# Patient Record
Sex: Female | Born: 1970 | Race: White | Hispanic: No | Smoking: Former smoker
Health system: Southern US, Community
[De-identification: ages and names within clinical notes are randomized; demographics above are authoritative.]

## PROBLEM LIST (undated history)

## (undated) DIAGNOSIS — F419 Anxiety disorder, unspecified: Secondary | ICD-10-CM

## (undated) DIAGNOSIS — Z8619 Personal history of other infectious and parasitic diseases: Secondary | ICD-10-CM

## (undated) DIAGNOSIS — M1990 Unspecified osteoarthritis, unspecified site: Secondary | ICD-10-CM

## (undated) DIAGNOSIS — T4145XA Adverse effect of unspecified anesthetic, initial encounter: Secondary | ICD-10-CM

## (undated) DIAGNOSIS — K219 Gastro-esophageal reflux disease without esophagitis: Secondary | ICD-10-CM

## (undated) DIAGNOSIS — D649 Anemia, unspecified: Secondary | ICD-10-CM

## (undated) DIAGNOSIS — N3090 Cystitis, unspecified without hematuria: Secondary | ICD-10-CM

## (undated) DIAGNOSIS — R87619 Unspecified abnormal cytological findings in specimens from cervix uteri: Secondary | ICD-10-CM

## (undated) DIAGNOSIS — K635 Polyp of colon: Secondary | ICD-10-CM

## (undated) DIAGNOSIS — K449 Diaphragmatic hernia without obstruction or gangrene: Secondary | ICD-10-CM

## (undated) DIAGNOSIS — G519 Disorder of facial nerve, unspecified: Secondary | ICD-10-CM

## (undated) DIAGNOSIS — T8859XA Other complications of anesthesia, initial encounter: Secondary | ICD-10-CM

## (undated) DIAGNOSIS — J189 Pneumonia, unspecified organism: Secondary | ICD-10-CM

## (undated) HISTORY — DX: Polyp of colon: K63.5

## (undated) HISTORY — DX: Cystitis, unspecified without hematuria: N30.90

## (undated) HISTORY — PX: LAPAROSCOPIC ABDOMINAL EXPLORATION: SHX6249

## (undated) HISTORY — DX: Anemia, unspecified: D64.9

## (undated) HISTORY — DX: Personal history of other infectious and parasitic diseases: Z86.19

## (undated) HISTORY — DX: Diaphragmatic hernia without obstruction or gangrene: K44.9

## (undated) HISTORY — PX: GYNECOLOGIC CRYOSURGERY: SHX857

## (undated) HISTORY — DX: Disorder of facial nerve, unspecified: G51.9

## (undated) HISTORY — DX: Unspecified abnormal cytological findings in specimens from cervix uteri: R87.619

---

## 2000-04-15 ENCOUNTER — Emergency Department (HOSPITAL_COMMUNITY): Admission: EM | Admit: 2000-04-15 | Discharge: 2000-04-16 | Payer: Self-pay | Admitting: Emergency Medicine

## 2003-02-20 ENCOUNTER — Emergency Department (HOSPITAL_COMMUNITY): Admission: EM | Admit: 2003-02-20 | Discharge: 2003-02-21 | Payer: Self-pay | Admitting: Emergency Medicine

## 2003-05-13 ENCOUNTER — Emergency Department (HOSPITAL_COMMUNITY): Admission: EM | Admit: 2003-05-13 | Discharge: 2003-05-13 | Payer: Self-pay | Admitting: Emergency Medicine

## 2003-05-13 ENCOUNTER — Encounter: Payer: Self-pay | Admitting: Emergency Medicine

## 2003-11-27 ENCOUNTER — Other Ambulatory Visit: Admission: RE | Admit: 2003-11-27 | Discharge: 2003-11-27 | Payer: Self-pay | Admitting: Obstetrics and Gynecology

## 2006-11-02 ENCOUNTER — Ambulatory Visit (HOSPITAL_COMMUNITY): Admission: RE | Admit: 2006-11-02 | Discharge: 2006-11-02 | Payer: Self-pay | Admitting: Family Medicine

## 2007-07-30 ENCOUNTER — Encounter: Admission: RE | Admit: 2007-07-30 | Discharge: 2007-07-30 | Payer: Self-pay | Admitting: Family Medicine

## 2007-10-29 ENCOUNTER — Encounter: Admission: RE | Admit: 2007-10-29 | Discharge: 2007-10-29 | Payer: Self-pay | Admitting: Otolaryngology

## 2008-10-03 ENCOUNTER — Ambulatory Visit: Payer: Self-pay | Admitting: Diagnostic Radiology

## 2008-10-03 ENCOUNTER — Emergency Department (HOSPITAL_BASED_OUTPATIENT_CLINIC_OR_DEPARTMENT_OTHER): Admission: EM | Admit: 2008-10-03 | Discharge: 2008-10-03 | Payer: Self-pay | Admitting: Emergency Medicine

## 2008-12-31 ENCOUNTER — Emergency Department (HOSPITAL_BASED_OUTPATIENT_CLINIC_OR_DEPARTMENT_OTHER): Admission: EM | Admit: 2008-12-31 | Discharge: 2009-01-01 | Payer: Self-pay | Admitting: Emergency Medicine

## 2010-04-11 ENCOUNTER — Emergency Department (HOSPITAL_BASED_OUTPATIENT_CLINIC_OR_DEPARTMENT_OTHER): Admission: EM | Admit: 2010-04-11 | Discharge: 2010-04-11 | Payer: Self-pay | Admitting: Emergency Medicine

## 2010-08-18 ENCOUNTER — Encounter: Payer: Self-pay | Admitting: Family Medicine

## 2010-10-10 LAB — URINALYSIS, ROUTINE W REFLEX MICROSCOPIC
Bilirubin Urine: NEGATIVE
Nitrite: NEGATIVE
Specific Gravity, Urine: 1.01 (ref 1.005–1.030)
Urobilinogen, UA: 0.2 mg/dL (ref 0.0–1.0)

## 2010-10-10 LAB — CBC
HCT: 39.4 % (ref 36.0–46.0)
MCHC: 35.7 g/dL (ref 30.0–36.0)
MCV: 90.4 fL (ref 78.0–100.0)
RDW: 12.1 % (ref 11.5–15.5)

## 2010-10-10 LAB — BASIC METABOLIC PANEL
BUN: 8 mg/dL (ref 6–23)
GFR calc non Af Amer: >60 mL/min (ref >60)
Glucose, Bld: 91 mg/dL (ref 70–99)
Potassium: 4.1 mEq/L (ref 3.5–5.1)

## 2010-10-10 LAB — DIFFERENTIAL
Basophils Absolute: 0.1 10*3/uL (ref 0.0–0.1)
Basophils Relative: 1 % (ref 0–1)
Eosinophils Absolute: 0.1 10*3/uL (ref 0.0–0.7)
Eosinophils Relative: 1 % (ref 0–5)
Monocytes Absolute: 0.4 10*3/uL (ref 0.1–1.0)

## 2010-10-10 LAB — PREGNANCY, URINE: Preg Test, Ur: NEGATIVE

## 2010-10-10 LAB — URINE MICROSCOPIC-ADD ON

## 2010-11-07 LAB — CBC
HCT: 38.8 % (ref 36.0–46.0)
MCV: 91.2 fL (ref 78.0–100.0)
RBC: 4.25 MIL/uL (ref 3.87–5.11)
WBC: 6.3 10*3/uL (ref 4.0–10.5)

## 2010-11-07 LAB — URINALYSIS, ROUTINE W REFLEX MICROSCOPIC
Bilirubin Urine: NEGATIVE
Protein, ur: NEGATIVE mg/dL
Urobilinogen, UA: 0.2 mg/dL (ref 0.0–1.0)

## 2010-11-07 LAB — WET PREP, GENITAL
Clue Cells Wet Prep HPF POC: NONE SEEN
Yeast Wet Prep HPF POC: NONE SEEN

## 2010-11-07 LAB — RPR: RPR Ser Ql: NONREACTIVE

## 2010-11-07 LAB — GC/CHLAMYDIA PROBE AMP, GENITAL: GC Probe Amp, Genital: NEGATIVE

## 2010-11-18 ENCOUNTER — Other Ambulatory Visit: Payer: Self-pay | Admitting: Family Medicine

## 2010-11-18 DIAGNOSIS — Z1231 Encounter for screening mammogram for malignant neoplasm of breast: Secondary | ICD-10-CM

## 2010-12-10 ENCOUNTER — Ambulatory Visit: Payer: Self-pay

## 2011-01-26 ENCOUNTER — Emergency Department (HOSPITAL_BASED_OUTPATIENT_CLINIC_OR_DEPARTMENT_OTHER)
Admission: EM | Admit: 2011-01-26 | Discharge: 2011-01-26 | Disposition: A | Discharge status: Home / Self Care | Payer: BC Managed Care – PPO | Attending: Emergency Medicine | Admitting: Emergency Medicine

## 2011-01-26 ENCOUNTER — Emergency Department (INDEPENDENT_AMBULATORY_CARE_PROVIDER_SITE_OTHER): Payer: BC Managed Care – PPO

## 2011-01-26 DIAGNOSIS — S93609A Unspecified sprain of unspecified foot, initial encounter: Secondary | ICD-10-CM | POA: Insufficient documentation

## 2011-01-26 DIAGNOSIS — X500XXA Overexertion from strenuous movement or load, initial encounter: Secondary | ICD-10-CM | POA: Insufficient documentation

## 2011-01-26 DIAGNOSIS — Y92009 Unspecified place in unspecified non-institutional (private) residence as the place of occurrence of the external cause: Secondary | ICD-10-CM | POA: Insufficient documentation

## 2011-01-26 DIAGNOSIS — M25579 Pain in unspecified ankle and joints of unspecified foot: Secondary | ICD-10-CM

## 2012-05-19 ENCOUNTER — Other Ambulatory Visit: Payer: Self-pay | Admitting: Family Medicine

## 2012-05-19 DIAGNOSIS — Z1231 Encounter for screening mammogram for malignant neoplasm of breast: Secondary | ICD-10-CM

## 2012-05-24 ENCOUNTER — Ambulatory Visit
Admission: RE | Admit: 2012-05-24 | Discharge: 2012-05-24 | Disposition: A | Discharge status: Home / Self Care | Payer: BC Managed Care – PPO | Source: Ambulatory Visit | Attending: Family Medicine | Admitting: Family Medicine

## 2012-05-24 ENCOUNTER — Other Ambulatory Visit: Payer: Self-pay | Admitting: Physician Assistant

## 2012-05-24 DIAGNOSIS — R9230 Dense breasts, unspecified: Secondary | ICD-10-CM

## 2012-05-24 DIAGNOSIS — Z1231 Encounter for screening mammogram for malignant neoplasm of breast: Secondary | ICD-10-CM

## 2012-05-24 DIAGNOSIS — IMO0002 Reserved for concepts with insufficient information to code with codable children: Secondary | ICD-10-CM

## 2012-05-24 DIAGNOSIS — R922 Inconclusive mammogram: Secondary | ICD-10-CM

## 2012-06-01 ENCOUNTER — Other Ambulatory Visit: Payer: Self-pay | Admitting: Physician Assistant

## 2012-06-01 ENCOUNTER — Ambulatory Visit
Admission: RE | Admit: 2012-06-01 | Discharge: 2012-06-01 | Disposition: A | Discharge status: Home / Self Care | Payer: BC Managed Care – PPO | Source: Ambulatory Visit | Attending: Physician Assistant | Admitting: Physician Assistant

## 2012-06-01 DIAGNOSIS — IMO0002 Reserved for concepts with insufficient information to code with codable children: Secondary | ICD-10-CM

## 2012-07-28 HISTORY — PX: BREAST BIOPSY: SHX20

## 2012-07-28 HISTORY — PX: CERVICAL DISCECTOMY: SHX98

## 2012-10-21 ENCOUNTER — Other Ambulatory Visit: Payer: Self-pay | Admitting: Family Medicine

## 2012-10-21 DIAGNOSIS — N6314 Unspecified lump in the right breast, lower inner quadrant: Secondary | ICD-10-CM

## 2012-11-03 ENCOUNTER — Ambulatory Visit
Admission: RE | Admit: 2012-11-03 | Discharge: 2012-11-03 | Disposition: A | Discharge status: Home / Self Care | Payer: BC Managed Care – PPO | Source: Ambulatory Visit | Attending: Family Medicine | Admitting: Family Medicine

## 2012-11-03 DIAGNOSIS — N6314 Unspecified lump in the right breast, lower inner quadrant: Secondary | ICD-10-CM

## 2012-11-08 ENCOUNTER — Other Ambulatory Visit: Payer: Self-pay | Admitting: Physical Medicine and Rehabilitation

## 2012-11-08 DIAGNOSIS — M542 Cervicalgia: Secondary | ICD-10-CM

## 2012-11-09 ENCOUNTER — Ambulatory Visit
Admission: RE | Admit: 2012-11-09 | Discharge: 2012-11-09 | Disposition: A | Discharge status: Home / Self Care | Payer: BC Managed Care – PPO | Source: Ambulatory Visit | Attending: Physical Medicine and Rehabilitation | Admitting: Physical Medicine and Rehabilitation

## 2012-11-09 DIAGNOSIS — M542 Cervicalgia: Secondary | ICD-10-CM

## 2013-05-05 ENCOUNTER — Other Ambulatory Visit: Payer: Self-pay | Admitting: Family Medicine

## 2013-05-05 DIAGNOSIS — Z1231 Encounter for screening mammogram for malignant neoplasm of breast: Secondary | ICD-10-CM

## 2013-05-23 ENCOUNTER — Ambulatory Visit: Payer: BC Managed Care – PPO

## 2013-06-05 ENCOUNTER — Emergency Department (HOSPITAL_BASED_OUTPATIENT_CLINIC_OR_DEPARTMENT_OTHER)
Admission: EM | Admit: 2013-06-05 | Discharge: 2013-06-05 | Disposition: A | Discharge status: Home / Self Care | Payer: BC Managed Care – PPO | Attending: Emergency Medicine | Admitting: Emergency Medicine

## 2013-06-05 ENCOUNTER — Encounter (HOSPITAL_BASED_OUTPATIENT_CLINIC_OR_DEPARTMENT_OTHER): Payer: Self-pay | Admitting: Emergency Medicine

## 2013-06-05 DIAGNOSIS — J3489 Other specified disorders of nose and nasal sinuses: Secondary | ICD-10-CM | POA: Insufficient documentation

## 2013-06-05 DIAGNOSIS — R059 Cough, unspecified: Secondary | ICD-10-CM | POA: Insufficient documentation

## 2013-06-05 DIAGNOSIS — H579 Unspecified disorder of eye and adnexa: Secondary | ICD-10-CM | POA: Insufficient documentation

## 2013-06-05 DIAGNOSIS — L309 Dermatitis, unspecified: Secondary | ICD-10-CM

## 2013-06-05 DIAGNOSIS — R509 Fever, unspecified: Secondary | ICD-10-CM | POA: Insufficient documentation

## 2013-06-05 DIAGNOSIS — F411 Generalized anxiety disorder: Secondary | ICD-10-CM | POA: Insufficient documentation

## 2013-06-05 DIAGNOSIS — M542 Cervicalgia: Secondary | ICD-10-CM | POA: Insufficient documentation

## 2013-06-05 DIAGNOSIS — L259 Unspecified contact dermatitis, unspecified cause: Secondary | ICD-10-CM | POA: Insufficient documentation

## 2013-06-05 DIAGNOSIS — F17200 Nicotine dependence, unspecified, uncomplicated: Secondary | ICD-10-CM | POA: Insufficient documentation

## 2013-06-05 DIAGNOSIS — R110 Nausea: Secondary | ICD-10-CM | POA: Insufficient documentation

## 2013-06-05 DIAGNOSIS — R05 Cough: Secondary | ICD-10-CM | POA: Insufficient documentation

## 2013-06-05 DIAGNOSIS — K219 Gastro-esophageal reflux disease without esophagitis: Secondary | ICD-10-CM | POA: Insufficient documentation

## 2013-06-05 DIAGNOSIS — R51 Headache: Secondary | ICD-10-CM | POA: Insufficient documentation

## 2013-06-05 HISTORY — DX: Anxiety disorder, unspecified: F41.9

## 2013-06-05 MED ORDER — PREDNISONE 20 MG PO TABS
40.0000 mg | ORAL_TABLET | Freq: Once | ORAL | Status: AC
  Administered 2013-06-05: 40 mg via ORAL
  Filled 2013-06-05: qty 2

## 2013-06-05 MED ORDER — PREDNISONE 10 MG PO TABS: 40.0000 mg | ORAL_TABLET | Freq: Every day | ORAL | Status: DC

## 2013-06-05 NOTE — ED Notes (Signed)
Onset Friday of rash to her face and chest.  Cortisone cream, Benadryl is not helping with the itch.  Denies environmental or dietary changes.  No respiratory distress noted.

## 2013-06-05 NOTE — ED Notes (Signed)
MD at bedside. 

## 2013-06-05 NOTE — ED Provider Notes (Signed)
CSN: [id number]     Arrival date & time 06/05/13  1416 History  This chart was scribed for Shelda Jakes, MD by Dorothey Baseman, ED Scribe. This patient was seen in room MH07/MH07 and the patient's care was started at 3:42 PM.    Chief Complaint  Patient presents with  . Rash   Patient is a 42 y.o. female presenting with rash. The history is provided by the patient. No language interpreter was used.  Rash Location:  Face, torso and head/neck Head/neck rash location:  L neck and R neck Facial rash location:  Face Torso rash location:  L chest and R chest Quality: itchiness   Quality: not blistering   Severity:  Moderate Onset quality:  Sudden Timing:  Constant Progression:  Unchanged Chronicity:  New Context: not eggs, not food, not new detergent/soap and not nuts   Relieved by:  Nothing Ineffective treatments:  Antihistamines and anti-itch cream (Benadryl, cortisone cream) Associated symptoms: fever ( subjective), headaches and nausea   Associated symptoms: no abdominal pain, no diarrhea, no shortness of breath, no sore throat and not vomiting   Fever:    Timing:  Sporadic   Progression:  Partially resolved  HPI Comments: Brenda Ray is a 42 y.o. female who presents to the Emergency Department complaining of an itching rash to the face and chest onset 2 days ago. Patient reports taking Benadryl and applying cortisone cream to the affected areas without relief. Patient reports associated chills, subjective fever, eye dryness/itchiness, mild intermittent nausea, and headache. Patient reports some recent seasonal allergies including cough and rhinorrhea. She denies any recent changes in at-home products or diet. Patient reports some neck pain secondary to a recent cervical discectomy. She denies confusion, sore throat, chest pain, shortness of breath, abdominal pain, emesis, or diarrhea, dysuria, hematuria, and back pain. Patient reports an allergy to doxycyline and adverse reactions to  oral prednisone. She denies history of hematologic problems. She denies any other pertinent medical history.  PCP- Dr. Dewain Penning   Past Medical History  Diagnosis Date  . Reflux   . Anxiety    Past Surgical History  Procedure Laterality Date  . Cervical discectomy     No family history on file. History  Substance Use Topics  . Smoking status: Current Every Day Smoker  . Smokeless tobacco: Not on file  . Alcohol Use: Yes     Comment: rarely   OB History   Grav Para Term Preterm Abortions TAB SAB Ect Mult Living                 Review of Systems  Constitutional: Positive for fever ( subjective) and chills.  HENT: Positive for rhinorrhea. Negative for sore throat.   Eyes: Positive for itching.  Respiratory: Positive for cough. Negative for shortness of breath.   Cardiovascular: Negative for chest pain.  Gastrointestinal: Positive for nausea. Negative for vomiting, abdominal pain and diarrhea.  Genitourinary: Negative for dysuria and hematuria.  Musculoskeletal: Positive for neck pain. Negative for back pain.  Skin: Positive for rash.  Allergic/Immunologic: Positive for environmental allergies.  Neurological: Positive for headaches.  Psychiatric/Behavioral: Negative for confusion.    Allergies  Doxycycline  Home Medications   Current Outpatient Rx  Name  Route  Sig  Dispense  Refill  . ALPRAZolam (XANAX) 0.25 MG tablet   Oral   Take 0.25 mg by mouth at bedtime as needed for anxiety.         . pantoprazole (PROTONIX) 40 MG  tablet   Oral   Take 40 mg by mouth daily.         . predniSONE (DELTASONE) 10 MG tablet   Oral   Take 4 tablets (40 mg total) by mouth daily.   20 tablet   0    Triage Vitals: BP 117/65  Temp(Src) 98.6 F (37 C) (Oral)  Resp 16  Ht 5\' 6"  (1.676 m)  Wt 160 lb (72.576 kg)  BMI 25.84 kg/m2  SpO2 99%  LMP 05/22/2013  Physical Exam  Nursing note and vitals reviewed. Constitutional: She is oriented to person, place, and time.  She appears well-developed and well-nourished. No distress.  HENT:  Head: Normocephalic and atraumatic.  Mouth/Throat: Oropharynx is clear and moist.  Eyes: Conjunctivae and EOM are normal. No scleral icterus.  Neck: Normal range of motion. Neck supple.  Cardiovascular: Normal rate, regular rhythm and normal heart sounds.   Pulmonary/Chest: Effort normal and breath sounds normal. No respiratory distress. She has no wheezes. She has no rales.  Abdominal: Soft. Bowel sounds are normal. She exhibits no distension. There is no tenderness.  Musculoskeletal: Normal range of motion.  Neurological: She is alert and oriented to person, place, and time. No cranial nerve deficit. She exhibits normal muscle tone. Coordination normal.  Skin: Skin is warm and dry. Rash noted.  Maculopapular, erythematous rash predominately on the left cheek and chin and is less pronounced on the right side of the face. Similar rash on the upper back.   Psychiatric: She has a normal mood and affect. Her behavior is normal.    ED Course  Procedures (including critical care time)  DIAGNOSTIC STUDIES: Oxygen Saturation is 99% on room air, normal by my interpretation.    COORDINATION OF CARE: 3:44 PM- Advised patient to discontinue using the cortisone cream on the face, but that it is fine to use on the chest and neck. Advised patient to continue using Benadryl at home. Discussed that steroids will be the most effective treatment for her current symptoms and will discharge patient with prednisone to manage symptoms. Discussed treatment plan with patient at bedside and patient verbalized agreement.     Labs Review Labs Reviewed - No data to display Imaging Review No results found.  EKG Interpretation   None       MDM   1. Dermatitis    Patient nontoxic no acute distress. Patient does have some subtle and mild systemic symptoms mentioned feeling of fever chills little bit of congestion some nausea. Patient's  rash seems to be a macular papular rash no vesicles it is bilateral so not consistent with zoster not consistent with a classic contact dermatitis. Patient has had side effects with prednisone in the past makes her jittery she doesn't like the feel however she has agreed to try a lower dose. Patient has primary care Dr. to followup with if not improved in a few days or return here for any new or worse symptoms.  I personally performed the services described in this documentation, which was scribed in my presence. The recorded information has been reviewed and is accurate.      Shelda Jakes, MD 06/05/13 [phone]

## 2013-06-22 ENCOUNTER — Ambulatory Visit: Payer: BC Managed Care – PPO

## 2013-07-20 ENCOUNTER — Ambulatory Visit
Admission: RE | Admit: 2013-07-20 | Discharge: 2013-07-20 | Disposition: A | Discharge status: Home / Self Care | Payer: BC Managed Care – PPO | Source: Ambulatory Visit | Attending: Family Medicine | Admitting: Family Medicine

## 2013-07-20 DIAGNOSIS — Z1231 Encounter for screening mammogram for malignant neoplasm of breast: Secondary | ICD-10-CM

## 2013-07-28 HISTORY — PX: BREAST BIOPSY: SHX20

## 2013-08-01 ENCOUNTER — Other Ambulatory Visit: Payer: Self-pay | Admitting: Family Medicine

## 2013-08-01 DIAGNOSIS — R928 Other abnormal and inconclusive findings on diagnostic imaging of breast: Secondary | ICD-10-CM

## 2013-08-09 ENCOUNTER — Other Ambulatory Visit: Payer: Self-pay | Admitting: Family Medicine

## 2013-08-09 ENCOUNTER — Ambulatory Visit
Admission: RE | Admit: 2013-08-09 | Discharge: 2013-08-09 | Disposition: A | Discharge status: Home / Self Care | Payer: BC Managed Care – PPO | Source: Ambulatory Visit | Attending: Family Medicine | Admitting: Family Medicine

## 2013-08-09 DIAGNOSIS — R921 Mammographic calcification found on diagnostic imaging of breast: Secondary | ICD-10-CM

## 2013-08-09 DIAGNOSIS — R928 Other abnormal and inconclusive findings on diagnostic imaging of breast: Secondary | ICD-10-CM

## 2013-08-12 ENCOUNTER — Ambulatory Visit
Admission: RE | Admit: 2013-08-12 | Discharge: 2013-08-12 | Disposition: A | Discharge status: Home / Self Care | Payer: BC Managed Care – PPO | Source: Ambulatory Visit | Attending: Family Medicine | Admitting: Family Medicine

## 2013-08-12 DIAGNOSIS — R921 Mammographic calcification found on diagnostic imaging of breast: Secondary | ICD-10-CM

## 2014-03-01 ENCOUNTER — Encounter (HOSPITAL_BASED_OUTPATIENT_CLINIC_OR_DEPARTMENT_OTHER): Payer: Self-pay | Admitting: Emergency Medicine

## 2014-03-01 ENCOUNTER — Emergency Department (HOSPITAL_BASED_OUTPATIENT_CLINIC_OR_DEPARTMENT_OTHER)
Admission: EM | Admit: 2014-03-01 | Discharge: 2014-03-01 | Disposition: A | Discharge status: Home / Self Care | Payer: BC Managed Care – PPO | Attending: Emergency Medicine | Admitting: Emergency Medicine

## 2014-03-01 DIAGNOSIS — Z79899 Other long term (current) drug therapy: Secondary | ICD-10-CM | POA: Insufficient documentation

## 2014-03-01 DIAGNOSIS — K219 Gastro-esophageal reflux disease without esophagitis: Secondary | ICD-10-CM | POA: Insufficient documentation

## 2014-03-01 DIAGNOSIS — F411 Generalized anxiety disorder: Secondary | ICD-10-CM | POA: Insufficient documentation

## 2014-03-01 DIAGNOSIS — R51 Headache: Secondary | ICD-10-CM | POA: Insufficient documentation

## 2014-03-01 DIAGNOSIS — F17200 Nicotine dependence, unspecified, uncomplicated: Secondary | ICD-10-CM | POA: Insufficient documentation

## 2014-03-01 DIAGNOSIS — J3489 Other specified disorders of nose and nasal sinuses: Secondary | ICD-10-CM | POA: Insufficient documentation

## 2014-03-01 DIAGNOSIS — J069 Acute upper respiratory infection, unspecified: Secondary | ICD-10-CM | POA: Insufficient documentation

## 2014-03-01 DIAGNOSIS — J0100 Acute maxillary sinusitis, unspecified: Secondary | ICD-10-CM | POA: Insufficient documentation

## 2014-03-01 DIAGNOSIS — IMO0002 Reserved for concepts with insufficient information to code with codable children: Secondary | ICD-10-CM | POA: Insufficient documentation

## 2014-03-01 MED ORDER — PSEUDOEPHEDRINE HCL 60 MG PO TABS: 60.0000 mg | ORAL_TABLET | Freq: Three times a day (TID) | ORAL | Status: DC | PRN

## 2014-03-01 MED ORDER — OXYMETAZOLINE HCL 0.05 % NA SOLN: 1.0000 | Freq: Two times a day (BID) | NASAL | Status: DC

## 2014-03-01 MED ORDER — AZITHROMYCIN 250 MG PO TABS: ORAL_TABLET | ORAL | Status: DC

## 2014-03-01 NOTE — ED Provider Notes (Signed)
Medical screening examination/treatment/procedure(s) were performed by non-physician practitioner and as supervising physician I was immediately available for consultation/collaboration.   EKG Interpretation None        Virgel Haro, MD 03/01/14 2357 

## 2014-03-01 NOTE — ED Provider Notes (Signed)
CSN: [id number]     Arrival date & time 03/01/14  1802 History   First MD Initiated Contact with Patient 03/01/14 1820     Chief Complaint  Patient presents with  . URI     (Consider location/radiation/quality/duration/timing/severity/associated sxs/prior Treatment) HPI Comments: Patient presents with a five-day history of sinus pain and pressure, congestion and runny nose radiating to her right ear. She also has had sore throat and occasional cough. No fevers, nausea, or vomiting. Patient has a history of sinus infections but not for over a year. She has been doing nasal saltwater sprays and Flonase and oral allergy medication without much relief. Onset of symptoms gradual. Course is constant. Nothing makes symptoms worse.  Patient is a 43 y.o. female presenting with URI. The history is provided by the patient.  URI Presenting symptoms: congestion, ear pain, rhinorrhea and sore throat   Presenting symptoms: no cough, no fatigue and no fever   Associated symptoms: headaches   Associated symptoms: no myalgias and no wheezing     Past Medical History  Diagnosis Date  . Reflux   . Anxiety    Past Surgical History  Procedure Laterality Date  . Cervical discectomy     No family history on file. History  Substance Use Topics  . Smoking status: Current Every Day Smoker    Types: Cigarettes  . Smokeless tobacco: Never Used  . Alcohol Use: Yes     Comment: rarely   OB History   Grav Para Term Preterm Abortions TAB SAB Ect Mult Living                 Review of Systems  Constitutional: Negative for fever, chills and fatigue.  HENT: Positive for congestion, ear pain, rhinorrhea, sinus pressure and sore throat. Negative for ear discharge.   Eyes: Negative for redness.  Respiratory: Negative for cough and wheezing.   Gastrointestinal: Negative for nausea, vomiting, abdominal pain and diarrhea.  Genitourinary: Negative for dysuria.  Musculoskeletal: Negative for myalgias and neck  stiffness.  Skin: Negative for rash.  Neurological: Positive for headaches.  Hematological: Negative for adenopathy.      Allergies  Doxycycline  Home Medications   Prior to Admission medications   Medication Sig Start Date End Date Taking? Authorizing Provider  ALPRAZolam Prudy Feeler(XANAX) 0.25 MG tablet Take 0.25 mg by mouth at bedtime as needed for anxiety.   Yes Historical Provider, MD  pantoprazole (PROTONIX) 40 MG tablet Take 40 mg by mouth daily.   Yes Historical Provider, MD  azithromycin (ZITHROMAX Z-PAK) 250 MG tablet Take two tablets PO on day 1 and one tablet PO days 2-5 03/01/14   Renne CriglerJoshua Kariah Loredo, PA-C  oxymetazoline (AFRIN NASAL SPRAY) 0.05 % nasal spray Place 1 spray into both nostrils 2 (two) times daily. 03/01/14   Renne CriglerJoshua Sidni Fusco, PA-C  predniSONE (DELTASONE) 10 MG tablet Take 4 tablets (40 mg total) by mouth daily. 06/05/13   Vanetta MuldersScott Zackowski, MD  pseudoephedrine (SUDAFED) 60 MG tablet Take 1 tablet (60 mg total) by mouth every 8 (eight) hours as needed for congestion. 03/01/14   Renne CriglerJoshua Montavis Schubring, PA-C   BP 105/73  Pulse 90  Temp(Src) 98.4 F (36.9 C) (Oral)  Resp 18  Ht 5\' 6"  (1.676 m)  Wt 150 lb (68.04 kg)  BMI 24.22 kg/m2  SpO2 100% Physical Exam  Nursing note and vitals reviewed. Constitutional: She appears well-developed and well-nourished.  HENT:  Head: Normocephalic and atraumatic.  Right Ear: Tympanic membrane, external ear and ear canal normal.  Left Ear: Tympanic membrane, external ear and ear canal normal.  Nose: Mucosal edema and rhinorrhea present. Right sinus exhibits maxillary sinus tenderness. Right sinus exhibits no frontal sinus tenderness. Left sinus exhibits maxillary sinus tenderness. Left sinus exhibits no frontal sinus tenderness.  Mouth/Throat: Uvula is midline, oropharynx is clear and moist and mucous membranes are normal. Mucous membranes are not dry. No oral lesions. No trismus in the jaw. No uvula swelling. No oropharyngeal exudate, posterior oropharyngeal  edema, posterior oropharyngeal erythema or tonsillar abscesses.  Eyes: Conjunctivae are normal. Right eye exhibits no discharge. Left eye exhibits no discharge.  Neck: Normal range of motion. Neck supple.  Cardiovascular: Normal rate, regular rhythm and normal heart sounds.   Pulmonary/Chest: Effort normal and breath sounds normal. No respiratory distress. She has no wheezes. She has no rales.  Abdominal: Soft. There is no tenderness.  Lymphadenopathy:    She has no cervical adenopathy.  Neurological: She is alert.  Skin: Skin is warm and dry.  Psychiatric: She has a normal mood and affect.    ED Course  Procedures (including critical care time) Labs Review Labs Reviewed - No data to display  Imaging Review No results found.   EKG Interpretation None      6:46 PM Patient seen and examined.   Vital signs reviewed and are as follows: Filed Vitals:   03/01/14 1809  BP: 105/73  Pulse: 90  Temp: 98.4 F (36.9 C)  Resp: 18   6:46 PM Patient counseled on supportive care for viral sinusitis and s/s to return including worsening symptoms, persistent fever, persistent vomiting, or if they have any other concerns. Urged to see PCP if symptoms persist for more than 3 days. I have given rx for azithromycin which patient was instructed to fill and take if symptoms persist for more than 72 hours with conservative management. Patient verbalizes understanding and agrees with plan.     MDM   Final diagnoses:  Acute maxillary sinusitis, recurrence not specified   Patient with acute sinusitis, uncomplicated, x 5 days. No fever. No oral symptoms. Conservative management with abx if symptoms persist more than 72 additional hours. Patient counseled that hopefully symptoms will resolve in the next day or 2.    Renne Crigler, PA-C 03/01/14 [phone]

## 2014-03-01 NOTE — Discharge Instructions (Signed)
Please read and follow all provided instructions.  Your diagnoses today include:  1. Acute maxillary sinusitis, recurrence not specified    You appear to have an upper respiratory infection (URI). An upper respiratory tract infection, or cold, is a viral infection of the air passages leading to the lungs. It should improve gradually after 5-7 days.  Tests performed today include:  Vital signs. See below for your results today.   Medications prescribed:   Oxymetazoline - nasal spray for congestion. Do not use for more than 3 days because this medicine can cause rebound congestion.    Sudafed - medication for sinus congestion   Azithromycin - antibiotic for respiratory infection  You have been prescribed an antibiotic medicine: take the entire course of medicine even if you are feeling better. Stopping early can cause the antibiotic not to work.  Please fill and begin taking if you continue to have symptoms in 72 hours.   Take any prescribed medications only as directed. Treatment for your infection is aimed at treating the symptoms. There are no medications, such as antibiotics, that will cure your infection.   Home care instructions:  Follow any educational materials contained in this packet.   Your illness is contagious and can be spread to others, especially during the first 3 or 4 days. It cannot be cured by antibiotics or other medicines. Take basic precautions such as washing your hands often, covering your mouth when you cough or sneeze, and avoiding public places where you could spread your illness to others.   Please continue drinking plenty of fluids.  Use over-the-counter medicines as needed as directed on packaging for symptom relief.  You may also use ibuprofen or tylenol as directed on packaging for pain or fever.  Do not take multiple medicines containing Tylenol or acetaminophen to avoid taking too much of this medication.  Follow-up instructions: Please follow-up  with your primary care provider in the next 3 days for further evaluation of your symptoms if you are not feeling better.   Return instructions:   Please return to the Emergency Department if you experience worsening symptoms.   RETURN IMMEDIATELY IF you develop shortness of breath, confusion or altered mental status, a new rash, become dizzy, faint, or poorly responsive, or are unable to be cared for at home.  Please return if you have persistent vomiting and cannot keep down fluids or develop a fever that is not controlled by tylenol or motrin.    Please return if you have any other emergent concerns.  Additional Information:  Your vital signs today were: BP 105/73   Pulse 90   Temp(Src) 98.4 F (36.9 C) (Oral)   Resp 18   Ht 5\' 6"  (1.676 m)   Wt 150 lb (68.04 kg)   BMI 24.22 kg/m2   SpO2 100% If your blood pressure (BP) was elevated above 135/85 this visit, please have this repeated by your doctor within one month. --------------

## 2014-03-01 NOTE — ED Notes (Signed)
Patient states she has a five day history of sinus drainage and congestion.  Now has right ear pain, sore throat, sinus pressure and facial pain.

## 2014-04-22 ENCOUNTER — Emergency Department (HOSPITAL_BASED_OUTPATIENT_CLINIC_OR_DEPARTMENT_OTHER)
Admission: EM | Admit: 2014-04-22 | Discharge: 2014-04-22 | Disposition: A | Discharge status: Home / Self Care | Payer: BC Managed Care – PPO | Attending: Emergency Medicine | Admitting: Emergency Medicine

## 2014-04-22 ENCOUNTER — Encounter (HOSPITAL_BASED_OUTPATIENT_CLINIC_OR_DEPARTMENT_OTHER): Payer: Self-pay | Admitting: Emergency Medicine

## 2014-04-22 DIAGNOSIS — K219 Gastro-esophageal reflux disease without esophagitis: Secondary | ICD-10-CM | POA: Diagnosis not present

## 2014-04-22 DIAGNOSIS — Z79899 Other long term (current) drug therapy: Secondary | ICD-10-CM | POA: Insufficient documentation

## 2014-04-22 DIAGNOSIS — F411 Generalized anxiety disorder: Secondary | ICD-10-CM | POA: Diagnosis not present

## 2014-04-22 DIAGNOSIS — F17200 Nicotine dependence, unspecified, uncomplicated: Secondary | ICD-10-CM | POA: Diagnosis not present

## 2014-04-22 DIAGNOSIS — IMO0002 Reserved for concepts with insufficient information to code with codable children: Secondary | ICD-10-CM | POA: Diagnosis not present

## 2014-04-22 MED ORDER — SUCRALFATE 1 GM/10ML PO SUSP: 1.0000 g | Freq: Three times a day (TID) | ORAL | Status: DC

## 2014-04-22 MED ORDER — GI COCKTAIL ~~LOC~~
ORAL | Status: AC
  Administered 2014-04-22: 30 mL
  Filled 2014-04-22: qty 30

## 2014-04-22 NOTE — ED Notes (Signed)
Pt c/o acid reflux x2hrs, states out of her GI cocktail, states took mylanta and protonix  with no relief

## 2014-04-22 NOTE — ED Provider Notes (Signed)
CSN: [id number]     Arrival date & time 04/22/14  0225 History   First MD Initiated Contact with Patient 04/22/14 0310     Chief Complaint  Patient presents with  . Gastrophageal Reflux     (Consider location/radiation/quality/duration/timing/severity/associated sxs/prior Treatment) Patient is a 43 y.o. female presenting with GERD.  Gastrophageal Reflux This is a recurrent problem. The current episode started 3 to 5 hours ago. The problem occurs constantly. The problem has not changed since onset.Associated symptoms include abdominal pain. Pertinent negatives include no headaches and no shortness of breath. The symptoms are aggravated by eating. Nothing relieves the symptoms. Treatments tried: protonix. The treatment provided no relief.  Ate chicken and biscuits and burning and pressure in upper abdomen epigastrum started no DOE not CP no SOB  Past Medical History  Diagnosis Date  . Reflux   . Anxiety    Past Surgical History  Procedure Laterality Date  . Cervical discectomy     No family history on file. History  Substance Use Topics  . Smoking status: Current Every Day Smoker    Types: Cigarettes  . Smokeless tobacco: Never Used  . Alcohol Use: Yes     Comment: rarely   OB History   Grav Para Term Preterm Abortions TAB SAB Ect Mult Living                 Review of Systems  Respiratory: Negative for shortness of breath.   Gastrointestinal: Positive for abdominal pain.  Neurological: Negative for headaches.  All other systems reviewed and are negative.     Allergies  Doxycycline  Home Medications   Prior to Admission medications   Medication Sig Start Date End Date Taking? Authorizing Provider  ALPRAZolam Prudy Feeler) 0.25 MG tablet Take 0.25 mg by mouth at bedtime as needed for anxiety.    Historical Provider, MD  azithromycin (ZITHROMAX Z-PAK) 250 MG tablet Take two tablets PO on day 1 and one tablet PO days 2-5 03/01/14   Renne Crigler, PA-C  oxymetazoline (AFRIN  NASAL SPRAY) 0.05 % nasal spray Place 1 spray into both nostrils 2 (two) times daily. 03/01/14   Renne Crigler, PA-C  pantoprazole (PROTONIX) 40 MG tablet Take 40 mg by mouth daily.    Historical Provider, MD  predniSONE (DELTASONE) 10 MG tablet Take 4 tablets (40 mg total) by mouth daily. 06/05/13   Vanetta Mulders, MD  pseudoephedrine (SUDAFED) 60 MG tablet Take 1 tablet (60 mg total) by mouth every 8 (eight) hours as needed for congestion. 03/01/14   Renne Crigler, PA-C  sucralfate (CARAFATE) 1 GM/10ML suspension Take 10 mLs (1 g total) by mouth 4 (four) times daily -  with meals and at bedtime. 04/22/14   Tanaisha Pittman K Keydi Giel-Rasch, MD   BP 129/70  Pulse 78  Temp(Src) 98.3 F (36.8 C) (Oral)  Resp 20  Ht  (1.676 m)  Wt 149 lb (67.586 kg)  BMI 24.06 kg/m2  SpO2 100%  LMP 04/15/2014 Physical Exam  Constitutional: She is oriented to person, place, and time. She appears well-developed and well-nourished. No distress.  HENT:  Head: Normocephalic and atraumatic.  Mouth/Throat: Oropharynx is clear and moist.  Eyes: Conjunctivae are normal. Pupils are equal, round, and reactive to light.  Neck: Normal range of motion. Neck supple.  Cardiovascular: Normal rate, regular rhythm and intact distal pulses.   Pulmonary/Chest: Effort normal and breath sounds normal. She has no wheezes. She has no rales.  Abdominal: Soft. Bowel sounds are increased. There is no  tenderness. There is no rebound and no guarding.    Musculoskeletal: Normal range of motion.  Neurological: She is alert and oriented to person, place, and time.  Skin: Skin is warm and dry.  Psychiatric: She has a normal mood and affect.    ED Course  Procedures (including critical care time) Labs Review Labs Reviewed - No data to display  Imaging Review No results found.   EKG Interpretation   Date/Time:  Saturday April 22 2014 03:04:52 EDT Ventricular Rate:  65 PR Interval:  160 QRS Duration: 92 QT Interval:  416 QTC  Calculation: 432 R Axis:   69 Text Interpretation:  Normal sinus rhythm Confirmed by [facility]  MD,  Radley Teston ([id number]) on 04/22/2014 3:11:28 AM      MDM   Final diagnoses:  GERD without esophagitis    Clearly gerd and symptoms resolved post GI cocktail.  Will prescribe carafate.  Follow up with your GI specialist    Kimm Sider K Ahliyah Nienow-Rasch, MD 04/22/14 [phone]

## 2014-05-12 ENCOUNTER — Encounter (HOSPITAL_BASED_OUTPATIENT_CLINIC_OR_DEPARTMENT_OTHER): Payer: Self-pay | Admitting: Emergency Medicine

## 2014-05-12 ENCOUNTER — Emergency Department (HOSPITAL_BASED_OUTPATIENT_CLINIC_OR_DEPARTMENT_OTHER)
Admission: EM | Admit: 2014-05-12 | Discharge: 2014-05-13 | Disposition: A | Discharge status: Home / Self Care | Payer: BC Managed Care – PPO | Attending: Emergency Medicine | Admitting: Emergency Medicine

## 2014-05-12 DIAGNOSIS — F419 Anxiety disorder, unspecified: Secondary | ICD-10-CM | POA: Diagnosis not present

## 2014-05-12 DIAGNOSIS — K219 Gastro-esophageal reflux disease without esophagitis: Secondary | ICD-10-CM | POA: Insufficient documentation

## 2014-05-12 DIAGNOSIS — R1011 Right upper quadrant pain: Secondary | ICD-10-CM | POA: Diagnosis present

## 2014-05-12 DIAGNOSIS — Z720 Tobacco use: Secondary | ICD-10-CM | POA: Insufficient documentation

## 2014-05-12 DIAGNOSIS — Z3202 Encounter for pregnancy test, result negative: Secondary | ICD-10-CM | POA: Insufficient documentation

## 2014-05-12 DIAGNOSIS — K8050 Calculus of bile duct without cholangitis or cholecystitis without obstruction: Secondary | ICD-10-CM | POA: Diagnosis not present

## 2014-05-12 DIAGNOSIS — Z79899 Other long term (current) drug therapy: Secondary | ICD-10-CM | POA: Insufficient documentation

## 2014-05-12 LAB — URINE MICROSCOPIC-ADD ON

## 2014-05-12 LAB — URINALYSIS, ROUTINE W REFLEX MICROSCOPIC
Bilirubin Urine: NEGATIVE
Glucose, UA: NEGATIVE mg/dL
Ketones, ur: NEGATIVE mg/dL
LEUKOCYTES UA: NEGATIVE
NITRITE: NEGATIVE
Protein, ur: NEGATIVE mg/dL
SPECIFIC GRAVITY, URINE: 1.01 (ref 1.005–1.030)
UROBILINOGEN UA: 0.2 mg/dL (ref 0.0–1.0)
pH: 8 (ref 5.0–8.0)

## 2014-05-12 LAB — PREGNANCY, URINE: Preg Test, Ur: NEGATIVE

## 2014-05-12 NOTE — ED Provider Notes (Signed)
CSN: [id number]     Arrival date & time 05/12/14  2304 History  This chart was scribed for Brenda Seamen, MD by Elon Spanner, ED Scribe. This patient was seen in room MH05/MH05 and the patient's care was started at 12:00 AM.   Chief Complaint  Patient presents with  . Abdominal Pain   The history is provided by the patient. No language interpreter was used.   HPI Comments: Brenda Ray is a 43 y.o. female who presents to the Emergency Department complaining of improving RUQ abdominal pain described as cramping with radiation to around to the back onset 1.5 hours ago. It was severe at its worst as soon after onset. Symptoms are mild now. Pain is worse with movement or palpation. She had some mild diarrhea earlier today. Patient denies nausea or vomiting. Patient reports she was seen approximately 3 weeks ago for a similar complaint and diagnosed with peptic ulcer disease. She reports being given a GI cocktail which effectively reduced her pain and she was placed on Protonix.   Past Medical History  Diagnosis Date  . Reflux   . Anxiety    Past Surgical History  Procedure Laterality Date  . Cervical discectomy     History reviewed. No pertinent family history. History  Substance Use Topics  . Smoking status: Current Every Day Smoker    Types: Cigarettes  . Smokeless tobacco: Never Used  . Alcohol Use: Yes     Comment: rarely   OB History   Grav Para Term Preterm Abortions TAB SAB Ect Mult Living                 Review of Systems A complete 10 system review of systems was obtained and all systems are negative except as noted in the HPI and PMH.   Allergies  Doxycycline  Home Medications   Prior to Admission medications   Medication Sig Start Date End Date Taking? Authorizing Provider  ALPRAZolam Prudy Feeler) 0.25 MG tablet Take 0.25 mg by mouth at bedtime as needed for anxiety.    Historical Provider, MD  pantoprazole (PROTONIX) 40 MG tablet Take 40 mg by mouth daily.     Historical Provider, MD   BP 103/51  Pulse 80  Temp(Src) 98.1 F (36.7 C) (Oral)  Resp 16  Ht 5\' 6"  (1.676 m)  Wt 150 lb (68.04 kg)  BMI 24.22 kg/m2  SpO2 97%  LMP 04/15/2014 Physical Exam  Nursing note and vitals reviewed.  General: Well-developed, well-nourished female in no acute distress; appearance consistent with age of record HENT: normocephalic; atraumatic Eyes: pupils equal, round and reactive to light; extraocular muscles intact Neck: supple Heart: regular rate and rhythm; no murmurs, rubs or gallops Lungs: clear to auscultation bilaterally Abdomen: soft; nondistended; epigastric and right upper quadrant tenderness; no masses or hepatosplenomegaly; gallstones seen on bedside US:   Extremities: No deformity; full range of motion; pulses normal Neurologic: Awake, alert and oriented; motor function intact in all extremities and symmetric; no facial droop Skin: Warm and dry Psychiatric: Normal mood and affect  ED Course  Procedures (including critical care time)  DIAGNOSTIC STUDIES: Oxygen Saturation is 100% on RA, normal by my interpretation.    COORDINATION OF CARE:  1:20 AM Discussed treatment plan with patient at bedside which includes abdominal US as an outpatient.  Patient acknowledges and agrees with plan.     MDM   Nursing notes and vitals signs, including pulse oximetry, reviewed.  Summary of this visit's results, reviewed by  myself:  Labs:  Results for orders placed during the hospital encounter of 05/12/14 (from the past 24 hour(s))  URINALYSIS, ROUTINE W REFLEX MICROSCOPIC     Status: Abnormal   Collection Time    05/12/14 11:25 PM      Result Value Ref Range   Color, Urine YELLOW  YELLOW   APPearance CLOUDY (*) CLEAR   Specific Gravity, Urine 1.010  1.005 - 1.030   pH 8.0  5.0 - 8.0   Glucose, UA NEGATIVE  NEGATIVE mg/dL   Hgb urine dipstick SMALL (*) NEGATIVE   Bilirubin Urine NEGATIVE  NEGATIVE   Ketones, ur NEGATIVE  NEGATIVE mg/dL    Protein, ur NEGATIVE  NEGATIVE mg/dL   Urobilinogen, UA 0.2  0.0 - 1.0 mg/dL   Nitrite NEGATIVE  NEGATIVE   Leukocytes, UA NEGATIVE  NEGATIVE  PREGNANCY, URINE     Status: None   Collection Time    05/12/14 11:25 PM      Result Value Ref Range   Preg Test, Ur NEGATIVE  NEGATIVE  URINE MICROSCOPIC-ADD ON     Status: Abnormal   Collection Time    05/12/14 11:25 PM      Result Value Ref Range   Squamous Epithelial / LPF FEW (*) RARE   WBC, UA 0-2  <3 WBC/hpf   RBC / HPF 3-6  <3 RBC/hpf   Bacteria, UA RARE  RARE   Urine-Other AMORPHOUS URATES/PHOSPHATES    LIPASE, BLOOD     Status: None   Collection Time    05/13/14 12:30 AM      Result Value Ref Range   Lipase 36  11 - 59 U/L  COMPREHENSIVE METABOLIC PANEL     Status: Abnormal   Collection Time    05/13/14 12:30 AM      Result Value Ref Range   Sodium 140  137 - 147 mEq/L   Potassium 4.0  3.7 - 5.3 mEq/L   Chloride 101  96 - 112 mEq/L   CO2 28  19 - 32 mEq/L   Glucose, Bld 107 (*) 70 - 99 mg/dL   BUN 9  6 - 23 mg/dL   Creatinine, Ser 1.610.80  0.50 - 1.10 mg/dL   Calcium 9.6  8.4 - 09.610.5 mg/dL   Total Protein 6.8  6.0 - 8.3 g/dL   Albumin 3.9  3.5 - 5.2 g/dL   AST 91 (*) 0 - 37 U/L   ALT 39 (*) 0 - 35 U/L   Alkaline Phosphatase 63  39 - 117 U/L   Total Bilirubin 0.7  0.3 - 1.2 mg/dL   GFR calc non Af Amer 88 (*) >90 mL/min   GFR calc Af Amer >90  >90 mL/min   Anion gap 11  5 - 15  CBC WITH DIFFERENTIAL     Status: Abnormal   Collection Time    05/13/14 12:30 AM      Result Value Ref Range   WBC 11.4 (*) 4.0 - 10.5 K/uL   RBC 4.24  3.87 - 5.11 MIL/uL   Hemoglobin 13.3  12.0 - 15.0 g/dL   HCT 04.537.6  40.936.0 - 81.146.0 %   MCV 88.7  78.0 - 100.0 fL   MCH 31.4  26.0 - 34.0 pg   MCHC 35.4  30.0 - 36.0 g/dL   RDW 91.412.1  78.211.5 - 95.615.5 %   Platelets 179  150 - 400 K/uL   Neutrophils Relative % 80 (*) 43 - 77 %   Neutro  Abs 9.1 (*) 1.7 - 7.7 K/uL   Lymphocytes Relative 14  12 - 46 %   Lymphs Abs 1.6  0.7 - 4.0 K/uL   Monocytes  Relative 5  3 - 12 %   Monocytes Absolute 0.6  0.1 - 1.0 K/uL   Eosinophils Relative 1  0 - 5 %   Eosinophils Absolute 0.1  0.0 - 0.7 K/uL   Basophils Relative 0  0 - 1 %   Basophils Absolute 0.0  0.0 - 0.1 K/uL   1:21 AM Patient complains of nausea but pain is still mild. We will have her return for ultrasound later today.  I personally performed the services described in this documentation, which was scribed in my presence. The recorded information has been reviewed and is accurate.   Brenda SeamenJohn L Maribeth Jiles, MD 05/13/14 [phone]

## 2014-05-12 NOTE — ED Notes (Signed)
Pt reports abdominal cramping since 3pm today.  Denies N/V.

## 2014-05-13 ENCOUNTER — Other Ambulatory Visit (HOSPITAL_BASED_OUTPATIENT_CLINIC_OR_DEPARTMENT_OTHER): Payer: Self-pay | Admitting: Emergency Medicine

## 2014-05-13 ENCOUNTER — Ambulatory Visit (HOSPITAL_BASED_OUTPATIENT_CLINIC_OR_DEPARTMENT_OTHER)
Admit: 2014-05-13 | Discharge: 2014-05-13 | Disposition: A | Discharge status: Home / Self Care | Payer: BC Managed Care – PPO | Source: Ambulatory Visit | Attending: Emergency Medicine | Admitting: Emergency Medicine

## 2014-05-13 DIAGNOSIS — K8020 Calculus of gallbladder without cholecystitis without obstruction: Secondary | ICD-10-CM | POA: Insufficient documentation

## 2014-05-13 DIAGNOSIS — R7989 Other specified abnormal findings of blood chemistry: Secondary | ICD-10-CM | POA: Diagnosis not present

## 2014-05-13 DIAGNOSIS — R1011 Right upper quadrant pain: Secondary | ICD-10-CM | POA: Diagnosis present

## 2014-05-13 DIAGNOSIS — R52 Pain, unspecified: Secondary | ICD-10-CM

## 2014-05-13 LAB — COMPREHENSIVE METABOLIC PANEL
ALBUMIN: 3.9 g/dL (ref 3.5–5.2)
ALK PHOS: 63 U/L (ref 39–117)
ALT: 39 U/L — ABNORMAL HIGH (ref 0–35)
AST: 91 U/L — ABNORMAL HIGH (ref 0–37)
Anion gap: 11 (ref 5–15)
BUN: 9 mg/dL (ref 6–23)
CHLORIDE: 101 meq/L (ref 96–112)
CO2: 28 mEq/L (ref 19–32)
Calcium: 9.6 mg/dL (ref 8.4–10.5)
Creatinine, Ser: 0.8 mg/dL (ref 0.50–1.10)
GFR calc Af Amer: >90 mL/min (ref >90)
GFR calc non Af Amer: 88 mL/min — ABNORMAL LOW (ref >90)
Glucose, Bld: 107 mg/dL — ABNORMAL HIGH (ref 70–99)
POTASSIUM: 4 meq/L (ref 3.7–5.3)
Sodium: 140 mEq/L (ref 137–147)
Total Bilirubin: 0.7 mg/dL (ref 0.3–1.2)
Total Protein: 6.8 g/dL (ref 6.0–8.3)

## 2014-05-13 LAB — CBC WITH DIFFERENTIAL/PLATELET
BASOS PCT: 0 % (ref 0–1)
Basophils Absolute: 0 10*3/uL (ref 0.0–0.1)
Eosinophils Absolute: 0.1 10*3/uL (ref 0.0–0.7)
Eosinophils Relative: 1 % (ref 0–5)
HEMATOCRIT: 37.6 % (ref 36.0–46.0)
Hemoglobin: 13.3 g/dL (ref 12.0–15.0)
Lymphocytes Relative: 14 % (ref 12–46)
Lymphs Abs: 1.6 10*3/uL (ref 0.7–4.0)
MCH: 31.4 pg (ref 26.0–34.0)
MCHC: 35.4 g/dL (ref 30.0–36.0)
MCV: 88.7 fL (ref 78.0–100.0)
MONO ABS: 0.6 10*3/uL (ref 0.1–1.0)
Monocytes Relative: 5 % (ref 3–12)
Neutro Abs: 9.1 10*3/uL — ABNORMAL HIGH (ref 1.7–7.7)
Neutrophils Relative %: 80 % — ABNORMAL HIGH (ref 43–77)
Platelets: 179 10*3/uL (ref 150–400)
RBC: 4.24 MIL/uL (ref 3.87–5.11)
RDW: 12.1 % (ref 11.5–15.5)
WBC: 11.4 10*3/uL — ABNORMAL HIGH (ref 4.0–10.5)

## 2014-05-13 LAB — LIPASE, BLOOD: Lipase: 36 U/L (ref 11–59)

## 2014-05-13 MED ORDER — ONDANSETRON 8 MG PO TBDP: 8.0000 mg | ORAL_TABLET | Freq: Three times a day (TID) | ORAL | Status: DC | PRN

## 2014-05-13 MED ORDER — HYDROCODONE-ACETAMINOPHEN 5-325 MG PO TABS: 1.0000 | ORAL_TABLET | Freq: Four times a day (QID) | ORAL | Status: DC | PRN

## 2014-05-13 MED ORDER — ONDANSETRON HCL 4 MG/2ML IJ SOLN
4.0000 mg | Freq: Once | INTRAMUSCULAR | Status: AC
  Administered 2014-05-13: 4 mg via INTRAVENOUS
  Filled 2014-05-13: qty 2

## 2014-05-13 NOTE — ED Notes (Signed)
Radiology staff scheduled pt for an US today at 2pm.  Pt agreeable with this plan.

## 2014-05-13 NOTE — Discharge Instructions (Signed)
Biliary Colic  °Biliary colic is a steady or irregular pain in the upper abdomen. It is usually under the right side of the rib cage. It happens when gallstones interfere with the normal flow of bile from the gallbladder. Bile is a liquid that helps to digest fats. Bile is made in the liver and stored in the gallbladder. When you eat a meal, bile passes from the gallbladder through the cystic duct and the common bile duct into the small intestine. There, it mixes with partially digested food. If a gallstone blocks either of these ducts, the normal flow of bile is blocked. The muscle cells in the bile duct contract forcefully to try to move the stone. This causes the pain of biliary colic.  °SYMPTOMS  °· A person with biliary colic usually complains of pain in the upper abdomen. This pain can be: °¨ In the center of the upper abdomen just below the breastbone. °¨ In the upper-right part of the abdomen, near the gallbladder and liver. °¨ Spread back toward the right shoulder blade. °· Nausea and vomiting. °· The pain usually occurs after eating. °· Biliary colic is usually triggered by the digestive system's demand for bile. The demand for bile is high after fatty meals. Symptoms can also occur when a person who has been fasting suddenly eats a very large meal. Most episodes of biliary colic pass after 1 to 5 hours. After the most intense pain passes, your abdomen may continue to ache mildly for about 24 hours. °DIAGNOSIS  °After you describe your symptoms, your caregiver will perform a physical exam. He or she will pay attention to the upper right portion of your belly (abdomen). This is the area of your liver and gallbladder. An ultrasound will help your caregiver look for gallstones. Specialized scans of the gallbladder may also be done. Blood tests may be done, especially if you have fever or if your pain persists. °PREVENTION  °Biliary colic can be prevented by controlling the risk factors for gallstones. Some of  these risk factors, such as heredity, increasing age, and pregnancy are a normal part of life. Obesity and a high-fat diet are risk factors you can change through a healthy lifestyle. Women going through menopause who take hormone replacement therapy (estrogen) are also more likely to develop biliary colic. °TREATMENT  °· Pain medication may be prescribed. °· You may be encouraged to eat a fat-free diet. °· If the first episode of biliary colic is severe, or episodes of colic keep retuning, surgery to remove the gallbladder (cholecystectomy) is usually recommended. This procedure can be done through small incisions using an instrument called a laparoscope. The procedure often requires a brief stay in the hospital. Some people can leave the hospital the same day. It is the most widely used treatment in people troubled by painful gallstones. It is effective and safe, with no complications in more than 90% of cases. °· If surgery cannot be done, medication that dissolves gallstones may be used. This medication is expensive and can take months or years to work. Only small stones will dissolve. °· Rarely, medication to dissolve gallstones is combined with a procedure called shock-wave lithotripsy. This procedure uses carefully aimed shock waves to break up gallstones. In many people treated with this procedure, gallstones form again within a few years. °PROGNOSIS  °If gallstones block your cystic duct or common bile duct, you are at risk for repeated episodes of biliary colic. There is also a 25% chance that you will develop   a gallbladder infection(acute cholecystitis), or some other complication of gallstones within 10 to 20 years. If you have surgery, schedule it at a time that is convenient for you and at a time when you are not sick. °HOME CARE INSTRUCTIONS  °· Drink plenty of clear fluids. °· Avoid fatty, greasy or fried foods, or any foods that make your pain worse. °· Take medications as directed. °SEEK MEDICAL  CARE IF:  °· You develop a fever over 100.5° F (38.1° C). °· Your pain gets worse over time. °· You develop nausea that prevents you from eating and drinking. °· You develop vomiting. °SEEK IMMEDIATE MEDICAL CARE IF:  °· You have continuous or severe belly (abdominal) pain which is not relieved with medications. °· You develop nausea and vomiting which is not relieved with medications. °· You have symptoms of biliary colic and you suddenly develop a fever and shaking chills. This may signal cholecystitis. Call your caregiver immediately. °· You develop a yellow color to your skin or the white part of your eyes (jaundice). °Document Released: 12/15/2005 Document Revised: 10/06/2011 Document Reviewed: 02/24/2008 °ExitCare® Patient Information ©2015 [employer]. This information is not intended to replace advice given to you by your health care provider. Make sure you discuss any questions you have with your health care provider. ° °

## 2014-05-13 NOTE — ED Provider Notes (Signed)
Patient return for outpatient ultrasound. I was approached by the ultrasound tech with the results. It shows cholelithiasis. However, no signs of acute cholecystitis. Her pain is well controlled today. I  reiterated her followup plan which is to [facility] for [facility], bland low-fat diet, when necessary medications, ER with acute changes, fever, vomiting, jaundice.  Rolland PorterMark Cedricka Sackrider, MD 05/13/14 [phone]

## 2014-05-29 ENCOUNTER — Other Ambulatory Visit (INDEPENDENT_AMBULATORY_CARE_PROVIDER_SITE_OTHER): Payer: Self-pay | Admitting: Surgery

## 2014-06-05 ENCOUNTER — Telehealth (INDEPENDENT_AMBULATORY_CARE_PROVIDER_SITE_OTHER): Payer: Self-pay | Admitting: Surgery

## 2014-06-05 NOTE — Telephone Encounter (Signed)
I spoke to her boyfriend on the phone.  He did not give me his name.  Ms. Brenda Ray is having another gall bladder attack.  It had only been going on for 15 minutes.  She is going to take the pain meds and nausea meds that she has.  If she is no better, she will go to the ER.  Otherwise, she will check with the office in the AM about moving her gall bladder surgery up.  Brenda Kinavid Jesseka Drinkard, MD, [facility] Pager: [phone] Office phone:  [phone]

## 2014-06-06 NOTE — Pre-Procedure Instructions (Signed)
Brenda Ray M Owens  06/06/2014   Your procedure is scheduled on:  Friday, Nov. 20th   [facility].  Call this number if you have problems the morning of surgery: [phone]   Remember:   Do not eat food or drink liquids after midnight Thursday.    Take these medicines the morning of surgery with A SIP OF WATER: Hydrocodone, Zofran (if needed)   Do not wear jewelry, make-up or nail polish.  Do not wear lotions, powders, or perfumes. You may NOT wear deodorant the morning of surgery.  Do not shave underarms & legs 48 hours prior to surgery.    Do not bring valuables to the hospital.  [facility] for any belongings or valuables.               Contacts, dentures or bridgework may not be worn into surgery.  Leave suitcase in the car. After surgery it may be brought to your room.  For patients admitted to the hospital, discharge time is determined by your treatment team.               Patients discharged the day of surgery will not be allowed to drive home.   Name and phone number of your driver:    Special Instructions: "Preparing for Surgery" instruction sheet.   Please read over the following fact sheets that you were given: Pain Booklet, Coughing and Deep Breathing and Surgical Site Infection Prevention

## 2014-06-07 ENCOUNTER — Encounter (HOSPITAL_COMMUNITY): Payer: Self-pay

## 2014-06-07 ENCOUNTER — Encounter (HOSPITAL_COMMUNITY)
Admission: RE | Admit: 2014-06-07 | Discharge: 2014-06-07 | Disposition: A | Discharge status: Home / Self Care | Payer: BC Managed Care – PPO | Source: Ambulatory Visit | Attending: Surgery | Admitting: Surgery

## 2014-06-07 DIAGNOSIS — Z01812 Encounter for preprocedural laboratory examination: Secondary | ICD-10-CM | POA: Diagnosis present

## 2014-06-07 DIAGNOSIS — K8020 Calculus of gallbladder without cholecystitis without obstruction: Secondary | ICD-10-CM | POA: Diagnosis not present

## 2014-06-07 HISTORY — DX: Unspecified osteoarthritis, unspecified site: M19.90

## 2014-06-07 HISTORY — DX: Gastro-esophageal reflux disease without esophagitis: K21.9

## 2014-06-07 HISTORY — DX: Adverse effect of unspecified anesthetic, initial encounter: T41.45XA

## 2014-06-07 HISTORY — DX: Other complications of anesthesia, initial encounter: T88.59XA

## 2014-06-07 HISTORY — DX: Pneumonia, unspecified organism: J18.9

## 2014-06-07 LAB — BASIC METABOLIC PANEL
ANION GAP: 11 (ref 5–15)
BUN: 5 mg/dL — ABNORMAL LOW (ref 6–23)
CHLORIDE: 101 meq/L (ref 96–112)
CO2: 26 meq/L (ref 19–32)
CREATININE: 0.84 mg/dL (ref 0.50–1.10)
Calcium: 9.3 mg/dL (ref 8.4–10.5)
GFR calc Af Amer: >90 mL/min (ref >90)
GFR calc non Af Amer: 83 mL/min — ABNORMAL LOW (ref >90)
Glucose, Bld: 97 mg/dL (ref 70–99)
POTASSIUM: 4.3 meq/L (ref 3.7–5.3)
Sodium: 138 mEq/L (ref 137–147)

## 2014-06-07 LAB — CBC
HCT: 42.5 % (ref 36.0–46.0)
HEMOGLOBIN: 15 g/dL (ref 12.0–15.0)
MCH: 31.7 pg (ref 26.0–34.0)
MCHC: 35.3 g/dL (ref 30.0–36.0)
MCV: 89.9 fL (ref 78.0–100.0)
Platelets: 225 10*3/uL (ref 150–400)
RBC: 4.73 MIL/uL (ref 3.87–5.11)
RDW: 12.5 % (ref 11.5–15.5)
WBC: 6 10*3/uL (ref 4.0–10.5)

## 2014-06-07 LAB — HCG, SERUM, QUALITATIVE: Preg, Serum: NEGATIVE

## 2014-06-15 MED ORDER — CEFAZOLIN SODIUM-DEXTROSE 2-3 GM-% IV SOLR
2.0000 g | INTRAVENOUS | Status: AC
  Administered 2014-06-16: 2 g via INTRAVENOUS
  Filled 2014-06-15: qty 50

## 2014-06-15 NOTE — H&P (Signed)
Brenda Ray 05/29/2014 11:10 AM Location: [facility] Patient #: [id number] DOB: Jan 27, 1971 Single / Language: [place] / Race: White Female  History of Present Illness (Rogen Porte A. Magnus IvanBlackman MD; 05/29/2014 11:26 AM) Patient words: eval gallbladder.  The patient is a 43 year old female who presents for evaluation of gall stones. She is referred by the emergency department for symptomatic cholelithiasis. For the last 2 months, she is having attacks of right upper quadrant abdominal pain hurting through to the back with nausea but no vomiting. The pain is moderate to severe. She denies jaundice. She has had some diarrhea. The pain is described as sharp. She is otherwise without complaints.   Other Problems Lamar Laundry(Sonya Bynum, CMA; 05/29/2014 11:11 AM) Cholelithiasis Gastroesophageal Reflux Disease  Diagnostic Studies History Gilmer Mor(Sonya Bynum, CMA; 05/29/2014 11:11 AM) Colonoscopy >10 years ago Mammogram within last year  Allergies Gilmer Mor(Sonya Bynum, CMA; 05/29/2014 11:11 AM) Doxycycline *DERMATOLOGICALS*  Medication History (Sonya Bynum, CMA; 05/29/2014 11:15 AM) No Current Medications  Social History Gilmer Mor(Sonya Bynum, CMA; 05/29/2014 11:11 AM) No drug use  Family History Gilmer Mor(Sonya Bynum, CMA; 05/29/2014 11:11 AM) Alcohol Abuse Father. Anesthetic complications Mother. Arthritis Father, Mother. Colon Polyps Mother. Depression Brother. Hypertension Brother. Respiratory Condition Father. Thyroid problems Brother.  Pregnancy / Birth History Gilmer Mor(Sonya Bynum, CMA; 05/29/2014 11:11 AM) Age at menarche 9 years. Gravida 4 Irregular periods Maternal age 321-25 Para 2  Review of Systems Lamar Laundry(Sonya Bynum CMA; 05/29/2014 11:11 AM) General Present- Appetite Loss and Weight Loss. Not Present- Chills, Fatigue, Fever, Night Sweats and Weight Gain. Skin Not Present- Change in Wart/Mole, Dryness, Hives, Jaundice, New Lesions, Non-Healing Wounds, Rash and Ulcer. HEENT Present- Seasonal Allergies.  Not Present- Earache, Hearing Loss, Hoarseness, Nose Bleed, Oral Ulcers, Ringing in the Ears, Sinus Pain, Sore Throat, Visual Disturbances, Wears glasses/contact lenses and Yellow Eyes. Cardiovascular Not Present- Chest Pain, Difficulty Breathing Lying Down, Leg Cramps, Palpitations, Rapid Heart Rate, Shortness of Breath and Swelling of Extremities. Gastrointestinal Present- Abdominal Pain. Not Present- Bloating, Bloody Stool, Change in Bowel Habits, Chronic diarrhea, Constipation, Difficulty Swallowing, Excessive gas, Gets full quickly at meals, Hemorrhoids, Indigestion, Nausea, Rectal Pain and Vomiting. Neurological Not Present- Decreased Memory, Fainting, Headaches, Numbness, Seizures, Tingling, Tremor, Trouble walking and Weakness. Psychiatric Present- Anxiety. Not Present- Bipolar, Change in Sleep Pattern, Depression, Fearful and Frequent crying.   Vitals (Sonya Bynum CMA; 05/29/2014 11:14 AM) 05/29/2014 11:12 AM Weight: 153 lb Temp.: 97.74F(Temporal)  Pulse: 78 (Regular)  BP: 136/82 (Sitting, Left Arm, Standard)    Physical Exam (Jsoeph Podesta A. Magnus IvanBlackman MD; 05/29/2014 11:26 AM) General Mental Status-Alert. General Appearance-Consistent with stated age. Hydration-Well hydrated. Voice-Normal.  Head and Neck Head-normocephalic, atraumatic with no lesions or palpable masses.  Eye Eyeball - Bilateral-Extraocular movements intact. Sclera/Conjunctiva - Bilateral-No scleral icterus.  Chest and Lung Exam Chest and lung exam reveals -quiet, even and easy respiratory effort with no use of accessory muscles and on auscultation, normal breath sounds, no adventitious sounds and normal vocal resonance. Inspection Chest Wall - Normal. Back - normal.  Cardiovascular Cardiovascular examination reveals -on palpation PMI is normal in location and amplitude, no palpable S3 or S4. Normal cardiac borders., normal heart sounds, regular rate and rhythm with no murmurs, carotid  auscultation reveals no bruits and normal pedal pulses bilaterally.  Abdomen Inspection Inspection of the abdomen reveals - No Hernias. Skin - Scar - no surgical scars. Palpation/Percussion Palpation and Percussion of the abdomen reveal - Soft, Non Tender, No Rebound tenderness, No Rigidity (guarding) and No hepatosplenomegaly. Auscultation Auscultation of the abdomen  reveals - Bowel sounds normal.  Neurologic Neurologic evaluation reveals -alert and oriented x 3 with no impairment of recent or remote memory. Mental Status-Normal.  Musculoskeletal Normal Exam - Left-Upper Extremity Strength Normal and Lower Extremity Strength Normal. Normal Exam - Right-Upper Extremity Strength Normal, Lower Extremity Weakness.    Assessment & Plan (Charrise Lardner A. Magnus IvanBlackman MD; 05/29/2014 11:27 AM) SYMPTOMATIC CHOLELITHIASIS (574.20  K80.20) Impression: I discussed the diagnosis with the patient in detail. Laparoscopic cholecystectomy with possible cholangiogram is recommended. I gave her literature regarding the surgery. I discussed the risks which includes but is not limited to bleeding, infection, bile duct injury, bile leak, injury to other structures, need to convert to an open procedure, DVT, cardiopulmonary issues, et Karie Sodacetera. I also discussed postoperative recovery. She understands and wishes to proceed

## 2014-06-16 ENCOUNTER — Encounter (HOSPITAL_COMMUNITY)
Admission: RE | Disposition: A | Discharge status: Home / Self Care | Payer: Self-pay | Source: Ambulatory Visit | Attending: Surgery

## 2014-06-16 ENCOUNTER — Ambulatory Visit (HOSPITAL_COMMUNITY)
Admission: RE | Admit: 2014-06-16 | Discharge: 2014-06-16 | Disposition: A | Discharge status: Home / Self Care | Payer: BC Managed Care – PPO | Source: Ambulatory Visit | Attending: Surgery | Admitting: Surgery

## 2014-06-16 ENCOUNTER — Ambulatory Visit (HOSPITAL_COMMUNITY): Payer: BC Managed Care – PPO | Admitting: Certified Registered"

## 2014-06-16 ENCOUNTER — Encounter (HOSPITAL_COMMUNITY): Payer: Self-pay | Admitting: *Deleted

## 2014-06-16 DIAGNOSIS — K8010 Calculus of gallbladder with chronic cholecystitis without obstruction: Secondary | ICD-10-CM | POA: Insufficient documentation

## 2014-06-16 DIAGNOSIS — K219 Gastro-esophageal reflux disease without esophagitis: Secondary | ICD-10-CM | POA: Insufficient documentation

## 2014-06-16 HISTORY — PX: CHOLECYSTECTOMY: SHX55

## 2014-06-16 SURGERY — LAPAROSCOPIC CHOLECYSTECTOMY WITH INTRAOPERATIVE CHOLANGIOGRAM
Anesthesia: General | Site: Abdomen

## 2014-06-16 MED ORDER — 0.9 % SODIUM CHLORIDE (POUR BTL) OPTIME
TOPICAL | Status: DC | PRN
  Administered 2014-06-16: 1000 mL

## 2014-06-16 MED ORDER — OXYCODONE-ACETAMINOPHEN 5-325 MG PO TABS: 1.0000 | ORAL_TABLET | ORAL | Status: DC | PRN

## 2014-06-16 MED ORDER — BUPIVACAINE-EPINEPHRINE (PF) 0.25% -1:200000 IJ SOLN
INTRAMUSCULAR | Status: AC
  Filled 2014-06-16: qty 30

## 2014-06-16 MED ORDER — MEPERIDINE HCL 25 MG/ML IJ SOLN: 6.2500 mg | INTRAMUSCULAR | Status: DC | PRN

## 2014-06-16 MED ORDER — HYDROMORPHONE HCL 1 MG/ML IJ SOLN
INTRAMUSCULAR | Status: AC
  Filled 2014-06-16: qty 1

## 2014-06-16 MED ORDER — PROPOFOL 10 MG/ML IV BOLUS
INTRAVENOUS | Status: DC | PRN
  Administered 2014-06-16: 150 mg via INTRAVENOUS

## 2014-06-16 MED ORDER — FENTANYL CITRATE 0.05 MG/ML IJ SOLN
INTRAMUSCULAR | Status: DC | PRN
  Administered 2014-06-16: 50 ug via INTRAVENOUS
  Administered 2014-06-16 (×2): 100 ug via INTRAVENOUS

## 2014-06-16 MED ORDER — DEXAMETHASONE SODIUM PHOSPHATE 10 MG/ML IJ SOLN
INTRAMUSCULAR | Status: AC
  Filled 2014-06-16: qty 1

## 2014-06-16 MED ORDER — HYDROMORPHONE HCL 1 MG/ML IJ SOLN: 0.2500 mg | INTRAMUSCULAR | Status: DC | PRN

## 2014-06-16 MED ORDER — MIDAZOLAM HCL 5 MG/5ML IJ SOLN
INTRAMUSCULAR | Status: DC | PRN
  Administered 2014-06-16: 2 mg via INTRAVENOUS

## 2014-06-16 MED ORDER — OXYCODONE HCL 5 MG/5ML PO SOLN: 5.0000 mg | Freq: Once | ORAL | Status: DC | PRN

## 2014-06-16 MED ORDER — MIDAZOLAM HCL 2 MG/2ML IJ SOLN
INTRAMUSCULAR | Status: AC
  Filled 2014-06-16: qty 2

## 2014-06-16 MED ORDER — NEOSTIGMINE METHYLSULFATE 10 MG/10ML IV SOLN
INTRAVENOUS | Status: DC | PRN
  Administered 2014-06-16: 3 mg via INTRAVENOUS

## 2014-06-16 MED ORDER — LIDOCAINE HCL (CARDIAC) 20 MG/ML IV SOLN
INTRAVENOUS | Status: DC | PRN
  Administered 2014-06-16: 20 mg via INTRAVENOUS

## 2014-06-16 MED ORDER — KETOROLAC TROMETHAMINE 30 MG/ML IJ SOLN
INTRAMUSCULAR | Status: AC
  Filled 2014-06-16: qty 1

## 2014-06-16 MED ORDER — PROMETHAZINE HCL 25 MG/ML IJ SOLN: 6.2500 mg | INTRAMUSCULAR | Status: DC | PRN

## 2014-06-16 MED ORDER — OXYCODONE HCL 5 MG PO TABS: 5.0000 mg | ORAL_TABLET | ORAL | Status: DC | PRN

## 2014-06-16 MED ORDER — FENTANYL CITRATE 0.05 MG/ML IJ SOLN
INTRAMUSCULAR | Status: AC
  Filled 2014-06-16: qty 5

## 2014-06-16 MED ORDER — ONDANSETRON HCL 4 MG/2ML IJ SOLN
INTRAMUSCULAR | Status: DC | PRN
  Administered 2014-06-16: 4 mg via INTRAVENOUS

## 2014-06-16 MED ORDER — SCOPOLAMINE 1 MG/3DAYS TD PT72
1.0000 | MEDICATED_PATCH | Freq: Once | TRANSDERMAL | Status: AC
  Administered 2014-06-16: 1 via TRANSDERMAL

## 2014-06-16 MED ORDER — SODIUM CHLORIDE 0.9 % IR SOLN
Status: DC | PRN
  Administered 2014-06-16: 1000 mL

## 2014-06-16 MED ORDER — DIPHENHYDRAMINE HCL 50 MG/ML IJ SOLN
10.0000 mg | Freq: Once | INTRAMUSCULAR | Status: AC
  Administered 2014-06-16: 10 mg via INTRAVENOUS

## 2014-06-16 MED ORDER — LACTATED RINGERS IV SOLN
INTRAVENOUS | Status: DC | PRN
  Administered 2014-06-16: 07:00:00 via INTRAVENOUS

## 2014-06-16 MED ORDER — PROPOFOL 10 MG/ML IV BOLUS
INTRAVENOUS | Status: AC
  Filled 2014-06-16: qty 20

## 2014-06-16 MED ORDER — HYDROMORPHONE HCL 1 MG/ML IJ SOLN
INTRAMUSCULAR | Status: DC | PRN
  Administered 2014-06-16 (×2): 1 mg via INTRAVENOUS

## 2014-06-16 MED ORDER — KETOROLAC TROMETHAMINE 30 MG/ML IJ SOLN
INTRAMUSCULAR | Status: DC | PRN
  Administered 2014-06-16: 30 mg via INTRAVENOUS

## 2014-06-16 MED ORDER — DIPHENHYDRAMINE HCL 50 MG/ML IJ SOLN
INTRAMUSCULAR | Status: AC
  Filled 2014-06-16: qty 1

## 2014-06-16 MED ORDER — OXYCODONE HCL 5 MG PO TABS: 5.0000 mg | ORAL_TABLET | Freq: Once | ORAL | Status: DC | PRN

## 2014-06-16 MED ORDER — GLYCOPYRROLATE 0.2 MG/ML IJ SOLN
INTRAMUSCULAR | Status: DC | PRN
  Administered 2014-06-16: 0.4 mg via INTRAVENOUS

## 2014-06-16 MED ORDER — VECURONIUM BROMIDE 10 MG IV SOLR
INTRAVENOUS | Status: DC | PRN
  Administered 2014-06-16: 2 mg via INTRAVENOUS

## 2014-06-16 MED ORDER — DEXAMETHASONE SODIUM PHOSPHATE 10 MG/ML IJ SOLN
INTRAMUSCULAR | Status: DC | PRN
  Administered 2014-06-16: 10 mg via INTRAVENOUS

## 2014-06-16 MED ORDER — MIDAZOLAM HCL 2 MG/2ML IJ SOLN: 0.5000 mg | Freq: Once | INTRAMUSCULAR | Status: DC | PRN

## 2014-06-16 MED ORDER — SUCCINYLCHOLINE CHLORIDE 20 MG/ML IJ SOLN
INTRAMUSCULAR | Status: DC | PRN
  Administered 2014-06-16: 120 mg via INTRAVENOUS

## 2014-06-16 MED ORDER — BUPIVACAINE-EPINEPHRINE 0.25% -1:200000 IJ SOLN
INTRAMUSCULAR | Status: DC | PRN
  Administered 2014-06-16: 30 mL

## 2014-06-16 MED ORDER — SCOPOLAMINE 1 MG/3DAYS TD PT72
MEDICATED_PATCH | TRANSDERMAL | Status: AC
  Filled 2014-06-16: qty 1

## 2014-06-16 SURGICAL SUPPLY — 38 items
APPLIER CLIP 5 13 M/L LIGAMAX5 (MISCELLANEOUS) ×2
BANDAGE ADH SHEER 1  50/CT (GAUZE/BANDAGES/DRESSINGS) ×8 IMPLANT
BENZOIN TINCTURE PRP APPL 2/3 (GAUZE/BANDAGES/DRESSINGS) IMPLANT
CANISTER SUCTION 2500CC (MISCELLANEOUS) ×2 IMPLANT
CHLORAPREP W/TINT 26ML (MISCELLANEOUS) ×2 IMPLANT
CLIP APPLIE 5 13 M/L LIGAMAX5 (MISCELLANEOUS) ×1 IMPLANT
COVER MAYO STAND STRL (DRAPES) IMPLANT
COVER SURGICAL LIGHT HANDLE (MISCELLANEOUS) ×2 IMPLANT
DRAPE C-ARM 42X72 X-RAY (DRAPES) IMPLANT
DRAPE LAPAROSCOPIC ABDOMINAL (DRAPES) ×2 IMPLANT
ELECT REM PT RETURN 9FT ADLT (ELECTROSURGICAL) ×2
ELECTRODE REM PT RTRN 9FT ADLT (ELECTROSURGICAL) ×1 IMPLANT
GLOVE BIOGEL PI IND STRL 7.0 (GLOVE) ×1 IMPLANT
GLOVE BIOGEL PI INDICATOR 7.0 (GLOVE) ×1
GLOVE SURG SIGNA 7.5 PF LTX (GLOVE) ×2 IMPLANT
GLOVE SURG SS PI 7.0 STRL IVOR (GLOVE) ×2 IMPLANT
GOWN STRL REUS W/ TWL LRG LVL3 (GOWN DISPOSABLE) ×2 IMPLANT
GOWN STRL REUS W/ TWL XL LVL3 (GOWN DISPOSABLE) ×1 IMPLANT
GOWN STRL REUS W/TWL LRG LVL3 (GOWN DISPOSABLE) ×2
GOWN STRL REUS W/TWL XL LVL3 (GOWN DISPOSABLE) ×1
KIT BASIN OR (CUSTOM PROCEDURE TRAY) ×2 IMPLANT
KIT ROOM TURNOVER OR (KITS) ×2 IMPLANT
NS IRRIG 1000ML POUR BTL (IV SOLUTION) ×2 IMPLANT
PAD ARMBOARD 7.5X6 YLW CONV (MISCELLANEOUS) ×2 IMPLANT
POUCH SPECIMEN RETRIEVAL 10MM (ENDOMECHANICALS) ×2 IMPLANT
SCISSORS LAP 5X35 DISP (ENDOMECHANICALS) ×2 IMPLANT
SET CHOLANGIOGRAPH 5 50 .035 (SET/KITS/TRAYS/PACK) IMPLANT
SET IRRIG TUBING LAPAROSCOPIC (IRRIGATION / IRRIGATOR) ×2 IMPLANT
SLEEVE ENDOPATH XCEL 5M (ENDOMECHANICALS) ×4 IMPLANT
SPECIMEN JAR SMALL (MISCELLANEOUS) ×2 IMPLANT
STRIP CLOSURE SKIN 1/2X4 (GAUZE/BANDAGES/DRESSINGS) ×2 IMPLANT
SUT MON AB 4-0 PC3 18 (SUTURE) ×2 IMPLANT
TOWEL OR 17X24 6PK STRL BLUE (TOWEL DISPOSABLE) IMPLANT
TOWEL OR 17X26 10 PK STRL BLUE (TOWEL DISPOSABLE) ×2 IMPLANT
TRAY LAPAROSCOPIC (CUSTOM PROCEDURE TRAY) ×2 IMPLANT
TROCAR XCEL BLUNT TIP 100MML (ENDOMECHANICALS) ×2 IMPLANT
TROCAR XCEL NON-BLD 5MMX100MML (ENDOMECHANICALS) ×2 IMPLANT
TUBING INSUFFLATION (TUBING) ×2 IMPLANT

## 2014-06-16 NOTE — Transfer of Care (Signed)
Immediate Anesthesia Transfer of Care Note  Patient: Brenda Ray  Procedure(s) Performed: Procedure(s) with comments: LAPAROSCOPIC CHOLECYSTECTOMY  (N/A) - laparoscopic cholecystectomy  Patient Location: PACU  Anesthesia Type:General  Level of Consciousness: awake, alert  and oriented  Airway & Oxygen Therapy: Patient Spontanous Breathing  Post-op Assessment: Report given to PACU RN and Post -op Vital signs reviewed and stable  Post vital signs: Reviewed and stable  Complications: No apparent anesthesia complications

## 2014-06-16 NOTE — Interval H&P Note (Signed)
History and Physical Interval Note:no change in H and P  06/16/2014 7:06 AM  Brenda Ray  has presented today for surgery, with the diagnosis of Cholelithiasis  The various methods of treatment have been discussed with the patient and family. After consideration of risks, benefits and other options for treatment, the patient has consented to  Procedure(s): LAPAROSCOPIC CHOLECYSTECTOMY WITH INTRAOPERATIVE CHOLANGIOGRAM (N/A) as a surgical intervention .  The patient's history has been reviewed, patient examined, no change in status, stable for surgery.  I have reviewed the patient's chart and labs.  Questions were answered to the patient's satisfaction.     Garnet Chatmon A

## 2014-06-16 NOTE — Progress Notes (Signed)
Waiting on Short stay to call back for report and give room assignment

## 2014-06-16 NOTE — Discharge Instructions (Signed)
[facility] ______[facility] LAPAROSCOPIC SURGERY: POST OP INSTRUCTIONS Always review your discharge instruction sheet given to you by the facility where your surgery was performed. IF YOU HAVE DISABILITY OR FAMILY LEAVE FORMS, YOU MUST BRING THEM TO THE OFFICE FOR PROCESSING.   DO NOT GIVE THEM TO YOUR DOCTOR.  1. A prescription for pain medication may be given to you upon discharge.  Take your pain medication as prescribed, if needed.  If narcotic pain medicine is not needed, then you may take acetaminophen (Tylenol) or ibuprofen (Advil) as needed. 2. Take your usually prescribed medications unless otherwise directed. 3. If you need a refill on your pain medication, please contact your pharmacy.  They will contact our office to request authorization. Prescriptions will not be filled after 5pm or on week-ends. 4. You should follow a light diet the first few days after arrival home, such as soup and crackers, etc.  Be sure to include lots of fluids daily. 5. Most patients will experience some swelling and bruising in the area of the incisions.  Ice packs will help.  Swelling and bruising can take several days to resolve.  6. It is common to experience some constipation if taking pain medication after surgery.  Increasing fluid intake and taking a stool softener (such as Colace) will usually help or prevent this problem from occurring.  A mild laxative (Milk of Magnesia or Miralax) should be taken according to package instructions if there are no bowel movements after 48 hours. 7. Unless discharge instructions indicate otherwise, you may remove your bandages 24-48 hours after surgery, and you may shower at that time.  You may have steri-strips (small skin tapes) in place directly over the incision.  These strips should be left on the skin for 7-10 days.  If your surgeon used skin glue on the incision, you may shower in 24 hours.  The glue will flake off over the next 2-3 weeks.  Any sutures or  staples will be removed at the office during your follow-up visit. 8. ACTIVITIES:  You may resume regular (light) daily activities beginning the next day--such as daily self-care, walking, climbing stairs--gradually increasing activities as tolerated.  You may have sexual intercourse when it is comfortable.  Refrain from any heavy lifting or straining until approved by your doctor. a. You may drive when you are no longer taking prescription pain medication, you can comfortably wear a seatbelt, and you can safely maneuver your car and apply brakes. b. RETURN TO WORK:  __MS. OWENS MAY RETURN TO WORK TO FULL ACTIVITY ON 11/23/2015________________________________________________________ 9. You should see your doctor in the office for a follow-up appointment approximately 2-3 weeks after your surgery.  Make sure that you call for this appointment within a day or two after you arrive home to insure a convenient appointment time. 10. OTHER INSTRUCTIONS: ____ice pack and ibuprofen also for  Pain. 11. ______________________________________________________________________________________________________________________ __________________________________________________________________________________________________________________________ WHEN TO CALL YOUR DOCTOR: 1. Fever over 101.0 2. Inability to urinate 3. Continued bleeding from incision. 4. Increased pain, redness, or drainage from the incision. 5. Increasing abdominal pain  The clinic staff is available to answer your questions during regular business hours.  Please dont hesitate to call and ask to speak to one of the nurses for clinical concerns.  If you have a medical emergency, go to the nearest emergency room or call 911.  [facility] is always on call at the hospital. [address], [address], [place], [place]  [address] ?  [id number], [place], [place]   [id number] [phone] ? [phone] ? FAX [phone]  [phone] Web site: [email or web address]         TO WHOM IT MAY CONCERN:  Ms. Kristopher Oppenheimonya Owens underwent surgery on 06/16/2014.  She is doing well and may return to work on 06/19/2014 without limits.  If you have any questions, please feel free to call.  Thank you,  Riley Lamouglas A. Magnus IvanBlackman, MD  What to eat:  For your first meals, you should eat lightly; only small meals initially.  If you do not have nausea, you may eat larger meals.  Avoid spicy, greasy and heavy food.    General Anesthesia, Adult, Care After  Refer to this sheet in the next few weeks. These instructions provide you with information on caring for yourself after your procedure. Your health care provider may also give you more specific instructions. Your treatment has been planned according to current medical practices, but problems sometimes occur. Call your health care provider if you have any problems or questions after your procedure.  WHAT TO EXPECT AFTER THE PROCEDURE  After the procedure, it is typical to experience:  Sleepiness.  Nausea and vomiting. HOME CARE INSTRUCTIONS  For the first 24 hours after general anesthesia:  Have a responsible person with you.  Do not drive a car. If you are alone, do not take public transportation.  Do not drink alcohol.  Do not take medicine that has not been prescribed by your health care provider.  Do not sign important papers or make important decisions.  You may resume a normal diet and activities as directed by your health care provider.  Change bandages (dressings) as directed.  If you have questions or problems that seem related to general anesthesia, call the hospital and ask for the anesthetist or anesthesiologist on call. SEEK MEDICAL CARE IF:  You have nausea and vomiting that continue the day after anesthesia.  You develop a rash. SEEK IMMEDIATE MEDICAL CARE IF:  You have difficulty breathing.  You have chest pain.  You have any allergic  problems. Document Released: 10/20/2000 Document Revised: 03/16/2013 Document Reviewed: 01/27/2013  [facility] Patient Information 2014 [place] [place].

## 2014-06-16 NOTE — Anesthesia Preprocedure Evaluation (Addendum)
Anesthesia Evaluation  Patient identified by MRN, date of birth, ID band  Reviewed: Allergy & Precautions, H&P , NPO status , Patient's Chart, lab work & pertinent test results  History of Anesthesia Complications Negative for: history of anesthetic complications  Airway Mallampati: II  TM Distance: >3 FB Neck ROM: Full    Dental  (+) Poor Dentition, Chipped, Dental Advisory Given   Pulmonary COPDCurrent Smoker,  breath sounds clear to auscultation        Cardiovascular negative cardio ROS  Rhythm:Regular Rate:Normal     Neuro/Psych negative neurological ROS     GI/Hepatic Neg liver ROS, GERD-  Controlled,  Endo/Other  negative endocrine ROS  Renal/GU negative Renal ROS     Musculoskeletal   Abdominal   Peds  Hematology negative hematology ROS (+)   Anesthesia Other Findings   Reproductive/Obstetrics 06/07/14 preg test NEG                            Anesthesia Physical Anesthesia Plan  ASA: II  Anesthesia Plan: General   Post-op Pain Management:    Induction: Intravenous  Airway Management Planned: Oral ETT  Additional Equipment:   Intra-op Plan:   Post-operative Plan: Extubation in OR  Informed Consent: I have reviewed the patients History and Physical, chart, labs and discussed the procedure including the risks, benefits and alternatives for the proposed anesthesia with the patient or authorized representative who has indicated his/her understanding and acceptance.   Dental advisory given  Plan Discussed with: Anesthesiologist, Surgeon and CRNA  Anesthesia Plan Comments: (Plan routine monitors, GETA)       Anesthesia Quick Evaluation

## 2014-06-16 NOTE — Anesthesia Postprocedure Evaluation (Signed)
  Anesthesia Post-op Note  Patient: Brenda Ray  Procedure(s) Performed: Procedure(s) with comments: LAPAROSCOPIC CHOLECYSTECTOMY  (N/A) - laparoscopic cholecystectomy  Patient Location: PACU  Anesthesia Type:General  Level of Consciousness: awake, alert , oriented and patient cooperative  Airway and Oxygen Therapy: Patient Spontanous Breathing  Post-op Pain: 2 /10, mild  Post-op Assessment: Post-op Vital signs reviewed, Patient's Cardiovascular Status Stable, Respiratory Function Stable, Patent Airway, No signs of Nausea or vomiting and Pain level controlled  Post-op Vital Signs: Reviewed and stable  Last Vitals:  Filed Vitals:   06/16/14 0923  BP:   Pulse:   Temp: 36.3 C  Resp:     Complications: No apparent anesthesia complications

## 2014-06-16 NOTE — Anesthesia Procedure Notes (Signed)
Procedure Name: Intubation Date/Time: 06/16/2014 7:23 AM Performed by: Arlice ColtMANESS, Vanita Cannell B Pre-anesthesia Checklist: Patient identified, Emergency Drugs available, Suction available, Patient being monitored and Timeout performed Patient Re-evaluated:Patient Re-evaluated prior to inductionOxygen Delivery Method: Circle system utilized Preoxygenation: Pre-oxygenation with 100% oxygen Intubation Type: IV induction and Rapid sequence Laryngoscope Size: Mac and 3 (DL times one by Fredricka Bonineonnor EMT student who was unable to view cords, DL times one by myself grade 2 view) Grade View: Grade II Tube type: Oral Tube size: 7.0 mm Number of attempts: 2 Airway Equipment and Method: Stylet Secured at: 21 cm Tube secured with: Tape Dental Injury: Injury to lip

## 2014-06-16 NOTE — Op Note (Signed)
Laparoscopic Cholecystectomy Procedure Note  Indications: This patient presents with symptomatic gallbladder disease and will undergo laparoscopic cholecystectomy.  Pre-operative Diagnosis: Calculus of gallbladder without mention of cholecystitis or obstruction  Post-operative Diagnosis: Same  Surgeon: Abigail MiyamotoBLACKMAN,Briyah Wheelwright A   Assistants: 0  Anesthesia: General endotracheal anesthesia  ASA Class: 2  Procedure Details  The patient was seen again in the Holding Room. The risks, benefits, complications, treatment options, and expected outcomes were discussed with the patient. The possibilities of reaction to medication, pulmonary aspiration, perforation of viscus, bleeding, recurrent infection, finding a normal gallbladder, the need for additional procedures, failure to diagnose a condition, the possible need to convert to an open procedure, and creating a complication requiring transfusion or operation were discussed with the patient. The likelihood of improving the patient's symptoms with return to their baseline status is good.  The patient and/or family concurred with the proposed plan, giving informed consent. The site of surgery properly noted. The patient was taken to Operating Room, identified as Brenda Ray and the procedure verified as Laparoscopic Cholecystectomy with Intraoperative Cholangiogram. A Time Out was held and the above information confirmed.  Prior to the induction of general anesthesia, antibiotic prophylaxis was administered. General endotracheal anesthesia was then administered and tolerated well. After the induction, the abdomen was prepped with Chloraprep and draped in sterile fashion. The patient was positioned in the supine position.  Local anesthetic agent was injected into the skin near the umbilicus and an incision made. We dissected down to the abdominal fascia with blunt dissection.  The fascia was incised vertically and we entered the peritoneal cavity bluntly.  A  pursestring suture of 0-Vicryl was placed around the fascial opening.  The Hasson cannula was inserted and secured with the stay suture.  Pneumoperitoneum was then created with CO2 and tolerated well without any adverse changes in the patient's vital signs. An 11-mm port was placed in the subxiphoid position.  Two 5-mm ports were placed in the right upper quadrant. All skin incisions were infiltrated with a local anesthetic agent before making the incision and placing the trocars.   We positioned the patient in reverse Trendelenburg, tilted slightly to the patient's left.  The gallbladder was identified, the fundus grasped and retracted cephalad. Adhesions were lysed bluntly and with the electrocautery where indicated, taking care not to injure any adjacent organs or viscus. The infundibulum was grasped and retracted laterally, exposing the peritoneum overlying the triangle of Calot. This was then divided and exposed in a blunt fashion. The cystic duct was clearly identified and bluntly dissected circumferentially. A critical view of the cystic duct and cystic artery was obtained.  The cystic duct was then ligated with clips and divided. The cystic artery was, dissected free, ligated with clips and divided as well.   The gallbladder was dissected from the liver bed in retrograde fashion with the electrocautery. The gallbladder was removed and placed in an Endocatch sac. The liver bed was irrigated and inspected. Hemostasis was achieved with the electrocautery. Copious irrigation was utilized and was repeatedly aspirated until clear.  The gallbladder and Endocatch sac were then removed through the umbilical port site.  The pursestring suture was used to close the umbilical fascia.    We again inspected the right upper quadrant for hemostasis.  Pneumoperitoneum was released as we removed the trocars.  4-0 Monocryl was used to close the skin.   Benzoin, steri-strips, and clean dressings were applied. The patient  was then extubated and brought to the recovery room  in stable condition. Instrument, sponge, and needle counts were correct at closure and at the conclusion of the case.   Findings: Cholecystitis with Cholelithiasis  Estimated Blood Loss: Minimal         Drains: 0         Specimens: Gallbladder           Complications: None; patient tolerated the procedure well.         Disposition: PACU - hemodynamically stable.         Condition: stable

## 2014-06-20 ENCOUNTER — Encounter (HOSPITAL_COMMUNITY): Payer: Self-pay | Admitting: Surgery

## 2014-07-05 ENCOUNTER — Other Ambulatory Visit: Payer: Self-pay | Admitting: Family Medicine

## 2014-07-05 ENCOUNTER — Other Ambulatory Visit: Payer: Self-pay

## 2014-07-05 DIAGNOSIS — N63 Unspecified lump in unspecified breast: Secondary | ICD-10-CM

## 2014-08-17 ENCOUNTER — Other Ambulatory Visit: Payer: BC Managed Care – PPO

## 2014-08-24 ENCOUNTER — Ambulatory Visit
Admission: RE | Admit: 2014-08-24 | Discharge: 2014-08-24 | Disposition: A | Discharge status: Home / Self Care | Payer: BC Managed Care – PPO | Source: Ambulatory Visit | Attending: Family Medicine | Admitting: Family Medicine

## 2014-08-24 DIAGNOSIS — N63 Unspecified lump in unspecified breast: Secondary | ICD-10-CM

## 2014-10-01 ENCOUNTER — Emergency Department (HOSPITAL_BASED_OUTPATIENT_CLINIC_OR_DEPARTMENT_OTHER)
Admission: EM | Admit: 2014-10-01 | Discharge: 2014-10-01 | Disposition: A | Discharge status: Home / Self Care | Payer: BC Managed Care – PPO | Attending: Emergency Medicine | Admitting: Emergency Medicine

## 2014-10-01 ENCOUNTER — Emergency Department (HOSPITAL_BASED_OUTPATIENT_CLINIC_OR_DEPARTMENT_OTHER): Payer: BC Managed Care – PPO

## 2014-10-01 ENCOUNTER — Encounter (HOSPITAL_BASED_OUTPATIENT_CLINIC_OR_DEPARTMENT_OTHER): Payer: Self-pay

## 2014-10-01 DIAGNOSIS — M1990 Unspecified osteoarthritis, unspecified site: Secondary | ICD-10-CM | POA: Insufficient documentation

## 2014-10-01 DIAGNOSIS — M791 Myalgia: Secondary | ICD-10-CM | POA: Diagnosis not present

## 2014-10-01 DIAGNOSIS — R51 Headache: Secondary | ICD-10-CM | POA: Diagnosis not present

## 2014-10-01 DIAGNOSIS — Z8659 Personal history of other mental and behavioral disorders: Secondary | ICD-10-CM | POA: Insufficient documentation

## 2014-10-01 DIAGNOSIS — Z720 Tobacco use: Secondary | ICD-10-CM | POA: Insufficient documentation

## 2014-10-01 DIAGNOSIS — Z8701 Personal history of pneumonia (recurrent): Secondary | ICD-10-CM | POA: Diagnosis not present

## 2014-10-01 DIAGNOSIS — J069 Acute upper respiratory infection, unspecified: Secondary | ICD-10-CM

## 2014-10-01 DIAGNOSIS — Z20828 Contact with and (suspected) exposure to other viral communicable diseases: Secondary | ICD-10-CM | POA: Insufficient documentation

## 2014-10-01 DIAGNOSIS — Z8719 Personal history of other diseases of the digestive system: Secondary | ICD-10-CM | POA: Insufficient documentation

## 2014-10-01 DIAGNOSIS — R05 Cough: Secondary | ICD-10-CM | POA: Diagnosis present

## 2014-10-01 MED ORDER — RIMANTADINE HCL 100 MG PO TABS
100.0000 mg | ORAL_TABLET | Freq: Two times a day (BID) | ORAL | Status: DC
Start: 2014-10-01 — End: 2015-02-19

## 2014-10-01 MED ORDER — HYDROCOD POLST-CHLORPHEN POLST 10-8 MG/5ML PO LQCR: 5.0000 mL | Freq: Two times a day (BID) | ORAL | Status: DC | PRN

## 2014-10-01 NOTE — ED Notes (Addendum)
Patient here with cough, body aches and fever x 1 day. Reports that her spouse is being treated for flu and pneumonia. + smoker, noted congestion. Reports fever >101 last night

## 2014-10-01 NOTE — Discharge Instructions (Signed)
Flumadine as prescribed.  Tussionex as prescribed as needed for cough.  Return to the ER if your symptoms significantly worsen or change.   Viral Infections A viral infection can be caused by different types of viruses.Most viral infections are not serious and resolve on their own. However, some infections may cause severe symptoms and may lead to further complications. SYMPTOMS Viruses can frequently cause:  Minor sore throat.  Aches and pains.  Headaches.  Runny nose.  Different types of rashes.  Watery eyes.  Tiredness.  Cough.  Loss of appetite.  Gastrointestinal infections, resulting in nausea, vomiting, and diarrhea. These symptoms do not respond to antibiotics because the infection is not caused by bacteria. However, you might catch a bacterial infection following the viral infection. This is sometimes called a "superinfection." Symptoms of such a bacterial infection may include:  Worsening sore throat with pus and difficulty swallowing.  Swollen neck glands.  Chills and a high or persistent fever.  Severe headache.  Tenderness over the sinuses.  Persistent overall ill feeling (malaise), muscle aches, and tiredness (fatigue).  Persistent cough.  Yellow, green, or brown mucus production with coughing. HOME CARE INSTRUCTIONS   Only take over-the-counter or prescription medicines for pain, discomfort, diarrhea, or fever as directed by your caregiver.  Drink enough water and fluids to keep your urine clear or pale yellow. Sports drinks can provide valuable electrolytes, sugars, and hydration.  Get plenty of rest and maintain proper nutrition. Soups and broths with crackers or rice are fine. SEEK IMMEDIATE MEDICAL CARE IF:   You have severe headaches, shortness of breath, chest pain, neck pain, or an unusual rash.  You have uncontrolled vomiting, diarrhea, or you are unable to keep down fluids.  You or your child has an oral temperature above 102 F  (38.9 C), not controlled by medicine.  Your baby is older than 3 months with a rectal temperature of 102 F (38.9 C) or higher.  Your baby is 33 months old or younger with a rectal temperature of 100.4 F (38 C) or higher. MAKE SURE YOU:   Understand these instructions.  Will watch your condition.  Will get help right away if you are not doing well or get worse. Document Released: 04/23/2005 Document Revised: 10/06/2011 Document Reviewed: 11/18/2010 [facility] Patient Information 2015 [place], [place]. This information is not intended to replace advice given to you by your health care provider. Make sure you discuss any questions you have with your health care provider.

## 2014-10-02 NOTE — ED Provider Notes (Signed)
CSN: [id number]     Arrival date & time 10/01/14  1013 History   First MD Initiated Contact with Patient 10/01/14 1046     Chief Complaint  Patient presents with  . Generalized Body Aches     (Consider location/radiation/quality/duration/timing/severity/associated sxs/prior Treatment) HPI Comments: Patient is a 44 year old female who presents with body aches, fever, dry cough, headache that started 2 days ago. She tells me that her husband had a positive flu test and for similar symptoms. She denies any chest pain or difficulty breathing.  Patient is a 44 y.o. female presenting with URI. The history is provided by the patient.  URI Presenting symptoms: congestion, cough and fever   Severity:  Moderate Onset quality:  Sudden Duration:  1 day Timing:  Constant Progression:  Worsening Chronicity:  New Relieved by:  Nothing Worsened by:  Nothing tried Associated symptoms: arthralgias, headaches and myalgias     Past Medical History  Diagnosis Date  . Reflux   . Complication of anesthesia     woke up before the tube was removed   . Vaginal delivery     x2  . Pneumonia     as a child- hosp.   Marland Kitchen Anxiety     use to use / take Lorazepam ., last episode of panic  2 yrs. ago  . GERD (gastroesophageal reflux disease)   . Arthritis     hands, knees, back - Osteo   Past Surgical History  Procedure Laterality Date  . Cervical discectomy    . Laparoscopic abdominal exploration      for endometrimosis   . Cholecystectomy N/A 06/16/2014    Procedure: LAPAROSCOPIC CHOLECYSTECTOMY ;  Surgeon: Abigail Miyamoto, MD;  [facility];  Service: General;  Laterality: N/A;  laparoscopic cholecystectomy   No family history on file. History  Substance Use Topics  . Smoking status: Current Every Day Smoker -- 0.50 packs/day    Types: Cigarettes  . Smokeless tobacco: Never Used  . Alcohol Use: Yes     Comment: rarely   OB History    No data available     Review of Systems   Constitutional: Positive for fever.  HENT: Positive for congestion.   Respiratory: Positive for cough.   Musculoskeletal: Positive for myalgias and arthralgias.  Neurological: Positive for headaches.  All other systems reviewed and are negative.     Allergies  Doxycycline  Home Medications   Prior to Admission medications   Medication Sig Start Date End Date Taking? Authorizing Provider  acetaminophen (TYLENOL) 500 MG tablet Take 500 mg by mouth every 6 (six) hours as needed.    Historical Provider, MD  chlorpheniramine-HYDROcodone (TUSSIONEX PENNKINETIC ER) 10-8 MG/5ML LQCR Take 5 mLs by mouth every 12 (twelve) hours as needed for cough. 10/01/14   Geoffery Lyons, MD  rimantadine (FLUMADINE) 100 MG tablet Take 1 tablet (100 mg total) by mouth 2 (two) times daily. 10/01/14   Geoffery Lyons, MD   BP 105/57 mmHg  Pulse 97  Temp(Src) 98.5 F (36.9 C) (Oral)  Resp 20  Ht  (1.676 m)  Wt 150 lb (68.04 kg)  BMI 24.22 kg/m2  SpO2 99%  LMP 09/17/2014 Physical Exam  Constitutional: She is oriented to person, place, and time. She appears well-developed and well-nourished. No distress.  HENT:  Head: Normocephalic and atraumatic.  Neck: Normal range of motion. Neck supple.  Cardiovascular: Normal rate and regular rhythm.  Exam reveals no gallop and no friction rub.   No murmur heard.  Pulmonary/Chest: Effort normal and breath sounds normal. No respiratory distress. She has no wheezes. She has no rales.  Abdominal: Soft. Bowel sounds are normal. She exhibits no distension. There is no tenderness.  Musculoskeletal: Normal range of motion.  Neurological: She is alert and oriented to person, place, and time.  Skin: Skin is warm and dry. She is not diaphoretic.  Nursing note and vitals reviewed.   ED Course  Procedures (including critical care time) Labs Review Labs Reviewed - No data to display  Imaging Review Dg Chest 2 View  10/01/2014   CLINICAL DATA:  44 year old female with a  history of cough, body aches, fever. Current smoker.  EXAM: CHEST - 2 VIEW  COMPARISON:  None.  FINDINGS: Cardiomediastinal silhouette projects within normal limits in size and contour.  Ill-defined airspace opacity at the left base.  No pneumothorax.  No pleural effusion.  No displaced fracture.  Unremarkable appearance of the upper abdomen.  Surgical changes of the cervical region of prior anterior cervical discectomy and fusion with anterior plate and screw fixation.  IMPRESSION: Airspace disease at the left base, concerning for pneumonia given the history.  Signed,  Yvone NeuJaime S. Loreta AveWagner, DO  [facility]  [facility]   Electronically Signed   By: Gilmer MorJaime  Wagner D.O.   On: 10/01/2014 11:15     EKG Interpretation None      MDM   Final diagnoses:  URI (upper respiratory infection)  Exposure to influenza    Patient requesting treatment for flu. Will prescribe flumadine and when necessary return.    Geoffery Lyonsouglas Ignatius Kloos, MD 10/02/14 [phone]

## 2015-01-15 ENCOUNTER — Other Ambulatory Visit: Payer: Self-pay | Admitting: Family Medicine

## 2015-01-15 DIAGNOSIS — N6320 Unspecified lump in the left breast, unspecified quadrant: Secondary | ICD-10-CM

## 2015-01-22 ENCOUNTER — Ambulatory Visit
Admission: RE | Admit: 2015-01-22 | Discharge: 2015-01-22 | Disposition: A | Discharge status: Home / Self Care | Payer: BC Managed Care – PPO | Source: Ambulatory Visit | Attending: Family Medicine | Admitting: Family Medicine

## 2015-01-22 DIAGNOSIS — N6320 Unspecified lump in the left breast, unspecified quadrant: Secondary | ICD-10-CM

## 2015-02-19 ENCOUNTER — Encounter (HOSPITAL_BASED_OUTPATIENT_CLINIC_OR_DEPARTMENT_OTHER): Payer: Self-pay | Admitting: *Deleted

## 2015-02-19 ENCOUNTER — Emergency Department (HOSPITAL_BASED_OUTPATIENT_CLINIC_OR_DEPARTMENT_OTHER)
Admission: EM | Admit: 2015-02-19 | Discharge: 2015-02-19 | Disposition: A | Discharge status: Home / Self Care | Payer: BC Managed Care – PPO | Attending: Emergency Medicine | Admitting: Emergency Medicine

## 2015-02-19 DIAGNOSIS — Z8739 Personal history of other diseases of the musculoskeletal system and connective tissue: Secondary | ICD-10-CM | POA: Diagnosis not present

## 2015-02-19 DIAGNOSIS — Z8659 Personal history of other mental and behavioral disorders: Secondary | ICD-10-CM | POA: Diagnosis not present

## 2015-02-19 DIAGNOSIS — R21 Rash and other nonspecific skin eruption: Secondary | ICD-10-CM | POA: Diagnosis present

## 2015-02-19 DIAGNOSIS — L259 Unspecified contact dermatitis, unspecified cause: Secondary | ICD-10-CM | POA: Insufficient documentation

## 2015-02-19 DIAGNOSIS — Z8719 Personal history of other diseases of the digestive system: Secondary | ICD-10-CM | POA: Insufficient documentation

## 2015-02-19 DIAGNOSIS — Z79899 Other long term (current) drug therapy: Secondary | ICD-10-CM | POA: Diagnosis not present

## 2015-02-19 DIAGNOSIS — Z8701 Personal history of pneumonia (recurrent): Secondary | ICD-10-CM | POA: Diagnosis not present

## 2015-02-19 DIAGNOSIS — Z720 Tobacco use: Secondary | ICD-10-CM | POA: Diagnosis not present

## 2015-02-19 MED ORDER — DEXAMETHASONE SODIUM PHOSPHATE 10 MG/ML IJ SOLN
10.0000 mg | Freq: Once | INTRAMUSCULAR | Status: AC
  Administered 2015-02-19: 10 mg via INTRAMUSCULAR
  Filled 2015-02-19: qty 1

## 2015-02-19 NOTE — Discharge Instructions (Signed)
Please read and follow all provided instructions.  Your diagnoses today include:  1. Contact dermatitis    Tests performed today include:  Vital signs. See below for your results today.   Medications prescribed:   None  Take any prescribed medications only as directed.  Home care instructions:   Follow any educational materials contained in this packet  Follow-up instructions: Please follow-up with your primary care provider as needed for further evaluation of your symptoms.   Return instructions:   Please return to the Emergency Department if you experience worsening symptoms.   Call 9-1-1 immediately if you have an allergic reaction that involves your lips, mouth, throat or if you have any difficulty breathing. This is a life-threatening emergency.   Return if your rash suddenly becomes worse again in the next 4-5 days.   Please return if you have any other emergent concerns.  Additional Information:  Your vital signs today were: BP 120/69 mmHg   Pulse 92   Temp(Src) 98.1 F (36.7 C) (Oral)   Resp 16   Ht  (1.676 m)   Wt 150 lb (68.04 kg)   BMI 24.22 kg/m2   SpO2 99%   LMP 02/12/2015 If your blood pressure (BP) was elevated above 135/85 this visit, please have this repeated by your doctor within one month. --------------

## 2015-02-19 NOTE — ED Notes (Signed)
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

## 2015-02-19 NOTE — ED Provider Notes (Signed)
CSN: [id number]     Arrival date & time 02/19/15  1013 History   First MD Initiated Contact with Patient 02/19/15 1025     Chief Complaint  Patient presents with  . Rash     (Consider location/radiation/quality/duration/timing/severity/associated sxs/prior Treatment) HPI Comments: Patient presents with complaint of poison oak rash which started 3 days ago. Patient works as a Administrator during the summer. She states that she was pulling a lot of poison oak, which in the past has not caused her a significant reaction. However patient developed very itchy small papules to her legs, arms, and left face after working outside. Patient has some small bumps above her left eye on her eyelid which she frequently rubs causing her eye to burn and water. She has had some blurry vision but only when her eye is tearing. No other vision change. No fevers. No new foods or medications. No recent tick bites. No fever, nausea or vomiting. Patient has used calamine lotion and bleach on the areas without relief.  Patient is a 44 y.o. female presenting with rash. The history is provided by the patient.  Rash Associated symptoms: no fever, no myalgias, no nausea, no shortness of breath, not vomiting and not wheezing     Past Medical History  Diagnosis Date  . Reflux   . Complication of anesthesia     woke up before the tube was removed   . Vaginal delivery     x2  . Pneumonia     as a child- hosp.   Marland Kitchen Anxiety     use to use / take Lorazepam ., last episode of panic  2 yrs. ago  . GERD (gastroesophageal reflux disease)   . Arthritis     hands, knees, back - Osteo   Past Surgical History  Procedure Laterality Date  . Cervical discectomy    . Laparoscopic abdominal exploration      for endometrimosis   . Cholecystectomy N/A 06/16/2014    Procedure: LAPAROSCOPIC CHOLECYSTECTOMY ;  Surgeon: Abigail Miyamoto, MD;  Location: [facility] OR;  Service: General;  Laterality: N/A;  laparoscopic cholecystectomy   No  family history on file. History  Substance Use Topics  . Smoking status: Current Every Day Smoker -- 0.50 packs/day    Types: Cigarettes  . Smokeless tobacco: Never Used  . Alcohol Use: Yes     Comment: rarely   OB History    No data available     Review of Systems  Constitutional: Negative for fever.  HENT: Negative for facial swelling and trouble swallowing.   Eyes: Negative for redness.  Respiratory: Negative for shortness of breath, wheezing and stridor.   Cardiovascular: Negative for chest pain.  Gastrointestinal: Negative for nausea and vomiting.  Musculoskeletal: Negative for myalgias.  Skin: Positive for rash.  Neurological: Negative for light-headedness.  Psychiatric/Behavioral: Negative for confusion.      Allergies  Doxycycline  Home Medications   Prior to Admission medications   Medication Sig Start Date End Date Taking? Authorizing Provider  acetaminophen (TYLENOL) 500 MG tablet Take 500 mg by mouth every 6 (six) hours as needed.    Historical Provider, MD  chlorpheniramine-HYDROcodone (TUSSIONEX PENNKINETIC ER) 10-8 MG/5ML LQCR Take 5 mLs by mouth every 12 (twelve) hours as needed for cough. 10/01/14   Geoffery Lyons, MD  rimantadine (FLUMADINE) 100 MG tablet Take 1 tablet (100 mg total) by mouth 2 (two) times daily. 10/01/14   Geoffery Lyons, MD   BP 120/69 mmHg  Pulse 92  Temp(Src) 98.1 F (36.7 C) (Oral)  Resp 16  Ht 5\' 6"  (1.676 m)  Wt 150 lb (68.04 kg)  BMI 24.22 kg/m2  SpO2 99%  LMP 02/12/2015   Physical Exam  Constitutional: She appears well-developed and well-nourished.  HENT:  Head: Normocephalic and atraumatic.  Mouth/Throat: Oropharynx is clear and moist.  No mucosal involvement. No signs of angioedema.  Eyes: Conjunctivae, EOM and lids are normal. Pupils are equal, round, and reactive to light. Right eye exhibits no chemosis, no discharge, no exudate and no hordeolum. No foreign body present in the right eye. Left eye exhibits no chemosis, no  discharge, no exudate and no hordeolum. No foreign body present in the left eye. Right conjunctiva is not injected. Left conjunctiva is not injected.  Patient with a small papule noted on the L upper eyelid, it is very mild.   Neck: Normal range of motion. Neck supple.  Pulmonary/Chest: No respiratory distress.  Neurological: She is alert.  Skin: Skin is warm and dry. Rash noted.  Scattered areas of small papules of bilateral legs and arms consistent with contact dermatitis.   Psychiatric: She has a normal mood and affect.  Nursing note and vitals reviewed.   ED Course  Procedures (including critical care time) Labs Review Labs Reviewed - No data to display  Imaging Review No results found.   EKG Interpretation None       10:42 AM Patient seen and examined. Will give IM decadron. Discussed utility in tapered prolonged course of prednisone due to the possibility of rebound reaction. Patient states that she does not tolerate prednisone well and would not take any prescribed. She is encouraged to monitor for rebound reaction and to return in emergency department for additional treatment if this occurs. Patient may also use low potency topical sterile cream if desired on each areas, encouraged to avoid eyes.  Vital signs reviewed and are as follows: BP 120/69 mmHg  Pulse 92  Temp(Src) 98.1 F (36.7 C) (Oral)  Resp 16  Ht 5\' 6"  (1.676 m)  Wt 150 lb (68.04 kg)  BMI 24.22 kg/m2  SpO2 99%  LMP 02/12/2015    MDM   Final diagnoses:  Contact dermatitis   Patient with exposure to poison oak and subsequent skin reaction. No anaphylaxis. No systemic symptoms of illness. No reported tick bites. Presentation is consistent with contact dermatitis. She has a small area on her left upper eyelid, globe appears normal.    Renne Crigler, PA-C 02/19/15 1450  Doug Sou, MD 02/19/15 1722

## 2015-02-19 NOTE — ED Notes (Signed)
D/c home with instructions for f/u discussed- no new rx given

## 2015-03-14 ENCOUNTER — Encounter (HOSPITAL_BASED_OUTPATIENT_CLINIC_OR_DEPARTMENT_OTHER): Payer: Self-pay | Admitting: Emergency Medicine

## 2015-03-14 ENCOUNTER — Emergency Department (HOSPITAL_BASED_OUTPATIENT_CLINIC_OR_DEPARTMENT_OTHER)
Admission: EM | Admit: 2015-03-14 | Discharge: 2015-03-14 | Disposition: A | Discharge status: Home / Self Care | Payer: BC Managed Care – PPO | Attending: Emergency Medicine | Admitting: Emergency Medicine

## 2015-03-14 ENCOUNTER — Emergency Department (HOSPITAL_BASED_OUTPATIENT_CLINIC_OR_DEPARTMENT_OTHER): Payer: BC Managed Care – PPO

## 2015-03-14 DIAGNOSIS — Y998 Other external cause status: Secondary | ICD-10-CM | POA: Insufficient documentation

## 2015-03-14 DIAGNOSIS — Z8719 Personal history of other diseases of the digestive system: Secondary | ICD-10-CM | POA: Insufficient documentation

## 2015-03-14 DIAGNOSIS — Z8701 Personal history of pneumonia (recurrent): Secondary | ICD-10-CM | POA: Insufficient documentation

## 2015-03-14 DIAGNOSIS — Y9389 Activity, other specified: Secondary | ICD-10-CM | POA: Insufficient documentation

## 2015-03-14 DIAGNOSIS — M1990 Unspecified osteoarthritis, unspecified site: Secondary | ICD-10-CM | POA: Diagnosis not present

## 2015-03-14 DIAGNOSIS — F419 Anxiety disorder, unspecified: Secondary | ICD-10-CM | POA: Diagnosis not present

## 2015-03-14 DIAGNOSIS — S199XXA Unspecified injury of neck, initial encounter: Secondary | ICD-10-CM | POA: Insufficient documentation

## 2015-03-14 DIAGNOSIS — M542 Cervicalgia: Secondary | ICD-10-CM

## 2015-03-14 DIAGNOSIS — Y92410 Unspecified street and highway as the place of occurrence of the external cause: Secondary | ICD-10-CM | POA: Insufficient documentation

## 2015-03-14 DIAGNOSIS — Z720 Tobacco use: Secondary | ICD-10-CM | POA: Diagnosis not present

## 2015-03-14 MED ORDER — CYCLOBENZAPRINE HCL 10 MG PO TABS
5.0000 mg | ORAL_TABLET | Freq: Once | ORAL | Status: AC
  Administered 2015-03-14: 5 mg via ORAL
  Filled 2015-03-14: qty 1

## 2015-03-14 MED ORDER — METHOCARBAMOL 500 MG PO TABS: 500.0000 mg | ORAL_TABLET | Freq: Two times a day (BID) | ORAL | Status: DC

## 2015-03-14 MED ORDER — NAPROXEN 500 MG PO TABS: 500.0000 mg | ORAL_TABLET | Freq: Two times a day (BID) | ORAL | Status: DC
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
## 2015-03-14 NOTE — ED Notes (Signed)
PA at bedside.

## 2015-03-14 NOTE — Discharge Instructions (Signed)
Cervical Strain and Sprain (Whiplash) with Rehab Cervical strain and sprain are injuries that commonly occur with "whiplash" injuries. Whiplash occurs when the neck is forcefully whipped backward or forward, such as during a motor vehicle accident or during contact sports. The muscles, ligaments, tendons, discs, and nerves of the neck are susceptible to injury when this occurs. RISK FACTORS Risk of having a whiplash injury increases if:  Osteoarthritis of the spine.  Situations that make head or neck accidents or trauma more likely.  High-risk sports (football, rugby, wrestling, hockey, auto racing, gymnastics, diving, contact karate, or boxing).  Poor strength and flexibility of the neck.  Previous neck injury.  Poor tackling technique.  Improperly fitted or padded equipment. SYMPTOMS   Pain or stiffness in the front or back of neck or both.  Symptoms may present immediately or up to 24 hours after injury.  Dizziness, headache, nausea, and vomiting.  Muscle spasm with soreness and stiffness in the neck.  Tenderness and swelling at the injury site. PREVENTION  Learn and use proper technique (avoid tackling with the head, spearing, and head-butting; use proper falling techniques to avoid landing on the head).  Warm up and stretch properly before activity.  Maintain physical fitness:  Strength, flexibility, and endurance.  Cardiovascular fitness.  Wear properly fitted and padded protective equipment, such as padded soft collars, for participation in contact sports. PROGNOSIS  Recovery from cervical strain and sprain injuries is dependent on the extent of the injury. These injuries are usually curable in 1 week to 3 months with appropriate treatment.  RELATED COMPLICATIONS   Temporary numbness and weakness may occur if the nerve roots are damaged, and this may persist until the nerve has completely healed.  Chronic pain due to frequent recurrence of  symptoms.  Prolonged healing, especially if activity is resumed too soon (before complete recovery). TREATMENT  Treatment initially involves the use of ice and medication to help reduce pain and inflammation. It is also important to perform strengthening and stretching exercises and modify activities that worsen symptoms so the injury does not get worse. These exercises may be performed at home or with a therapist. For patients who experience severe symptoms, a soft, padded collar may be recommended to be worn around the neck.  Improving your posture may help reduce symptoms. Posture improvement includes pulling your chin and abdomen in while sitting or standing. If you are sitting, sit in a firm chair with your buttocks against the back of the chair. While sleeping, try replacing your pillow with a small towel rolled to 2 inches in diameter, or use a cervical pillow or soft cervical collar. Poor sleeping positions delay healing.  For patients with nerve root damage, which causes numbness or weakness, the use of a cervical traction apparatus may be recommended. Surgery is rarely necessary for these injuries. However, cervical strain and sprains that are present at birth (congenital) may require surgery. MEDICATION   If pain medication is necessary, nonsteroidal anti-inflammatory medications, such as aspirin and ibuprofen, or other minor pain relievers, such as acetaminophen, are often recommended.  Do not take pain medication for 7 days before surgery.  Prescription pain relievers may be given if deemed necessary by your caregiver. Use only as directed and only as much as you need. HEAT AND COLD:   Cold treatment (icing) relieves pain and reduces inflammation. Cold treatment should be applied for 10 to 15 minutes every 2 to 3 hours for inflammation and pain and immediately after any activity that aggravates   your symptoms. Use ice packs or an ice massage.  Heat treatment may be used prior to  performing the stretching and strengthening activities prescribed by your caregiver, physical therapist, or athletic trainer. Use a heat pack or a warm soak. SEEK MEDICAL CARE IF:   Symptoms get worse or do not improve in 2 weeks despite treatment.  New, unexplained symptoms develop (drugs used in treatment may produce side effects). EXERCISES RANGE OF MOTION (ROM) AND STRETCHING EXERCISES - Cervical Strain and Sprain These exercises may help you when beginning to rehabilitate your injury. In order to successfully resolve your symptoms, you must improve your posture. These exercises are designed to help reduce the forward-head and rounded-shoulder posture which contributes to this condition. Your symptoms may resolve with or without further involvement from your physician, physical therapist or athletic trainer. While completing these exercises, remember:   Restoring tissue flexibility helps normal motion to return to the joints. This allows healthier, less painful movement and activity.  An effective stretch should be held for at least 20 seconds, although you may need to begin with shorter hold times for comfort.  A stretch should never be painful. You should only feel a gentle lengthening or release in the stretched tissue. STRETCH- Axial Extensors  Lie on your back on the floor. You may bend your knees for comfort. Place a rolled-up hand towel or dish towel, about 2 inches in diameter, under the part of your head that makes contact with the floor.  Gently tuck your chin, as if trying to make a "double chin," until you feel a gentle stretch at the base of your head.  Hold __________ seconds. Repeat __________ times. Complete this exercise __________ times per day.  STRETCH - Axial Extension   Stand or sit on a firm surface. Assume a good posture: chest up, shoulders drawn back, abdominal muscles slightly tense, knees unlocked (if standing) and feet hip width apart.  Slowly retract your  chin so your head slides back and your chin slightly lowers. Continue to look straight ahead.  You should feel a gentle stretch in the back of your head. Be certain not to feel an aggressive stretch since this can cause headaches later.  Hold for __________ seconds. Repeat __________ times. Complete this exercise __________ times per day. STRETCH - Cervical Side Bend   Stand or sit on a firm surface. Assume a good posture: chest up, shoulders drawn back, abdominal muscles slightly tense, knees unlocked (if standing) and feet hip width apart.  Without letting your nose or shoulders move, slowly tip your right / left ear to your shoulder until your feel a gentle stretch in the muscles on the opposite side of your neck.  Hold __________ seconds. Repeat __________ times. Complete this exercise __________ times per day. STRETCH - Cervical Rotators   Stand or sit on a firm surface. Assume a good posture: chest up, shoulders drawn back, abdominal muscles slightly tense, knees unlocked (if standing) and feet hip width apart.  Keeping your eyes level with the ground, slowly turn your head until you feel a gentle stretch along the back and opposite side of your neck.  Hold __________ seconds. Repeat __________ times. Complete this exercise __________ times per day. RANGE OF MOTION - Neck Circles   Stand or sit on a firm surface. Assume a good posture: chest up, shoulders drawn back, abdominal muscles slightly tense, knees unlocked (if standing) and feet hip width apart.  Gently roll your head down and around from the   back of one shoulder to the back of the other. The motion should never be forced or painful.  Repeat the motion 10-20 times, or until you feel the neck muscles relax and loosen. Repeat __________ times. Complete the exercise __________ times per day. STRENGTHENING EXERCISES - Cervical Strain and Sprain These exercises may help you when beginning to rehabilitate your injury. They may  resolve your symptoms with or without further involvement from your physician, physical therapist, or athletic trainer. While completing these exercises, remember:   Muscles can gain both the endurance and the strength needed for everyday activities through controlled exercises.  Complete these exercises as instructed by your physician, physical therapist, or athletic trainer. Progress the resistance and repetitions only as guided.  You may experience muscle soreness or fatigue, but the pain or discomfort you are trying to eliminate should never worsen during these exercises. If this pain does worsen, stop and make certain you are following the directions exactly. If the pain is still present after adjustments, discontinue the exercise until you can discuss the trouble with your clinician. STRENGTH - Cervical Flexors, Isometric  Face a wall, standing about 6 inches away. Place a small pillow, a ball about 6-8 inches in diameter, or a folded towel between your forehead and the wall.  Slightly tuck your chin and gently push your forehead into the soft object. Push only with mild to moderate intensity, building up tension gradually. Keep your jaw and forehead relaxed.  Hold 10 to 20 seconds. Keep your breathing relaxed.  Release the tension slowly. Relax your neck muscles completely before you start the next repetition. Repeat __________ times. Complete this exercise __________ times per day. STRENGTH- Cervical Lateral Flexors, Isometric   Stand about 6 inches away from a wall. Place a small pillow, a ball about 6-8 inches in diameter, or a folded towel between the side of your head and the wall.  Slightly tuck your chin and gently tilt your head into the soft object. Push only with mild to moderate intensity, building up tension gradually. Keep your jaw and forehead relaxed.  Hold 10 to 20 seconds. Keep your breathing relaxed.  Release the tension slowly. Relax your neck muscles completely  before you start the next repetition. Repeat __________ times. Complete this exercise __________ times per day. STRENGTH - Cervical Extensors, Isometric   Stand about 6 inches away from a wall. Place a small pillow, a ball about 6-8 inches in diameter, or a folded towel between the back of your head and the wall.  Slightly tuck your chin and gently tilt your head back into the soft object. Push only with mild to moderate intensity, building up tension gradually. Keep your jaw and forehead relaxed.  Hold 10 to 20 seconds. Keep your breathing relaxed.  Release the tension slowly. Relax your neck muscles completely before you start the next repetition. Repeat __________ times. Complete this exercise __________ times per day. POSTURE AND BODY MECHANICS CONSIDERATIONS - Cervical Strain and Sprain Keeping correct posture when sitting, standing or completing your activities will reduce the stress put on different body tissues, allowing injured tissues a chance to heal and limiting painful experiences. The following are general guidelines for improved posture. Your physician or physical therapist will provide you with any instructions specific to your needs. While reading these guidelines, remember:  The exercises prescribed by your provider will help you have the flexibility and strength to maintain correct postures.  The correct posture provides the optimal environment for your joints to   work. All of your joints have less wear and tear when properly supported by a spine with good posture. This means you will experience a healthier, less painful body.  Correct posture must be practiced with all of your activities, especially prolonged sitting and standing. Correct posture is as important when doing repetitive low-stress activities (typing) as it is when doing a single heavy-load activity (lifting). PROLONGED STANDING WHILE SLIGHTLY LEANING FORWARD When completing a task that requires you to lean  forward while standing in one place for a long time, place either foot up on a stationary 2- to 4-inch high object to help maintain the best posture. When both feet are on the ground, the low back tends to lose its slight inward curve. If this curve flattens (or becomes too large), then the back and your other joints will experience too much stress, fatigue more quickly, and can cause pain.  RESTING POSITIONS Consider which positions are most painful for you when choosing a resting position. If you have pain with flexion-based activities (sitting, bending, stooping, squatting), choose a position that allows you to rest in a less flexed posture. You would want to avoid curling into a fetal position on your side. If your pain worsens with extension-based activities (prolonged standing, working overhead), avoid resting in an extended position such as sleeping on your stomach. Most people will find more comfort when they rest with their spine in a more neutral position, neither too rounded nor too arched. Lying on a non-sagging bed on your side with a pillow between your knees, or on your back with a pillow under your knees will often provide some relief. Keep in mind, being in any one position for a prolonged period of time, no matter how correct your posture, can still lead to stiffness. WALKING Walk with an upright posture. Your ears, shoulders, and hips should all line up. OFFICE WORK When working at a desk, create an environment that supports good, upright posture. Without extra support, muscles fatigue and lead to excessive strain on joints and other tissues. CHAIR:  A chair should be able to slide under your desk when your back makes contact with the back of the chair. This allows you to work closely.  The chair's height should allow your eyes to be level with the upper part of your monitor and your hands to be slightly lower than your elbows.  Body position:  Your feet should make contact with the  floor. If this is not possible, use a foot rest.  Keep your ears over your shoulders. This will reduce stress on your neck and low back. Document Released: 07/14/2005 Document Revised: 11/28/2013 Document Reviewed: 10/26/2008 [employer] Patient Information 2015 [employer]. This information is not intended to replace advice given to you by your health care provider. Make sure you discuss any questions you have with your health care provider.  
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
## 2015-05-01 ENCOUNTER — Encounter: Payer: Self-pay | Admitting: Family Medicine

## 2015-05-01 ENCOUNTER — Ambulatory Visit (INDEPENDENT_AMBULATORY_CARE_PROVIDER_SITE_OTHER): Payer: BC Managed Care – PPO | Admitting: Family Medicine

## 2015-05-01 VITALS — BP 111/74 | HR 76 | Temp 98.9°F | Resp 18 | Ht 66.0 in | Wt 155.0 lb

## 2015-05-01 DIAGNOSIS — Z720 Tobacco use: Secondary | ICD-10-CM | POA: Diagnosis not present

## 2015-05-01 DIAGNOSIS — J0100 Acute maxillary sinusitis, unspecified: Secondary | ICD-10-CM | POA: Diagnosis not present

## 2015-05-01 DIAGNOSIS — Z0000 Encounter for general adult medical examination without abnormal findings: Secondary | ICD-10-CM

## 2015-05-01 MED ORDER — FLUCONAZOLE 150 MG PO TABS: 150.0000 mg | ORAL_TABLET | Freq: Once | ORAL | Status: DC

## 2015-05-01 MED ORDER — FLUTICASONE PROPIONATE 50 MCG/ACT NA SUSP: 2.0000 | Freq: Every day | NASAL | Status: DC

## 2015-05-01 MED ORDER — AMOXICILLIN-POT CLAVULANATE 875-125 MG PO TABS: 1.0000 | ORAL_TABLET | Freq: Two times a day (BID) | ORAL | Status: DC

## 2015-05-01 NOTE — Patient Instructions (Signed)

## 2015-05-01 NOTE — Progress Notes (Signed)
Subjective:    Patient ID: Brenda Ray, female    DOB: [birth year], 44 y.o.   MRN: [id number]  HPI Patient presents the office to establish care, with a new acute illness. All past medical history, surgical history, allergies, family history, immunizations and social history was obtained from the patient today and entered into the electronic medical record. Records are requested from her prior PCP, and will be reviewed at the time they are received. All medical records will be updated at that time.  Congestion: Patient presents for acute office visit with a 2 day history of headache, facial pain, congestion, mild sore throat, mild runny nose, mild cough with reports of low-grade fever and decreased appetite. In addition she endorses blood-tinged nasal drainage. Patient states that she did use a new pillow on Sunday, that she states smelled like mold or dust. Since using this pillow she feels that her symptoms started. She tried to use Benadryl and Claritin. She did not find relief with the use of these medications. She also meant to mild vomiting and diarrhea 1 yesterday. 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  Tobacco abuse: Patient reports that she isn't every day smoker, she currently has no desire to quit smoking. She states she is the last one in her household to quit smoking, and she plans to do it at some point, but she is not ready yet.  Health maintenance:  Colonoscopy: No family history, routine screening to start at 31 Mammogram: Mammogram up-to-date, history of left breast biopsy that was benign, last mammogram 08/24/2014. Cervical cancer screening: Indicated, patient with prior history of abnormal as a young adult, has not had a Pap in recent years. Immunizations: Unknown immunization records, records have been requested Infectious disease screening: Unknown screening tests, records have been requested  Past Medical History    Diagnosis Date  . Reflux   . Complication of anesthesia     woke up before the tube was removed   . Vaginal delivery     x2  . Pneumonia     as a child- hosp.   Marland Kitchen Anxiety     use to use / take Lorazepam ., last episode of panic  2 yrs. ago  . GERD (gastroesophageal reflux disease)   . Arthritis     hands, knees, back - Osteo  . Anemia   . History of chicken pox    Allergies  Allergen Reactions  . Doxycycline Nausea And Vomiting  . Hydrocodone Itching   Social History   Social History  . Marital Status: Divorced    Spouse Name: N/A  . Number of Children: N/A  . Years of Education: N/A   Occupational History  . Not on file.   Social History Main Topics  . Smoking status: Current Every Day Smoker -- 0.50 packs/day    Types: Cigarettes  . Smokeless tobacco: Never Used  . Alcohol Use: Yes     Comment: rarely  . Drug Use: No  . Sexual Activity: Yes    Birth Control/ Protection: None   Other Topics Concern  . Not on file   Social History Narrative   Lives with Son and significant other. Engaged to be married.   Bus Arts administrator at the [employer]   Every day smoker   No etoh. No drugs. Wears her seatbelt.    Does not exercise daily.    Firearms present in the home in  a gun safe.   Smoke alarm in the home.    Feel safe in relationships.    Past Surgical History  Procedure Laterality Date  . Cervical discectomy    . Laparoscopic abdominal exploration      for endometrimosis   . Cholecystectomy N/A 06/16/2014    Procedure: LAPAROSCOPIC CHOLECYSTECTOMY ;  Surgeon: Abigail Miyamoto, MD;  Location: [facility] OR;  Service: General;  Laterality: N/A;  laparoscopic cholecystectomy  . Neck surgery  2014  . Breast biopsy Left 2014    fatty tissue only   Family History  Problem Relation Age of Onset  . Arthritis Mother   . Hyperlipidemia Mother   . Hypertension Mother   . Thyroid disease Mother     hypothyroid  . Arthritis Father   . Hyperlipidemia Father    . Hypertension Father   . Thyroid disease Brother   . Arthritis Maternal Grandmother   . Arthritis Maternal Grandfather   . Arthritis Paternal Grandmother   . Arthritis Paternal Grandfather      Review of Systems Negative, with the exception of above mentioned in HPI     Objective:   Physical Exam BP 111/74 mmHg  Pulse 76  Temp(Src) 98.9 F (37.2 C) (Temporal)  Resp 18  Ht  (1.676 m)  Wt 155 lb (70.308 kg)  BMI 25.03 kg/m2  SpO2 98% Gen: Afebrile. No acute distress. Nontoxic in appearance, well-developed, well-nourished Caucasian female. HENT: AT. [place]. Bilateral TM visualized shiny/full in appearance, no erythema, bulging membrane or drainage. MMM. Bilateral nares erythema and swelling. Throat without erythema or exudates. Mild cough present on exam, hoarseness present on exam. Eyes:Pupils Equal Round Reactive to light, Extraocular movements intact,  Conjunctiva without redness, discharge or icterus. Neck/lymp/endocrine: Supple, anterior cervical lymphadenopathy CV: RRR  Chest: CTAB, no wheeze or crackles Abd: Soft. Flat. NTND. BS present. No Masses palpated.  Skin: No rashes, purpura or petechiae.      Assessment & Plan:  1. Acute maxillary sinusitis, recurrence not specified - Acute Left >R maxillary sinusitis - fluticasone (FLONASE) 50 MCG/ACT nasal spray; Place 2 sprays into both nostrils daily.  Dispense: 16 g; Refill: 1 - amoxicillin-clavulanate (AUGMENTIN) 875-125 MG tablet; Take 1 tablet by mouth 2 (two) times daily.  Dispense: 20 tablet; Refill: 0 - fluconazole (DIFLUCAN) 150 MG tablet; Take 1 tablet (150 mg total) by mouth once.  Dispense: 1 tablet; Refill: 0 - work excuse for today and tomorrow provided.   2. Tobacco abuse - patient declined counseling today, she is not ready to stop smoking yet.  - Advised her if she needs assistance when she is ready, we can help her with it and give her resources/medications if desired.   3. Health maintenance:   Colonoscopy: No family history, routine screening to start at 31 Mammogram: Mammogram up-to-date, history of left breast biopsy that was benign, last mammogram 08/24/2014. Cervical cancer screening: Indicated, patient with prior history of abnormal as a young adult, has not had a Pap in recent years. Immunizations: Unknown immunization records, records have been requested Infectious disease screening: Unknown screening tests, records have been requested  Records requested today from prior PCP. Follow-up in 1-2 months for complete physical/Pap, sooner if needed.

## 2015-05-01 NOTE — Progress Notes (Signed)
Pre visit review using our clinic review tool, if applicable. No additional management support is needed unless otherwise documented below in the visit note. 

## 2015-05-16 ENCOUNTER — Encounter: Payer: Self-pay | Admitting: Family Medicine

## 2015-06-20 ENCOUNTER — Encounter: Payer: BC Managed Care – PPO | Admitting: Family Medicine

## 2015-07-29 HISTORY — PX: ANTERIOR CERVICAL DECOMP/DISCECTOMY FUSION: SHX1161

## 2015-08-13 ENCOUNTER — Other Ambulatory Visit: Payer: Self-pay | Admitting: Family Medicine

## 2015-08-13 DIAGNOSIS — N6320 Unspecified lump in the left breast, unspecified quadrant: Secondary | ICD-10-CM

## 2015-08-27 ENCOUNTER — Ambulatory Visit
Admission: RE | Admit: 2015-08-27 | Discharge: 2015-08-27 | Disposition: A | Discharge status: Home / Self Care | Payer: BC Managed Care – PPO | Source: Ambulatory Visit | Attending: Family Medicine | Admitting: Family Medicine

## 2015-08-27 DIAGNOSIS — N6320 Unspecified lump in the left breast, unspecified quadrant: Secondary | ICD-10-CM

## 2015-08-27 DIAGNOSIS — Z0000 Encounter for general adult medical examination without abnormal findings: Secondary | ICD-10-CM

## 2015-10-01 ENCOUNTER — Telehealth: Payer: Self-pay | Admitting: Family Medicine

## 2015-10-01 ENCOUNTER — Encounter: Payer: Self-pay | Admitting: Family Medicine

## 2015-10-01 ENCOUNTER — Ambulatory Visit (INDEPENDENT_AMBULATORY_CARE_PROVIDER_SITE_OTHER): Payer: BC Managed Care – PPO | Admitting: Family Medicine

## 2015-10-01 ENCOUNTER — Other Ambulatory Visit (HOSPITAL_COMMUNITY)
Admission: RE | Admit: 2015-10-01 | Discharge: 2015-10-01 | Disposition: A | Discharge status: Home / Self Care | Payer: BC Managed Care – PPO | Source: Ambulatory Visit | Attending: Family Medicine | Admitting: Family Medicine

## 2015-10-01 VITALS — BP 116/74 | HR 74 | Temp 98.1°F | Resp 20 | Ht 66.0 in | Wt 158.5 lb

## 2015-10-01 DIAGNOSIS — N393 Stress incontinence (female) (male): Secondary | ICD-10-CM | POA: Diagnosis not present

## 2015-10-01 DIAGNOSIS — Z1321 Encounter for screening for nutritional disorder: Secondary | ICD-10-CM

## 2015-10-01 DIAGNOSIS — Z0000 Encounter for general adult medical examination without abnormal findings: Secondary | ICD-10-CM

## 2015-10-01 DIAGNOSIS — Z131 Encounter for screening for diabetes mellitus: Secondary | ICD-10-CM | POA: Diagnosis not present

## 2015-10-01 DIAGNOSIS — Z130 Encounter for screening for diseases of the blood and blood-forming organs and certain disorders involving the immune mechanism: Secondary | ICD-10-CM | POA: Diagnosis not present

## 2015-10-01 DIAGNOSIS — Z124 Encounter for screening for malignant neoplasm of cervix: Secondary | ICD-10-CM

## 2015-10-01 DIAGNOSIS — R5383 Other fatigue: Secondary | ICD-10-CM | POA: Insufficient documentation

## 2015-10-01 DIAGNOSIS — Z13220 Encounter for screening for lipoid disorders: Secondary | ICD-10-CM

## 2015-10-01 DIAGNOSIS — R5382 Chronic fatigue, unspecified: Secondary | ICD-10-CM

## 2015-10-01 DIAGNOSIS — Z01419 Encounter for gynecological examination (general) (routine) without abnormal findings: Secondary | ICD-10-CM | POA: Diagnosis present

## 2015-10-01 DIAGNOSIS — E559 Vitamin D deficiency, unspecified: Secondary | ICD-10-CM | POA: Insufficient documentation

## 2015-10-01 DIAGNOSIS — Z1329 Encounter for screening for other suspected endocrine disorder: Secondary | ICD-10-CM

## 2015-10-01 DIAGNOSIS — N6320 Unspecified lump in the left breast, unspecified quadrant: Secondary | ICD-10-CM

## 2015-10-01 DIAGNOSIS — N63 Unspecified lump in breast: Secondary | ICD-10-CM

## 2015-10-01 DIAGNOSIS — Z1151 Encounter for screening for human papillomavirus (HPV): Secondary | ICD-10-CM | POA: Insufficient documentation

## 2015-10-01 DIAGNOSIS — E538 Deficiency of other specified B group vitamins: Secondary | ICD-10-CM | POA: Insufficient documentation

## 2015-10-01 LAB — LIPID PANEL
CHOLESTEROL: 166 mg/dL (ref 0–200)
HDL: 42.1 mg/dL (ref >39.00)
LDL CALC: 106 mg/dL — AB (ref 0–99)
NonHDL: 123.64
TRIGLYCERIDES: 87 mg/dL (ref 0.0–149.0)
Total CHOL/HDL Ratio: 4
VLDL: 17.4 mg/dL (ref 0.0–40.0)

## 2015-10-01 LAB — COMPREHENSIVE METABOLIC PANEL
ALBUMIN: 4.6 g/dL (ref 3.5–5.2)
ALK PHOS: 48 U/L (ref 39–117)
ALT: 17 U/L (ref 0–35)
AST: 14 U/L (ref 0–37)
BUN: 9 mg/dL (ref 6–23)
CALCIUM: 9.4 mg/dL (ref 8.4–10.5)
CHLORIDE: 105 meq/L (ref 96–112)
CO2: 25 mEq/L (ref 19–32)
Creatinine, Ser: 0.72 mg/dL (ref 0.40–1.20)
GFR: 92.26 mL/min (ref >60.00)
Glucose, Bld: 95 mg/dL (ref 70–99)
POTASSIUM: 4.1 meq/L (ref 3.5–5.1)
Sodium: 136 mEq/L (ref 135–145)
TOTAL PROTEIN: 7.1 g/dL (ref 6.0–8.3)
Total Bilirubin: 0.7 mg/dL (ref 0.2–1.2)

## 2015-10-01 LAB — HEMOGLOBIN A1C: HEMOGLOBIN A1C: 4.9 % (ref 4.6–6.5)

## 2015-10-01 LAB — CBC WITH DIFFERENTIAL/PLATELET
BASOS PCT: 0.6 % (ref 0.0–3.0)
Basophils Absolute: 0 10*3/uL (ref 0.0–0.1)
EOS PCT: 1 % (ref 0.0–5.0)
Eosinophils Absolute: 0.1 10*3/uL (ref 0.0–0.7)
HEMATOCRIT: 41.8 % (ref 36.0–46.0)
HEMOGLOBIN: 14.3 g/dL (ref 12.0–15.0)
Lymphocytes Relative: 32.8 % (ref 12.0–46.0)
Lymphs Abs: 2.3 10*3/uL (ref 0.7–4.0)
MCHC: 34.2 g/dL (ref 30.0–36.0)
MCV: 90.5 fl (ref 78.0–100.0)
MONO ABS: 0.4 10*3/uL (ref 0.1–1.0)
Monocytes Relative: 6 % (ref 3.0–12.0)
Neutro Abs: 4.2 10*3/uL (ref 1.4–7.7)
Neutrophils Relative %: 59.6 % (ref 43.0–77.0)
Platelets: 227 10*3/uL (ref 150.0–400.0)
RBC: 4.62 Mil/uL (ref 3.87–5.11)
RDW: 13.4 % (ref 11.5–15.5)
WBC: 7.1 10*3/uL (ref 4.0–10.5)

## 2015-10-01 LAB — TSH: TSH: 1.28 u[IU]/mL (ref 0.35–4.50)

## 2015-10-01 LAB — FERRITIN: FERRITIN: 61 ng/mL (ref 10.0–291.0)

## 2015-10-01 LAB — VITAMIN D 25 HYDROXY (VIT D DEFICIENCY, FRACTURES): VITD: 25.19 ng/mL — AB (ref 30.00–100.00)

## 2015-10-01 LAB — VITAMIN B12: VITAMIN B 12: 195 pg/mL — AB (ref 211–911)

## 2015-10-01 MED ORDER — VITAMIN D (ERGOCALCIFEROL) 1.25 MG (50000 UNIT) PO CAPS: 50000.0000 [IU] | ORAL_CAPSULE | ORAL | Status: DC

## 2015-10-01 MED ORDER — VITAMIN B-12 1000 MCG PO TABS: ORAL_TABLET | ORAL | Status: DC

## 2015-10-01 NOTE — Progress Notes (Signed)
Patient ID: Brenda Ray, female   DOB: July 15, 1971, 45 y.o.   MRN: [id number]      Patient ID: Brenda Ray, female  DOB: 04/20/1971, 45 y.o.   MRN: [id number]  Subjective:  Brenda Ray is a 45 y.o. female present for annual visit All past medical history, surgical history, allergies, family history, immunizations, medications and social history were updated in the electronic medical record today. All recent labs, ED visits and hospitalizations within the last year were reviewed.  Well women exam: Pt reports urinary incontinence with sneezing and coughing. She has had 2 vaginal births, largest baby 8 lbs 3 ounces. She wears a poise pad daily. She endorses not feeling like she has complete emptying of her bladder.  She desires a hysterectomy. She denies vaginal irritation, dysuria, discharge or dyspareunia. Patient's last menstrual period was 09/24/2015. She denies fhx of breast or colon cancer. She has had a recent mammogram and has a cyst in left her breast. She endorses new onset fatigue. She states she knows she has two jobs but she feels its more so that just her work load.    Health maintenance:  Colonoscopy: No family history, routine screening to start at 26 Mammogram: Mammogram up-to-date, history of left breast biopsy that was benign, last mammogram 08/24/2014. Cervical cancer screening: Indicated, patient with prior history of abnormal as a young adult, has not had a Pap in recent years. Immunizations: UTD flu and tdap, PNA indicated for smoking history Infectious disease screening: declined.  Assistive device: None  Oxygen use: None Patient has a Dental home. Hospitalizations/ED visits: reviewed.  Past Medical History  Diagnosis Date  . Reflux   . Complication of anesthesia     woke up before the tube was removed   . Vaginal delivery     x2  . Pneumonia     as a child- hosp.   Marland Kitchen Anxiety     use to use / take Lorazepam ., last episode of panic  2 yrs. ago  . GERD  (gastroesophageal reflux disease)   . Arthritis     hands, knees, back - Osteo  . Anemia   . History of chicken pox   . Facial nerve disorder   . Cystitis    Allergies  Allergen Reactions  . Doxycycline Nausea And Vomiting  . Hydrocodone Itching   Past Surgical History  Procedure Laterality Date  . Cervical discectomy    . Laparoscopic abdominal exploration      for endometrimosis   . Cholecystectomy N/A 06/16/2014    Procedure: LAPAROSCOPIC CHOLECYSTECTOMY ;  Surgeon: Coralie Keens, MD;  Location: [facility];  Service: General;  Laterality: N/A;  laparoscopic cholecystectomy  . Neck surgery  2014  . Breast biopsy Left 2014    fatty tissue only   Family History  Problem Relation Age of Onset  . Arthritis Mother   . Hyperlipidemia Mother   . Hypertension Mother   . Thyroid disease Mother     hypothyroid  . Arthritis Father   . Hyperlipidemia Father   . Hypertension Father   . Thyroid disease Brother   . Arthritis Maternal Grandmother   . Arthritis Maternal Grandfather   . Arthritis Paternal Grandmother   . Arthritis Paternal Grandfather    Social History   Social History  . Marital Status: Divorced    Spouse Name: N/A  . Number of Children: N/A  . Years of Education: N/A   Occupational History  . Not on file.  Social History Main Topics  . Smoking status: Current Every Day Smoker -- 0.50 packs/day    Types: Cigarettes  . Smokeless tobacco: Never Used  . Alcohol Use: Yes     Comment: rarely  . Drug Use: No  . Sexual Activity: Yes    Birth Control/ Protection: None   Other Topics Concern  . Not on file   Social History Narrative   Lives with Son and significant other. Engaged to be married.   Bus Therapist, occupational at the [employer]   Every day smoker   No etoh. No drugs. Wears her seatbelt.    Does not exercise daily.    Firearms present in the home in a gun safe.   Smoke alarm in the home.    Feel safe in relationships.      ROS: Negative,  with the exception of above mentioned in HPI  Objective: BP 116/74 mmHg  Pulse 74  Temp(Src) 98.1 F (36.7 C)  Resp 20  Ht _0  (1.676 m)  Wt 158 lb 8 oz (71.895 kg)  BMI 25.59 kg/m2  SpO2 98%  LMP 09/24/2015 Gen: Afebrile. No acute distress. Nontoxic in appearance, well-developed, well-nourished, female, pleasant.  HENT: AT. [place]. Bilateral TM visualized and normal in appearance, normal external auditory canal. MMM, no oral lesions, good dentition. Bilateral nares with  Mild erythema and swelling. Throat wihtout erythema, ulcerations or exudates. no Cough on exam, no hoarseness on exam. Eyes:Pupils Equal Round Reactive to light, Extraocular movements intact,  Conjunctiva without redness, discharge or icterus. Neck/lymp/endocrine: Supple, no lymphadenopathy, no thyromegaly CV: RRR no m/c/g/r, no edema, +2/4 P posterior tibialis pulses.  Chest: CTAB, no wheeze, rhonchi or crackles. Normal  Respiratory effort. Good Air movement. Abd: Soft. flat. NTND. BS present. No  Masses palpated. No hepatosplenomegaly. No rebound tenderness or guarding. Skin: no rashes, purpura or petechiae. Warm and well-perfused. Skin intact. Neuro/Msk:  Normal gait. PERLA. EOMi. Alert. Oriented x3.  Cranial nerves II through XII intact. Muscle strength 5/5 UE/LE extremity. DTRs equal bilaterally. Psych: Normal affect, dress and demeanor. Normal speech. Normal thought content and judgment.  GYN:  External genitalia within normal limits.  Vaginal mucosa pink, moist, normal rugae.  Nonfriable cervix without lesions, no discharge or bleeding noted on speculum exam.  Bimanual exam revealed normal, nongravid uterus.  No cervical motion tenderness. No adnexal masses bilaterally.     Assessment/plan: Brenda Ray is a 45 y.o. female present for annual exam.  1. Pap smear for cervical cancer screening - Cytology - PAP, collected with HPV today  2. Encounter for preventive health examination - Comp Met (CMET) - Lipid  panel.  Colonoscopy: No family history, routine screening to start at 73 Mammogram: Mammogram up-to-date, history of left breast biopsy that was benign, last mammogram 08/24/2014. performs SBE. Cervical cancer screening: completed with HPV today, patient with prior history of abnormal as a young adult, has not had a Pap in recent years. Immunizations: UTD flu and tdap, PNA indicated for smoking history but declined Infectious disease screening: declined HIV Patient was encouraged to exercise greater than 150 minutes a week. Patient was encouraged to choose a diet filled with fresh fruits and vegetables, and lean meats. AVS provided to patient today for education/recommendation on gender specific health and safety maintenance.  3. Other fatigue/iron deficiency anemia screening/Encounter for vitamin deficiency screening - Vitamin D (25 hydroxy) - B12 - Ferritin - CBC w/Diff - Ferritin - TSH  4. Stress incontinence - kegel exercise, pt  also desires hysterectomy, will refer to urogyn for discussion.  - referral placed  9. Screening for thyroid disorder - TSH  10. Screening for diabetes mellitus - HgB A1c   No Follow-up on file.  Electronically signed by: Howard Pouch, DO [place]

## 2015-10-01 NOTE — Telephone Encounter (Signed)
Please call pt: - her pap will take a few days to return.  - her labs resulted with low Vit D (25) and B12 (195)-->  (both can cause her symptoms).  - I have called in supplements in for both. She will needed to be tested 12 weeks for both after supplements completed. Lab appt only.  - all other labs are normal. She can sign on tomorrow chart to see all lab results.

## 2015-10-01 NOTE — Patient Instructions (Signed)
Urinary Incontinence Urinary incontinence is the involuntary loss of urine from your bladder. CAUSES  There are many causes of urinary incontinence. They include:  Medicines.  Infections.  Prostatic enlargement, leading to overflow of urine from your bladder.  Surgery.  Neurological diseases.  Emotional factors. SIGNS AND SYMPTOMS Urinary Incontinence can be divided into four types:  Urge incontinence. Urge incontinence is the involuntary loss of urine before you have the opportunity to go to the bathroom. There is a sudden urge to void but not enough time to reach a bathroom.  Stress incontinence. Stress incontinence is the sudden loss of urine with any activity that forces urine to pass. It is commonly caused by anatomical changes to the pelvis and sphincter areas of your body.  Overflow incontinence. Overflow incontinence is the loss of urine from an obstructed opening to your bladder. This results in a backup of urine and a resultant buildup of pressure within the bladder. When the pressure within the bladder exceeds the closing pressure of the sphincter, the urine overflows, which causes incontinence, similar to water overflowing a dam.  Total incontinence. Total incontinence is the loss of urine as a result of the inability to store urine within your bladder. DIAGNOSIS  Evaluating the cause of incontinence may require:  A thorough and complete medical and obstetric history.  A complete physical exam.  Laboratory tests such as a urine culture and sensitivities. When additional tests are indicated, they can include:  An ultrasound exam.  Kidney and bladder X-rays.  Cystoscopy. This is an exam of the bladder using a narrow scope.  Urodynamic testing to test the nerve function to the bladder and sphincter areas. TREATMENT  Treatment for urinary incontinence depends on the cause:  For urge incontinence caused by a bacterial infection, antibiotics will be prescribed. If  the urge incontinence is related to medicines you take, your health care provider may have you change the medicine.  For stress incontinence, surgery to re-establish anatomical support to the bladder or sphincter, or both, will often correct the condition.  For overflow incontinence caused by an enlarged prostate, an operation to open the channel through the enlarged prostate will allow the flow of urine out of the bladder. In women with fibroids, a hysterectomy may be recommended.  For total incontinence, surgery on your urinary sphincter may help. An artificial urinary sphincter (an inflatable cuff placed around the urethra) may be required. In women who have developed a hole-like passage between their bladder and vagina (vesicovaginal fistula), surgery to close the fistula often is required. HOME CARE INSTRUCTIONS  Normal daily hygiene and the use of pads or adult diapers that are changed regularly will help prevent odors and skin damage.  Avoid caffeine. It can overstimulate your bladder.  Use the bathroom regularly. Try about every 2-3 hours to go to the bathroom, even if you do not feel the need to do so. Take time to empty your bladder completely. After urinating, wait a minute. Then try to urinate again.  For causes involving nerve dysfunction, keep a log of the medicines you take and a journal of the times you go to the bathroom. SEEK MEDICAL CARE IF:  You experience worsening of pain instead of improvement in pain after your procedure.  Your incontinence becomes worse instead of better. SEE IMMEDIATE MEDICAL CARE IF:  You experience fever or shaking chills.  You are unable to pass your urine.  You have redness spreading into your groin or down into your thighs. MAKE SURE  YOU:   Understand these instructions.   Will watch your condition.  Will get help right away if you are not doing well or get worse.   This information is not intended to replace advice given to you by  your health care provider. Make sure you discuss any questions you have with your health care provider.   Document Released: 08/21/2004 Document Revised: 08/04/2014 Document Reviewed: 12/21/2012 Elsevier Interactive Patient Education 2016 [place] Maintenance, Female Adopting a healthy lifestyle and getting preventive care can go a long way to promote health and wellness. Talk with your health care provider about what schedule of regular examinations is right for you. This is a good chance for you to check in with your provider about disease prevention and staying healthy. In between checkups, there are plenty of things you can do on your own. Experts have done a lot of research about which lifestyle changes and preventive measures are most likely to keep you healthy. Ask your health care provider for more information. WEIGHT AND DIET  Eat a healthy diet  Be sure to include plenty of vegetables, fruits, low-fat dairy products, and lean protein.  Do not eat a lot of foods high in solid fats, added sugars, or salt.  Get regular exercise. This is one of the most important things you can do for your health.  Most adults should exercise for at least 150 minutes each week. The exercise should increase your heart rate and make you sweat (moderate-intensity exercise).  Most adults should also do strengthening exercises at least twice a week. This is in addition to the moderate-intensity exercise.  Maintain a healthy weight  Body mass index (BMI) is a measurement that can be used to identify possible weight problems. It estimates body fat based on height and weight. Your health care provider can help determine your BMI and help you achieve or maintain a healthy weight.  For females 38 years of age and older:   A BMI below 18.5 is considered underweight.  A BMI of 18.5 to 24.9 is normal.  A BMI of 25 to 29.9 is considered overweight.  A BMI of 30 and above is considered obese.   Watch levels of cholesterol and blood lipids  You should start having your blood tested for lipids and cholesterol at 47 years of age, then have this test every 5 years.  You may need to have your cholesterol levels checked more often if:  Your lipid or cholesterol levels are high.  You are older than 47 years of age.  You are at high risk for heart disease.  CANCER SCREENING   Lung Cancer  Lung cancer screening is recommended for adults 82-11 years old who are at high risk for lung cancer because of a history of smoking.  A yearly low-dose CT scan of the lungs is recommended for people who:  Currently smoke.  Have quit within the past 15 years.  Have at least a 30-pack-year history of smoking. A pack year is smoking an average of one pack of cigarettes a day for 1 year.  Yearly screening should continue until it has been 15 years since you quit.  Yearly screening should stop if you develop a health problem that would prevent you from having lung cancer treatment.  Breast Cancer  Practice breast self-awareness. This means understanding how your breasts normally appear and feel.  It also means doing regular breast self-exams. Let your health care provider know about any changes, no  matter how small.  If you are in your 20s or 30s, you should have a clinical breast exam (CBE) by a health care provider every 1-3 years as part of a regular health exam.  If you are 92 or older, have a CBE every year. Also consider having a breast X-ray (mammogram) every year.  If you have a family history of breast cancer, talk to your health care provider about genetic screening.  If you are at high risk for breast cancer, talk to your health care provider about having an MRI and a mammogram every year.  Breast cancer gene (BRCA) assessment is recommended for women who have family members with BRCA-related cancers. BRCA-related cancers  include:  Breast.  Ovarian.  Tubal.  Peritoneal cancers.  Results of the assessment will determine the need for genetic counseling and BRCA1 and BRCA2 testing. Cervical Cancer Your health care provider may recommend that you be screened regularly for cancer of the pelvic organs (ovaries, uterus, and vagina). This screening involves a pelvic examination, including checking for microscopic changes to the surface of your cervix (Pap test). You may be encouraged to have this screening done every 3 years, beginning at age 75.  For women ages 78-65, health care providers may recommend pelvic exams and Pap testing every 3 years, or they may recommend the Pap and pelvic exam, combined with testing for human papilloma virus (HPV), every 5 years. Some types of HPV increase your risk of cervical cancer. Testing for HPV may also be done on women of any age with unclear Pap test results.  Other health care providers may not recommend any screening for nonpregnant women who are considered low risk for pelvic cancer and who do not have symptoms. Ask your health care provider if a screening pelvic exam is right for you.  If you have had past treatment for cervical cancer or a condition that could lead to cancer, you need Pap tests and screening for cancer for at least 20 years after your treatment. If Pap tests have been discontinued, your risk factors (such as having a new sexual partner) need to be reassessed to determine if screening should resume. Some women have medical problems that increase the chance of getting cervical cancer. In these cases, your health care provider may recommend more frequent screening and Pap tests. Colorectal Cancer  This type of cancer can be detected and often prevented.  Routine colorectal cancer screening usually begins at 47 years of age and continues through 47 years of age.  Your health care provider may recommend screening at an earlier age if you have risk factors for  colon cancer.  Your health care provider may also recommend using home test kits to check for hidden blood in the stool.  A small camera at the end of a tube can be used to examine your colon directly (sigmoidoscopy or colonoscopy). This is done to check for the earliest forms of colorectal cancer.  Routine screening usually begins at age 33.  Direct examination of the colon should be repeated every 5-10 years through 47 years of age. However, you may need to be screened more often if early forms of precancerous polyps or small growths are found. Skin Cancer  Check your skin from head to toe regularly.  Tell your health care provider about any new moles or changes in moles, especially if there is a change in a mole's shape or color.  Also tell your health care provider if you have a mole that  is larger than the size of a pencil eraser.  Always use sunscreen. Apply sunscreen liberally and repeatedly throughout the day.  Protect yourself by wearing long sleeves, pants, a wide-brimmed hat, and sunglasses whenever you are outside. HEART DISEASE, DIABETES, AND HIGH BLOOD PRESSURE   High blood pressure causes heart disease and increases the risk of stroke. High blood pressure is more likely to develop in:  People who have blood pressure in the high end of the normal range (130-139/85-89 mm Hg).  People who are overweight or obese.  People who are African American.  If you are 9-49 years of age, have your blood pressure checked every 3-5 years. If you are 37 years of age or older, have your blood pressure checked every year. You should have your blood pressure measured twice--once when you are at a hospital or clinic, and once when you are not at a hospital or clinic. Record the average of the two measurements. To check your blood pressure when you are not at a hospital or clinic, you can use:  An automated blood pressure machine at a pharmacy.  A home blood pressure monitor.  If you  are between 66 years and 38 years old, ask your health care provider if you should take aspirin to prevent strokes.  Have regular diabetes screenings. This involves taking a blood sample to check your fasting blood sugar level.  If you are at a normal weight and have a low risk for diabetes, have this test once every three years after 47 years of age.  If you are overweight and have a high risk for diabetes, consider being tested at a younger age or more often. PREVENTING INFECTION  Hepatitis B  If you have a higher risk for hepatitis B, you should be screened for this virus. You are considered at high risk for hepatitis B if:  You were born in a country where hepatitis B is common. Ask your health care provider which countries are considered high risk.  Your parents were born in a high-risk country, and you have not been immunized against hepatitis B (hepatitis B vaccine).  You have HIV or AIDS.  You use needles to inject street drugs.  You live with someone who has hepatitis B.  You have had sex with someone who has hepatitis B.  You get hemodialysis treatment.  You take certain medicines for conditions, including cancer, organ transplantation, and autoimmune conditions. Hepatitis C  Blood testing is recommended for:  Everyone born from [birth year] through 1967.  Anyone with known risk factors for hepatitis C. Sexually transmitted infections (STIs)  You should be screened for sexually transmitted infections (STIs) including gonorrhea and chlamydia if:  You are sexually active and are younger than 47 years of age.  You are older than 47 years of age and your health care provider tells you that you are at risk for this type of infection.  Your sexual activity has changed since you were last screened and you are at an increased risk for chlamydia or gonorrhea. Ask your health care provider if you are at risk.  If you do not have HIV, but are at risk, it may be recommended that you  take a prescription medicine daily to prevent HIV infection. This is called pre-exposure prophylaxis (PrEP). You are considered at risk if:  You are sexually active and do not regularly use condoms or know the HIV status of your partner(s).  You take drugs by injection.  You are sexually active  with a partner who has HIV. Talk with your health care provider about whether you are at high risk of being infected with HIV. If you choose to begin PrEP, you should first be tested for HIV. You should then be tested every 3 months for as long as you are taking PrEP.  PREGNANCY   If you are premenopausal and you may become pregnant, ask your health care provider about preconception counseling.  If you may become pregnant, take 400 to 800 micrograms (mcg) of folic acid every day.  If you want to prevent pregnancy, talk to your health care provider about birth control (contraception). OSTEOPOROSIS AND MENOPAUSE   Osteoporosis is a disease in which the bones lose minerals and strength with aging. This can result in serious bone fractures. Your risk for osteoporosis can be identified using a bone density scan.  If you are 1 years of age or older, or if you are at risk for osteoporosis and fractures, ask your health care provider if you should be screened.  Ask your health care provider whether you should take a calcium or vitamin D supplement to lower your risk for osteoporosis.  Menopause may have certain physical symptoms and risks.  Hormone replacement therapy may reduce some of these symptoms and risks. Talk to your health care provider about whether hormone replacement therapy is right for you.  HOME CARE INSTRUCTIONS   Schedule regular health, dental, and eye exams.  Stay current with your immunizations.   Do not use any tobacco products including cigarettes, chewing tobacco, or electronic cigarettes.  If you are pregnant, do not drink alcohol.  If you are breastfeeding, limit how  much and how often you drink alcohol.  Limit alcohol intake to no more than 1 drink per day for nonpregnant women. One drink equals 12 ounces of beer, 5 ounces of wine, or 1 ounces of hard liquor.  Do not use street drugs.  Do not share needles.  Ask your health care provider for help if you need support or information about quitting drugs.  Tell your health care provider if you often feel depressed.  Tell your health care provider if you have ever been abused or do not feel safe at home.   This information is not intended to replace advice given to you by your health care provider. Make sure you discuss any questions you have with your health care provider.   Document Released: 01/27/2011 Document Revised: 08/04/2014 Document Reviewed: 06/15/2013 Elsevier Interactive Patient Education [employer].

## 2015-10-02 ENCOUNTER — Telehealth: Payer: Self-pay | Admitting: Family Medicine

## 2015-10-02 LAB — CYTOLOGY - PAP

## 2015-10-02 NOTE — Telephone Encounter (Signed)
Spoke with patient reviewed lab results and instructions. Patient verbalized understanding. 

## 2015-10-02 NOTE — Telephone Encounter (Signed)
Patient notified

## 2015-10-02 NOTE — Telephone Encounter (Signed)
Please call patient, her Pap was normal with negative HPV.

## 2015-10-16 ENCOUNTER — Encounter: Payer: Self-pay | Admitting: Family Medicine

## 2015-10-16 ENCOUNTER — Ambulatory Visit (INDEPENDENT_AMBULATORY_CARE_PROVIDER_SITE_OTHER): Payer: BC Managed Care – PPO | Admitting: Family Medicine

## 2015-10-16 VITALS — BP 118/76 | HR 79 | Temp 98.0°F | Resp 20 | Wt 158.8 lb

## 2015-10-16 DIAGNOSIS — R112 Nausea with vomiting, unspecified: Secondary | ICD-10-CM

## 2015-10-16 DIAGNOSIS — R6889 Other general symptoms and signs: Secondary | ICD-10-CM

## 2015-10-16 MED ORDER — ONDANSETRON HCL 4 MG PO TABS
4.0000 mg | ORAL_TABLET | Freq: Three times a day (TID) | ORAL | Status: DC | PRN
Start: 2015-10-16 — End: 2016-05-05

## 2015-10-16 MED ORDER — OSELTAMIVIR PHOSPHATE 75 MG PO CAPS: 75.0000 mg | ORAL_CAPSULE | Freq: Two times a day (BID) | ORAL | Status: DC

## 2015-10-16 NOTE — Patient Instructions (Signed)

## 2015-10-16 NOTE — Progress Notes (Signed)
Patient ID: Brenda Ray, female   DOB: 20-Feb-1971, 45 y.o.   MRN: [id number]    Brenda Ray , 08/06/1970, 45 y.o., female MRN: [id number]  CC: diarrhea Subjective: Pt presents for an acute OV with complaints of diarrhea, emesis, body aches, headache, sore throat, chills and fever of 1 days duration.  Pt has tried nothing  to ease their symptoms.  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  UTD flu and td Everyday smoker Allergies  Allergen Reactions  . Doxycycline Nausea And Vomiting  . Hydrocodone Itching   Social History  Substance Use Topics  . Smoking status: Current Every Day Smoker -- 0.50 packs/day    Types: Cigarettes  . Smokeless tobacco: Never Used  . Alcohol Use: Yes     Comment: rarely   Past Medical History  Diagnosis Date  . Reflux   . Complication of anesthesia     woke up before the tube was removed   . Vaginal delivery     x2  . Pneumonia     as a child- hosp.   Marland Kitchen Anxiety     use to use / take Lorazepam ., last episode of panic  2 yrs. ago  . GERD (gastroesophageal reflux disease)   . Arthritis     hands, knees, back - Osteo  . Anemia   . History of chicken pox   . Facial nerve disorder   . Cystitis    Past Surgical History  Procedure Laterality Date  . Cervical discectomy    . Laparoscopic abdominal exploration      for endometrimosis   . Cholecystectomy N/A 06/16/2014    Procedure: LAPAROSCOPIC CHOLECYSTECTOMY ;  Surgeon: Abigail Miyamoto, MD;  [facility];  Service: General;  Laterality: N/A;  laparoscopic cholecystectomy  . Neck surgery  2014  . Breast biopsy Left 2014    fatty tissue only   Family History  Problem Relation Age of Onset  . Arthritis Mother   . Hyperlipidemia Mother   . Hypertension Mother   . Thyroid disease Mother     hypothyroid  . Arthritis Father   . Hyperlipidemia Father   . Hypertension Father   . Thyroid disease Brother   . Arthritis Maternal Grandmother   . Arthritis Maternal Grandfather   .  Arthritis Paternal Grandmother   . Arthritis Paternal Grandfather      Medication List       This list is accurate as of: 10/16/15 11:17 AM.  Always use your most recent med list.               fluticasone 50 MCG/ACT nasal spray  Commonly known as:  FLONASE  Place 2 sprays into both nostrils daily.     vitamin B-12 1000 MCG tablet  Commonly known as:  CYANOCOBALAMIN  1000 mcg daily for 7 days and then 1000 mcg weekly     Vitamin D (Ergocalciferol) 50000 units Caps capsule  Commonly known as:  DRISDOL  Take 1 capsule (50,000 Units total) by mouth every 7 (seven) days.         ROS: Negative, with the exception of above mentioned in HPI   Objective:  BP 118/76 mmHg  Pulse 79  Temp(Src) 98 F (36.7 C)  Resp 20  Wt 158 lb 12 oz (72.009 kg)  SpO2 95%  LMP 09/24/2015 Body mass index is 25.64 kg/(m^2). Gen: Afebrile. No acute distress. ** HENT: AT. [place]. Bilateral TM visualized, left with air fluid level,  no erythema, or bulging.  MMM, no oral lesions. Bilateral nares with erythema and severe  Swelling.  Throat without erythema or exudates. No cough, hoarseness of TTP facial sinus on exam.  Eyes:Pupils Equal Round Reactive to light, Extraocular movements intact,  Conjunctiva without redness, discharge or icterus. Neck/lymp/endocrine: Supple, no lymphadenopathy CV: RRR  Chest: CTAB, no wheeze or crackles. Good air movement, normal resp effort.  Abd: Soft.  NTND. BS present  Skin: no rashes, purpura or petechiae.  Neuro: Normal gait. PERLA. EOMi. Alert. Oriented x3   Assessment/Plan: Brenda Ray is a 45 y.o. female present for acute OV for  Flu-like symptoms/nausea/vomit - oseltamivir (TAMIFLU) 75 MG capsule; Take 1 capsule (75 mg total) by mouth 2 (two) times daily.  Dispense: 10 capsule; Refill: 0 - ondansetron (ZOFRAN) 4 MG tablet; Take 1 tablet (4 mg total) by mouth every 8 (eight) hours as needed for nausea or vomiting.  Dispense: 20 tablet; Refill: 0 - work excuse,  return on Friday -  flonase, rest, hydrate.  - F/U PRN  electronically signed by:  Felix Pacinienee Kuneff, DO  [facility] - [place]

## 2015-10-18 ENCOUNTER — Telehealth: Payer: Self-pay | Admitting: Family Medicine

## 2015-10-18 NOTE — Telephone Encounter (Signed)
Patient's boss is reading note as stating she will be able to return to work tomorrow 3/24. Patient is still running low grade fever. Can a note be sent to her employer excusing her from work tomorrow? [facility] [employer] Attn: Enid BaasJudy Patterson fax [phone]. Patient requesting CB at her home # when complete.

## 2015-10-18 NOTE — Telephone Encounter (Signed)
Note completed. Note faxed as requested. Patient notified.

## 2016-01-03 ENCOUNTER — Telehealth: Payer: Self-pay | Admitting: Family Medicine

## 2016-01-03 DIAGNOSIS — E539 Vitamin B deficiency, unspecified: Secondary | ICD-10-CM

## 2016-01-03 DIAGNOSIS — E559 Vitamin D deficiency, unspecified: Secondary | ICD-10-CM

## 2016-01-03 NOTE — Telephone Encounter (Signed)
Patient will make her appt next week

## 2016-01-03 NOTE — Telephone Encounter (Signed)
Orders placed Ok to schedule patient for lab appt only per Dr Claiborne BillingsKuneff.

## 2016-01-03 NOTE — Telephone Encounter (Signed)
Patient would like to come in Monday for her labs to check her Vit D & B12. Please call her back. 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

## 2016-01-08 ENCOUNTER — Other Ambulatory Visit (INDEPENDENT_AMBULATORY_CARE_PROVIDER_SITE_OTHER): Payer: BC Managed Care – PPO

## 2016-01-08 ENCOUNTER — Telehealth: Payer: Self-pay | Admitting: Family Medicine

## 2016-01-08 DIAGNOSIS — E539 Vitamin B deficiency, unspecified: Secondary | ICD-10-CM

## 2016-01-08 DIAGNOSIS — E559 Vitamin D deficiency, unspecified: Secondary | ICD-10-CM | POA: Diagnosis not present

## 2016-01-08 LAB — VITAMIN B12: VITAMIN B 12: 198 pg/mL — AB (ref 211–911)

## 2016-01-08 LAB — VITAMIN D 25 HYDROXY (VIT D DEFICIENCY, FRACTURES): VITD: 67.07 ng/mL (ref 30.00–100.00)

## 2016-01-08 NOTE — Telephone Encounter (Signed)
Patient aware. She states she will wait til lab results come back.

## 2016-01-08 NOTE — Telephone Encounter (Signed)
Patient came in today for vitamin b12 and vitamin d labs.  Patient would like to take vit b12 supplement daily if possible.  I told her that you would probably want to see these results first but patient wanted me to ask.  Please advise.

## 2016-01-08 NOTE — Telephone Encounter (Signed)
We will call pt with results of labs once available. However, it is perfectly safe for her to take an OTC B12 daily (she requested a daily b12). The doses are variable OTC 100mg  or 1000mcg are an ok daily dose. I would recommend she wait until her labs returned in the event we find she does not absorb b12 well, because then her therapy would change.

## 2016-01-08 NOTE — Telephone Encounter (Signed)
Please call pt: - her vit d level looks great. Continue OTC supplement [phone] u daily to keep level normal.  - her b12 is basically unchanged and low. If she took the medication as prescribed and level is still this low she will need to try b12 injections. She can get the injections her- scheduled.  - If she was taking as directed will order IM injections if she is agreeable. If not we can try daily supplement oral for 8 weeks and retest again to make sure she is absorbing.  - if she wants injections here would schedule once a week for 4 weeks, then every other week for 2 doses, then monthly. Would retest after 3 months. Please advise.

## 2016-01-09 ENCOUNTER — Ambulatory Visit (INDEPENDENT_AMBULATORY_CARE_PROVIDER_SITE_OTHER): Payer: BC Managed Care – PPO | Admitting: Family Medicine

## 2016-01-09 DIAGNOSIS — E538 Deficiency of other specified B group vitamins: Secondary | ICD-10-CM

## 2016-01-09 MED ORDER — CYANOCOBALAMIN 1000 MCG/ML IJ SOLN
1000.0000 ug | Freq: Once | INTRAMUSCULAR | Status: AC
  Administered 2016-01-09: 1000 ug via INTRAMUSCULAR

## 2016-01-09 NOTE — Telephone Encounter (Signed)
Left message for patient to return call.

## 2016-01-09 NOTE — Telephone Encounter (Signed)
Patient aware of results.  Pt agreed to do b12 injections.  She wanted to start today.  Nurse appt scheduled.

## 2016-01-16 ENCOUNTER — Ambulatory Visit (INDEPENDENT_AMBULATORY_CARE_PROVIDER_SITE_OTHER): Payer: BC Managed Care – PPO | Admitting: *Deleted

## 2016-01-16 DIAGNOSIS — E538 Deficiency of other specified B group vitamins: Secondary | ICD-10-CM

## 2016-01-16 MED ORDER — CYANOCOBALAMIN 1000 MCG/ML IJ SOLN
1000.0000 ug | Freq: Once | INTRAMUSCULAR | Status: AC
  Administered 2016-01-16: 1000 ug via INTRAMUSCULAR

## 2016-01-16 NOTE — Progress Notes (Signed)
Patient presents today for B 12 injection #2. Patient tolerated injection well. Next injection next week. Patient ordered 4 weekly injections,2 every other week then monthly injections.

## 2016-01-23 ENCOUNTER — Ambulatory Visit (INDEPENDENT_AMBULATORY_CARE_PROVIDER_SITE_OTHER): Payer: BC Managed Care – PPO | Admitting: Family Medicine

## 2016-01-23 DIAGNOSIS — E538 Deficiency of other specified B group vitamins: Secondary | ICD-10-CM

## 2016-01-23 MED ORDER — CYANOCOBALAMIN 1000 MCG/ML IJ SOLN
1000.0000 ug | Freq: Once | INTRAMUSCULAR | Status: AC
  Administered 2016-01-23: 1000 ug via INTRAMUSCULAR

## 2016-01-30 ENCOUNTER — Ambulatory Visit (INDEPENDENT_AMBULATORY_CARE_PROVIDER_SITE_OTHER): Payer: BC Managed Care – PPO | Admitting: Family Medicine

## 2016-01-30 DIAGNOSIS — E538 Deficiency of other specified B group vitamins: Secondary | ICD-10-CM | POA: Diagnosis not present

## 2016-01-30 MED ORDER — CYANOCOBALAMIN 1000 MCG/ML IJ SOLN
1000.0000 ug | Freq: Once | INTRAMUSCULAR | Status: AC
  Administered 2016-01-30: 1000 ug via INTRAMUSCULAR

## 2016-02-13 ENCOUNTER — Ambulatory Visit (INDEPENDENT_AMBULATORY_CARE_PROVIDER_SITE_OTHER): Payer: BC Managed Care – PPO | Admitting: Family Medicine

## 2016-02-13 DIAGNOSIS — E538 Deficiency of other specified B group vitamins: Secondary | ICD-10-CM

## 2016-02-13 MED ORDER — CYANOCOBALAMIN 1000 MCG/ML IJ SOLN
1000.0000 ug | Freq: Once | INTRAMUSCULAR | Status: AC
  Administered 2016-02-13: 1000 ug via INTRAMUSCULAR

## 2016-02-25 ENCOUNTER — Ambulatory Visit (INDEPENDENT_AMBULATORY_CARE_PROVIDER_SITE_OTHER): Payer: BC Managed Care – PPO | Admitting: Family Medicine

## 2016-02-25 DIAGNOSIS — E538 Deficiency of other specified B group vitamins: Secondary | ICD-10-CM

## 2016-02-25 MED ORDER — CYANOCOBALAMIN 1000 MCG/ML IJ SOLN
1000.0000 ug | Freq: Once | INTRAMUSCULAR | Status: AC
  Administered 2016-02-25: 1000 ug via INTRAMUSCULAR

## 2016-03-26 ENCOUNTER — Ambulatory Visit (INDEPENDENT_AMBULATORY_CARE_PROVIDER_SITE_OTHER): Payer: BC Managed Care – PPO | Admitting: Family Medicine

## 2016-03-26 DIAGNOSIS — E538 Deficiency of other specified B group vitamins: Secondary | ICD-10-CM

## 2016-03-26 MED ORDER — CYANOCOBALAMIN 1000 MCG/ML IJ SOLN
1000.0000 ug | Freq: Once | INTRAMUSCULAR | Status: AC
  Administered 2016-03-26: 1000 ug via INTRAMUSCULAR

## 2016-03-27 ENCOUNTER — Ambulatory Visit: Payer: BC Managed Care – PPO

## 2016-04-23 ENCOUNTER — Ambulatory Visit (INDEPENDENT_AMBULATORY_CARE_PROVIDER_SITE_OTHER): Payer: BC Managed Care – PPO | Admitting: Family Medicine

## 2016-04-23 DIAGNOSIS — E538 Deficiency of other specified B group vitamins: Secondary | ICD-10-CM | POA: Diagnosis not present

## 2016-04-23 MED ORDER — CYANOCOBALAMIN 1000 MCG/ML IJ SOLN
1000.0000 ug | Freq: Once | INTRAMUSCULAR | Status: AC
  Administered 2016-04-23: 1000 ug via INTRAMUSCULAR

## 2016-04-23 NOTE — Progress Notes (Signed)
Noted. Electronically Signed by: Felix Pacinienee Jessaca Philippi, DO [facility] [place]

## 2016-05-05 ENCOUNTER — Encounter: Payer: Self-pay | Admitting: Family Medicine

## 2016-05-05 ENCOUNTER — Ambulatory Visit (INDEPENDENT_AMBULATORY_CARE_PROVIDER_SITE_OTHER): Payer: BC Managed Care – PPO | Admitting: Family Medicine

## 2016-05-05 VITALS — BP 119/73 | HR 79 | Temp 98.3°F | Resp 18 | Wt 155.8 lb

## 2016-05-05 DIAGNOSIS — J0100 Acute maxillary sinusitis, unspecified: Secondary | ICD-10-CM

## 2016-05-05 MED ORDER — AMOXICILLIN-POT CLAVULANATE 875-125 MG PO TABS: 1.0000 | ORAL_TABLET | Freq: Two times a day (BID) | ORAL | 0 refills | Status: DC

## 2016-05-05 MED ORDER — METHYLPREDNISOLONE ACETATE 80 MG/ML IJ SUSP
80.0000 mg | Freq: Once | INTRAMUSCULAR | Status: AC
  Administered 2016-05-05: 80 mg via INTRAMUSCULAR

## 2016-05-05 NOTE — Progress Notes (Signed)
Brenda Ray , Sep 15, 1970, 45 y.o., female MRN: [id number] Patient Care Team    Relationship Specialty Notifications Start End  Natalia Leatherwoodenee A Madelline Eshbach, DO PCP - General Family Medicine  05/01/15     CC: sinus pain Subjective: Pt presents for an acute OV with complaints of sinus pain of > 5 days duratioin duration.  Associated symptoms include teeth pain, headache, cough, hoarseness, sinus pressure.  Pt has tried Mucinex, flonase, saline flushes.  to ease their symptoms. She denies chest pain, shortness of breath,wheezing, vomiting.   Allergies  Allergen Reactions  . Doxycycline Nausea And Vomiting  . Hydrocodone Itching   Social History  Substance Use Topics  . Smoking status: Current Every Day Smoker    Packs/day: 0.50    Types: Cigarettes  . Smokeless tobacco: Never Used  . Alcohol use Yes     Comment: rarely   Past Medical History:  Diagnosis Date  . Anemia   . Anxiety    use to use / take Lorazepam ., last episode of panic  2 yrs. ago  . Arthritis    hands, knees, back - Osteo  . Complication of anesthesia    woke up before the tube was removed   . Cystitis   . Facial nerve disorder   . GERD (gastroesophageal reflux disease)   . History of chicken pox   . Pneumonia    as a child- hosp.   Marland Kitchen. Reflux   . Vaginal delivery    x2   Past Surgical History:  Procedure Laterality Date  . BREAST BIOPSY Left 2014   fatty tissue only  . CERVICAL DISCECTOMY    . CHOLECYSTECTOMY N/A 06/16/2014   Procedure: LAPAROSCOPIC CHOLECYSTECTOMY ;  Surgeon: Abigail Miyamotoouglas Blackman, MD;  [facility];  Service: General;  Laterality: N/A;  laparoscopic cholecystectomy  . LAPAROSCOPIC ABDOMINAL EXPLORATION     for endometrimosis   . NECK SURGERY  2014   Family History  Problem Relation Age of Onset  . Arthritis Mother   . Hyperlipidemia Mother   . Hypertension Mother   . Thyroid disease Mother     hypothyroid  . Arthritis Father   . Hyperlipidemia Father   . Hypertension Father   .  Thyroid disease Brother   . Arthritis Maternal Grandmother   . Arthritis Maternal Grandfather   . Arthritis Paternal Grandmother   . Arthritis Paternal Grandfather      Medication List       Accurate as of 05/05/16 10:50 AM. Always use your most recent med list.          fluticasone 50 MCG/ACT nasal spray Commonly known as:  FLONASE Place 2 sprays into both nostrils daily.   vitamin B-12 1000 MCG tablet Commonly known as:  CYANOCOBALAMIN 1000 mcg daily for 7 days and then 1000 mcg weekly       No results found for this or any previous visit (from the past 24 hour(s)). No results found.   ROS: Negative, with the exception of above mentioned in HPI   Objective:  BP 119/73 (BP Location: Right Arm, Patient Position: Sitting, Cuff Size: Normal)   Pulse 79   Temp 98.3 F (36.8 C)   Resp 18   Wt 155 lb 12 oz (70.6 kg)   LMP 04/25/2016 (Approximate)   SpO2 97%   BMI 25.14 kg/m  Body mass index is 25.14 kg/m. Gen: Afebrile. No acute distress. Nontoxic in appearance, well developed, well nourished. fatigued appearing.  HENT: AT. [place].  Bilateral TM visualized with mild erythema and air fluid levels. MMM, no oral lesions. Bilateral nares with erythema and swelling. Throat without erythema or exudates. Hoarsness, cough and TTP max sinus present.  Eyes:Pupils Equal Round Reactive to light, Extraocular movements intact,  Conjunctiva without redness, discharge or icterus. Neck/lymp/endocrine: Supple,ant cervical  lymphadenopathy CV: RRR, no edema Chest: CTAB, no wheeze or crackles. Good air movement, normal resp effort.  Abd: Soft. NTND. BS present.  Skin: no rashes, purpura or petechiae.  Neuro:  Normal gait. PERLA. EOMi. Alert. Oriented x3   Assessment/Plan: Brenda Ray is a 45 y.o. female present for acute OV for  - Flonase, nasal saline, mucinex.  - rest and hydrate. - Augmentin x 10days - IM Depo today.  - F/U PRN  > 25 minutes spent with patient, >50% of time  spent face to face counseling patient and coordinating care.   electronically signed by:  Felix Pacini, DO  [facility] - [place]

## 2016-05-05 NOTE — Patient Instructions (Signed)
Continue flonase, nasal saline and mucinex DM. Start Augmentin with food, take every 12 hours for 10 days.   Rest, hydrate, NSAIDS for headache     Sinusitis, Adult Sinusitis is redness, soreness, and inflammation of the paranasal sinuses. Paranasal sinuses are air pockets within the bones of your face. They are located beneath your eyes, in the middle of your forehead, and above your eyes. In healthy paranasal sinuses, mucus is able to drain out, and air is able to circulate through them by way of your nose. However, when your paranasal sinuses are inflamed, mucus and air can become trapped. This can allow bacteria and other germs to grow and cause infection. Sinusitis can develop quickly and last only a short time (acute) or continue over a long period (chronic). Sinusitis that lasts for more than 12 weeks is considered chronic. CAUSES Causes of sinusitis include:  Allergies.  Structural abnormalities, such as displacement of the cartilage that separates your nostrils (deviated septum), which can decrease the air flow through your nose and sinuses and affect sinus drainage.  Functional abnormalities, such as when the small hairs (cilia) that line your sinuses and help remove mucus do not work properly or are not present. SIGNS AND SYMPTOMS Symptoms of acute and chronic sinusitis are the same. The primary symptoms are pain and pressure around the affected sinuses. Other symptoms include:  Upper toothache.  Earache.  Headache.  Bad breath.  Decreased sense of smell and taste.  A cough, which worsens when you are lying flat.  Fatigue.  Fever.  Thick drainage from your nose, which often is green and may contain pus (purulent).  Swelling and warmth over the affected sinuses. DIAGNOSIS Your health care provider will perform a physical exam. During your exam, your health care provider may perform any of the following to help determine if you have acute sinusitis or chronic  sinusitis:  Look in your nose for signs of abnormal growths in your nostrils (nasal polyps).  Tap over the affected sinus to check for signs of infection.  View the inside of your sinuses using an imaging device that has a light attached (endoscope). If your health care provider suspects that you have chronic sinusitis, one or more of the following tests may be recommended:  Allergy tests.  Nasal culture. A sample of mucus is taken from your nose, sent to a lab, and screened for bacteria.  Nasal cytology. A sample of mucus is taken from your nose and examined by your health care provider to determine if your sinusitis is related to an allergy. TREATMENT Most cases of acute sinusitis are related to a viral infection and will resolve on their own within 10 days. Sometimes, medicines are prescribed to help relieve symptoms of both acute and chronic sinusitis. These may include pain medicines, decongestants, nasal steroid sprays, or saline sprays. However, for sinusitis related to a bacterial infection, your health care provider will prescribe antibiotic medicines. These are medicines that will help kill the bacteria causing the infection. Rarely, sinusitis is caused by a fungal infection. In these cases, your health care provider will prescribe antifungal medicine. For some cases of chronic sinusitis, surgery is needed. Generally, these are cases in which sinusitis recurs more than 3 times per year, despite other treatments. HOME CARE INSTRUCTIONS  Drink plenty of water. Water helps thin the mucus so your sinuses can drain more easily.  Use a humidifier.  Inhale steam 3-4 times a day (for example, sit in the bathroom with the shower  running).  Apply a warm, moist washcloth to your face 3-4 times a day, or as directed by your health care provider.  Use saline nasal sprays to help moisten and clean your sinuses.  Take medicines only as directed by your health care provider.  If you were  prescribed either an antibiotic or antifungal medicine, finish it all even if you start to feel better. SEEK IMMEDIATE MEDICAL CARE IF:  You have increasing pain or severe headaches.  You have nausea, vomiting, or drowsiness.  You have swelling around your face.  You have vision problems.  You have a stiff neck.  You have difficulty breathing.   This information is not intended to replace advice given to you by your health care provider. Make sure you discuss any questions you have with your health care provider.   Document Released: 07/14/2005 Document Revised: 08/04/2014 Document Reviewed: 07/29/2011 Elsevier Interactive Patient Education [employer].

## 2016-05-06 ENCOUNTER — Telehealth: Payer: Self-pay | Admitting: Family Medicine

## 2016-05-06 MED ORDER — LEVOFLOXACIN 750 MG PO TABS: 750.0000 mg | ORAL_TABLET | Freq: Every day | ORAL | 0 refills | Status: DC

## 2016-05-06 NOTE — Telephone Encounter (Signed)
Patient calling to report she was prescribed amoxicillin-clavulanate (AUGMENTIN) 875-125 MG tablet yesterday and she experienced severe abdominal cramps, diarrhea and nausea last night.    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  Patient requesting a call back with advise on what she should do.

## 2016-05-06 NOTE — Telephone Encounter (Signed)
Pt had same reaction to doxycyline. I will call in short course of Levaquin for her.

## 2016-05-06 NOTE — Telephone Encounter (Signed)
Left message on patient voice mail with instructions to stop augmentin start levoquin.

## 2016-05-21 ENCOUNTER — Ambulatory Visit (INDEPENDENT_AMBULATORY_CARE_PROVIDER_SITE_OTHER): Payer: BC Managed Care – PPO

## 2016-05-21 DIAGNOSIS — E538 Deficiency of other specified B group vitamins: Secondary | ICD-10-CM

## 2016-05-21 MED ORDER — CYANOCOBALAMIN 1000 MCG/ML IJ SOLN
1000.0000 ug | Freq: Once | INTRAMUSCULAR | Status: AC
  Administered 2016-05-21: 1000 ug via INTRAMUSCULAR

## 2016-05-21 NOTE — Progress Notes (Signed)
Vitamin B 12 given Left deltoid with no incident or complaints.

## 2016-06-30 ENCOUNTER — Telehealth: Payer: Self-pay | Admitting: Family Medicine

## 2016-06-30 ENCOUNTER — Encounter: Payer: Self-pay | Admitting: Family Medicine

## 2016-06-30 ENCOUNTER — Ambulatory Visit (INDEPENDENT_AMBULATORY_CARE_PROVIDER_SITE_OTHER): Payer: BC Managed Care – PPO | Admitting: Family Medicine

## 2016-06-30 VITALS — BP 106/70 | HR 71 | Temp 98.0°F | Resp 20 | Wt 154.2 lb

## 2016-06-30 DIAGNOSIS — E538 Deficiency of other specified B group vitamins: Secondary | ICD-10-CM | POA: Diagnosis not present

## 2016-06-30 DIAGNOSIS — J320 Chronic maxillary sinusitis: Secondary | ICD-10-CM | POA: Diagnosis not present

## 2016-06-30 LAB — VITAMIN B12: VITAMIN B 12: 279 pg/mL (ref 211–911)

## 2016-06-30 MED ORDER — CEFDINIR 300 MG PO CAPS: 600.0000 mg | ORAL_CAPSULE | Freq: Every day | ORAL | 0 refills | Status: DC

## 2016-06-30 NOTE — Telephone Encounter (Signed)
Please call pt:  - her vitamin B12 was low normal, but not ideal. She should be increased to every other week injections. Please make pt aware and order for in office injections q 2 weeks. Rpt b12 12 weeks.

## 2016-06-30 NOTE — Patient Instructions (Signed)
If you need help with smoking cessation please do not hesitate to call in.  I have called in cefdinir you will take 600 mg a day for 10 days.  Rest, hydrate, humidifier use.  Use Flonase daily.     Sinusitis, Adult Sinusitis is soreness and inflammation of your sinuses. Sinuses are hollow spaces in the bones around your face. They are located:  Around your eyes.  In the middle of your forehead.  Behind your nose.  In your cheekbones. Your sinuses and nasal passages are lined with a stringy fluid (mucus). Mucus normally drains out of your sinuses. When your nasal tissues get inflamed or swollen, the mucus can get trapped or blocked so air cannot flow through your sinuses. This lets bacteria, viruses, and funguses grow, and that leads to infection. Follow these instructions at home: Medicines  Take, use, or apply over-the-counter and prescription medicines only as told by your doctor. These may include nasal sprays.  If you were prescribed an antibiotic medicine, take it as told by your doctor. Do not stop taking the antibiotic even if you start to feel better. Hydrate and Humidify  Drink enough water to keep your pee (urine) clear or pale yellow.  Use a cool mist humidifier to keep the humidity level in your home above 50%.  Breathe in steam for 10-15 minutes, 3-4 times a day or as told by your doctor. You can do this in the bathroom while a hot shower is running.  Try not to spend time in cool or dry air. Rest  Rest as much as possible.  Sleep with your head raised (elevated).  Make sure to get enough sleep each night. General instructions  Put a warm, moist washcloth on your face 3-4 times a day or as told by your doctor. This will help with discomfort.  Wash your hands often with soap and water. If there is no soap and water, use hand sanitizer.  Do not smoke. Avoid being around people who are smoking (secondhand smoke).  Keep all follow-up visits as told by your  doctor. This is important. Contact a doctor if:  You have a fever.  Your symptoms get worse.  Your symptoms do not get better within 10 days. Get help right away if:  You have a very bad headache.  You cannot stop throwing up (vomiting).  You have pain or swelling around your face or eyes.  You have trouble seeing.  You feel confused.  Your neck is stiff.  You have trouble breathing. This information is not intended to replace advice given to you by your health care provider. Make sure you discuss any questions you have with your health care provider. Document Released: 12/31/2007 Document Revised: 03/09/2016 Document Reviewed: 05/09/2015 Elsevier Interactive Patient Education  2017 [employer]

## 2016-06-30 NOTE — Progress Notes (Signed)
[name] , 07-28-1971, 45 y.o., female MRN: [id number] Patient Care Team    Relationship Specialty Notifications Start End  Natalia Leatherwoodenee A Kiyla Ringler, DO PCP - General Family Medicine  05/01/15     CC: URI symptoms Subjective: Pt presents for an acute OV with complaints of headache of 1 week duration.  Associated symptoms include headache, fever, chills, productive cough, hoarseness and fatigue. Pt has tried mucinex to ease their symptoms. She is a smoker.   Pt is also do for her vit. b12 level to be rechecked. She is receiving Im injections once monthly.   Allergies  Allergen Reactions  . Augmentin [Amoxicillin-Pot Clavulanate] Diarrhea  . Doxycycline Nausea And Vomiting  . Hydrocodone Itching   Social History  Substance Use Topics  . Smoking status: Current Every Day Smoker    Packs/day: 0.50    Types: Cigarettes  . Smokeless tobacco: Never Used  . Alcohol use Yes     Comment: rarely   Past Medical History:  Diagnosis Date  . Anemia   . Anxiety    use to use / take Lorazepam ., last episode of panic  2 yrs. ago  . Arthritis    hands, knees, back - Osteo  . Complication of anesthesia    woke up before the tube was removed   . Cystitis   . Facial nerve disorder   . GERD (gastroesophageal reflux disease)   . History of chicken pox   . Pneumonia    as a child- hosp.   Marland Kitchen. Reflux   . Vaginal delivery    x2   Past Surgical History:  Procedure Laterality Date  . BREAST BIOPSY Left 2014   fatty tissue only  . CERVICAL DISCECTOMY    . CHOLECYSTECTOMY N/A 06/16/2014   Procedure: LAPAROSCOPIC CHOLECYSTECTOMY ;  Surgeon: Abigail Miyamotoouglas Blackman, MD;  [facility];  Service: General;  Laterality: N/A;  laparoscopic cholecystectomy  . LAPAROSCOPIC ABDOMINAL EXPLORATION     for endometrimosis   . NECK SURGERY  2014   Family History  Problem Relation Age of Onset  . Arthritis Mother   . Hyperlipidemia Mother   . Hypertension Mother   . Thyroid disease Mother     hypothyroid    . Arthritis Father   . Hyperlipidemia Father   . Hypertension Father   . Thyroid disease Brother   . Arthritis Maternal Grandmother   . Arthritis Maternal Grandfather   . Arthritis Paternal Grandmother   . Arthritis Paternal Grandfather      Medication List       Accurate as of 06/30/16  9:08 AM. Always use your most recent med list.          fluticasone 50 MCG/ACT nasal spray Commonly known as:  FLONASE Place 2 sprays into both nostrils daily.   vitamin B-12 1000 MCG tablet Commonly known as:  CYANOCOBALAMIN 1000 mcg daily for 7 days and then 1000 mcg weekly       No results found for this or any previous visit (from the past 24 hour(s)). No results found.   ROS: Negative, with the exception of above mentioned in HPI   Objective:  BP 106/70 (BP Location: Right Arm, Patient Position: Sitting, Cuff Size: Normal)   Pulse 71   Temp 98 F (36.7 C)   Resp 20   Wt 154 lb 4 oz (70 kg)   SpO2 97%   BMI 24.90 kg/m  Body mass index is 24.9 kg/m. Gen: Afebrile. No acute distress.  Nontoxic in appearance, well developed, well nourished.  HENT: AT. [place]. Bilateral TM visualized WNL. MMM, no oral lesions. Bilateral nares erythema, swelling and drainage. Throat without erythema or exudates. Cough, hoarseness and TTP max sinus.  Eyes:Pupils Equal Round Reactive to light, Extraocular movements intact,  Conjunctiva without redness, discharge or icterus. Neck/lymp/endocrine: Supple,ant cercical lymphadenopathy CV: RRR  Chest: CTAB, no wheeze or crackles. Good air movement, normal resp effort.  Abd: Soft. NTND. BS present. no Masses palpated.  Skin: no rashes, purpura or petechiae.  Neuro:  Normal gait. PERLA. EOMi. Alert. Oriented x3   Assessment/Plan: [name] is a 45 y.o. female present for acute OV for  Maxillary sinusitis, unspecified chronicity omnicef 600 Qd a 10 days Mucinex, rest hydrate.  Flonase.  F/u PRN Vitamin B12 deficiency - rpt vit b12 collected today  after IM supplement.  - will adjust or continue medication   electronically signed by:  Felix Pacinienee Rodel Glaspy, DO  [facility] [place]

## 2016-07-01 NOTE — Telephone Encounter (Signed)
Patient notified and verbalized understanding.  Appointments set up by Diane.

## 2016-07-03 ENCOUNTER — Ambulatory Visit (INDEPENDENT_AMBULATORY_CARE_PROVIDER_SITE_OTHER): Payer: BC Managed Care – PPO | Admitting: Family Medicine

## 2016-07-03 DIAGNOSIS — E538 Deficiency of other specified B group vitamins: Secondary | ICD-10-CM | POA: Diagnosis not present

## 2016-07-03 MED ORDER — CYANOCOBALAMIN 1000 MCG/ML IJ SOLN
1000.0000 ug | Freq: Once | INTRAMUSCULAR | Status: AC
  Administered 2016-07-03: 1000 ug via INTRAMUSCULAR

## 2016-07-03 NOTE — Progress Notes (Signed)
Electronically Signed by: Josemanuel Eakins, DO [facility] [place]  

## 2016-07-04 ENCOUNTER — Ambulatory Visit: Payer: BC Managed Care – PPO

## 2016-07-16 ENCOUNTER — Ambulatory Visit (INDEPENDENT_AMBULATORY_CARE_PROVIDER_SITE_OTHER): Payer: BC Managed Care – PPO

## 2016-07-16 DIAGNOSIS — E538 Deficiency of other specified B group vitamins: Secondary | ICD-10-CM | POA: Diagnosis not present

## 2016-07-16 MED ORDER — CYANOCOBALAMIN 1000 MCG/ML IJ SOLN
1000.0000 ug | Freq: Once | INTRAMUSCULAR | Status: AC
  Administered 2016-07-16: 1000 ug via INTRAMUSCULAR

## 2016-07-16 NOTE — Progress Notes (Signed)
Vitamin B12 given.

## 2016-07-29 ENCOUNTER — Encounter: Payer: Self-pay | Admitting: Family Medicine

## 2016-07-29 ENCOUNTER — Ambulatory Visit (INDEPENDENT_AMBULATORY_CARE_PROVIDER_SITE_OTHER): Payer: BC Managed Care – PPO | Admitting: Family Medicine

## 2016-07-29 VITALS — BP 132/76 | HR 76 | Temp 98.1°F | Resp 20 | Wt 161.5 lb

## 2016-07-29 DIAGNOSIS — Z981 Arthrodesis status: Secondary | ICD-10-CM

## 2016-07-29 DIAGNOSIS — Z9889 Other specified postprocedural states: Secondary | ICD-10-CM

## 2016-07-29 DIAGNOSIS — M542 Cervicalgia: Secondary | ICD-10-CM | POA: Diagnosis not present

## 2016-07-29 DIAGNOSIS — E538 Deficiency of other specified B group vitamins: Secondary | ICD-10-CM | POA: Diagnosis not present

## 2016-07-29 DIAGNOSIS — M5412 Radiculopathy, cervical region: Secondary | ICD-10-CM | POA: Diagnosis not present

## 2016-07-29 MED ORDER — TRAMADOL HCL 50 MG PO TABS: 50.0000 mg | ORAL_TABLET | Freq: Three times a day (TID) | ORAL | 0 refills | Status: DC | PRN

## 2016-07-29 MED ORDER — CYANOCOBALAMIN 1000 MCG/ML IJ SOLN
1000.0000 ug | Freq: Once | INTRAMUSCULAR | Status: AC
  Administered 2016-07-29: 1000 ug via INTRAMUSCULAR

## 2016-07-29 MED ORDER — METHYLPREDNISOLONE ACETATE 80 MG/ML IJ SUSP
80.0000 mg | Freq: Once | INTRAMUSCULAR | Status: AC
  Administered 2016-07-29: 80 mg via INTRAMUSCULAR

## 2016-07-29 MED ORDER — CYCLOBENZAPRINE HCL 5 MG PO TABS: 5.0000 mg | ORAL_TABLET | Freq: Three times a day (TID) | ORAL | 1 refills | Status: DC | PRN

## 2016-07-29 NOTE — Progress Notes (Signed)
Brenda Ray , 01-19-1971, 46 y.o., female MRN: [id number] Patient Care Team    Relationship Specialty Notifications Start End  Natalia Leatherwoodenee A Kosisochukwu Goldberg, DO PCP - General Family Medicine  05/01/15     CC: neck and shoulder pain  Subjective: Pt presents for an acute OV with complaints of left neck and shoulder pain for a couple months duration, which is worsening over last few weeks.   Associated symptoms include left sided neck pain. She endorses radiation to  both right and left arm to hands. She reports numbness/tingling right 5 th finger and left hand. She is having difficulty grasping/holding objects. She has a C3-C5 cervical fusion completed in 2014 by  Dr. Gerlene FeeKritzer (retired). She has had injections to neck and shoulder through Dr. Thurston HoleWainer, however > 2 years ago. She would like to return to ortho, however when trying to make the appt to them she was told she would be a new pt again and they did not have an opening for a few weeks. She still would like to see them, but her pain is too bad to wait that long to be seen.  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 She has had tried baclofen, ice and heat and it was not helpful.   03/14/2015 CT CERVICAL SPINE WITHOUT CONTRAST FINDINGS: The alignment is anatomic. The vertebral body heights are maintained. There is no acute fracture. There is no static listhesis. The prevertebral soft tissues are normal. The intraspinal soft tissues are not fully imaged on this examination due to poor soft tissue contrast, but there is no gross soft tissue abnormality.  There is anterior cervical fusion at C3-4 and C4-5 without hardware failure or complication. There is mild bilateral facet arthropathy at C2-3. There is severe left facet arthropathy at C3-4. IMPRESSION: 1. No acute osseous injury of the cervical spine. 2. Anterior cervical fusion C3-C5.  Electronically Signed   By: Elige KoHetal  Patel   On: 03/14/2015 20:08  Allergies  Allergen Reactions  .  Augmentin [Amoxicillin-Pot Clavulanate] Diarrhea  . Doxycycline Nausea And Vomiting  . Hydrocodone Itching   Social History  Substance Use Topics  . Smoking status: Current Every Day Smoker    Packs/day: 0.50    Types: Cigarettes  . Smokeless tobacco: Never Used  . Alcohol use Yes     Comment: rarely   Past Medical History:  Diagnosis Date  . Anemia   . Anxiety    use to use / take Lorazepam ., last episode of panic  2 yrs. ago  . Arthritis    hands, knees, back - Osteo  . Complication of anesthesia    woke up before the tube was removed   . Cystitis   . Facial nerve disorder   . GERD (gastroesophageal reflux disease)   . History of chicken pox   . Pneumonia    as a child- hosp.   Marland Kitchen. Reflux   . Vaginal delivery    x2   Past Surgical History:  Procedure Laterality Date  . BREAST BIOPSY Left 2014   fatty tissue only  . CERVICAL DISCECTOMY    . CHOLECYSTECTOMY N/A 06/16/2014   Procedure: LAPAROSCOPIC CHOLECYSTECTOMY ;  Surgeon: Abigail Miyamotoouglas Blackman, MD;  [facility];  Service: General;  Laterality: N/A;  laparoscopic cholecystectomy  . LAPAROSCOPIC ABDOMINAL EXPLORATION     for endometrimosis   . NECK SURGERY  2014   Family History  Problem Relation Age of Onset  . Arthritis Mother   .  Hyperlipidemia Mother   . Hypertension Mother   . Thyroid disease Mother     hypothyroid  . Arthritis Father   . Hyperlipidemia Father   . Hypertension Father   . Thyroid disease Brother   . Arthritis Maternal Grandmother   . Arthritis Maternal Grandfather   . Arthritis Paternal Grandmother   . Arthritis Paternal Grandfather    Allergies as of 07/29/2016      Reactions   Augmentin [amoxicillin-pot Clavulanate] Diarrhea   Doxycycline Nausea And Vomiting   Hydrocodone Itching      Medication List       Accurate as of 07/29/16 12:16 PM. Always use your most recent med list.          cyclobenzaprine 5 MG tablet Commonly known as:  FLEXERIL Take 1 tablet (5 mg total) by  mouth 3 (three) times daily as needed for muscle spasms.   fluticasone 50 MCG/ACT nasal spray Commonly known as:  FLONASE Place 2 sprays into both nostrils daily.   traMADol 50 MG tablet Commonly known as:  ULTRAM Take 1 tablet (50 mg total) by mouth every 8 (eight) hours as needed.   vitamin B-12 1000 MCG tablet Commonly known as:  CYANOCOBALAMIN 1000 mcg daily for 7 days and then 1000 mcg weekly       No results found for this or any previous visit (from the past 24 hour(s)). No results found.   ROS: Negative, with the exception of above mentioned in HPI   Objective:  BP 132/76 (BP Location: Left Arm, Patient Position: Sitting, Cuff Size: Normal)   Pulse 76   Temp 98.1 F (36.7 C)   Resp 20   Wt 161 lb 8 oz (73.3 kg)   SpO2 97%   BMI 26.07 kg/m  Body mass index is 26.07 kg/m. Gen: Afebrile. No acute distress. Nontoxic in appearance, well developed, well nourished.  HENT: AT. [place].MMM, no oral lesions.  Eyes:Pupils Equal Round Reactive to light, Extraocular movements intact,  Conjunctiva without redness, discharge or icterus. MSK: no bruising, erythema or skin changes. Cervical bone TTP C3-C6. Bilateral trap muscle spasm and TTP. Severe Decreased ROM in all four plans of cervical spine. Left abduction of arm and extension decreased. POS hawkins and empty can test.  MS UE 5/5 right, Left 4/5 flexion, ext, grasp. NV intact distally.  Neuro: Normal gait. PERLA. EOMi. Alert. Oriented x3  Assessment/Plan: Brenda Ray is a 46 y.o. female present for acute OV for  Neck pain, musculoskeletal History of neck surgery Hx of fusion of cervical spine Cervical radiculitis - Ambulatory referral to Orthopedic Surgery (urgently) - IM depo med - Flexeril, heat/ice therapy. 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 Will hope to get into [place] urgently since prior pt.   B12  deficiency: pt due for b12 injection, will complete today since here.   F/U 1 week if not seeing ortho yet.  electronically signed by:  Felix Pacini, DO   Primary Care - OR

## 2016-07-29 NOTE — Patient Instructions (Addendum)
It was a pleasure seeingyou today.  I have placed an urgent referrl to Mr. Wainer.  I have called in tramadol short course and flexeril.   If not seen by Thurston HoleWainer within a week, I do want to see you back.

## 2016-07-31 ENCOUNTER — Ambulatory Visit: Payer: BC Managed Care – PPO

## 2016-08-14 ENCOUNTER — Ambulatory Visit: Payer: BC Managed Care – PPO

## 2016-08-15 ENCOUNTER — Ambulatory Visit (INDEPENDENT_AMBULATORY_CARE_PROVIDER_SITE_OTHER): Payer: BC Managed Care – PPO | Admitting: Family Medicine

## 2016-08-15 DIAGNOSIS — E538 Deficiency of other specified B group vitamins: Secondary | ICD-10-CM

## 2016-08-15 IMAGING — MG MM DIAG BREAST TOMO BILATERAL
6 of 9 series · 6 of 25 positions shown · non-contrast
Comparison: 01/22/2015, 08/24/2014, 07/20/2013, 06/01/2012

CLINICAL DATA: Follow-up of left breast cyst at 2 o'clock position.
Additionally, the patient has recently palpated a mass in the upper
inner quadrant of the left breast that she has not felt before.

EXAM:
DIGITAL DIAGNOSTIC BILATERAL MAMMOGRAM WITH 3D TOMOSYNTHESIS WITH
CAD
ULTRASOUND LEFT BREAST

[L XCCL]
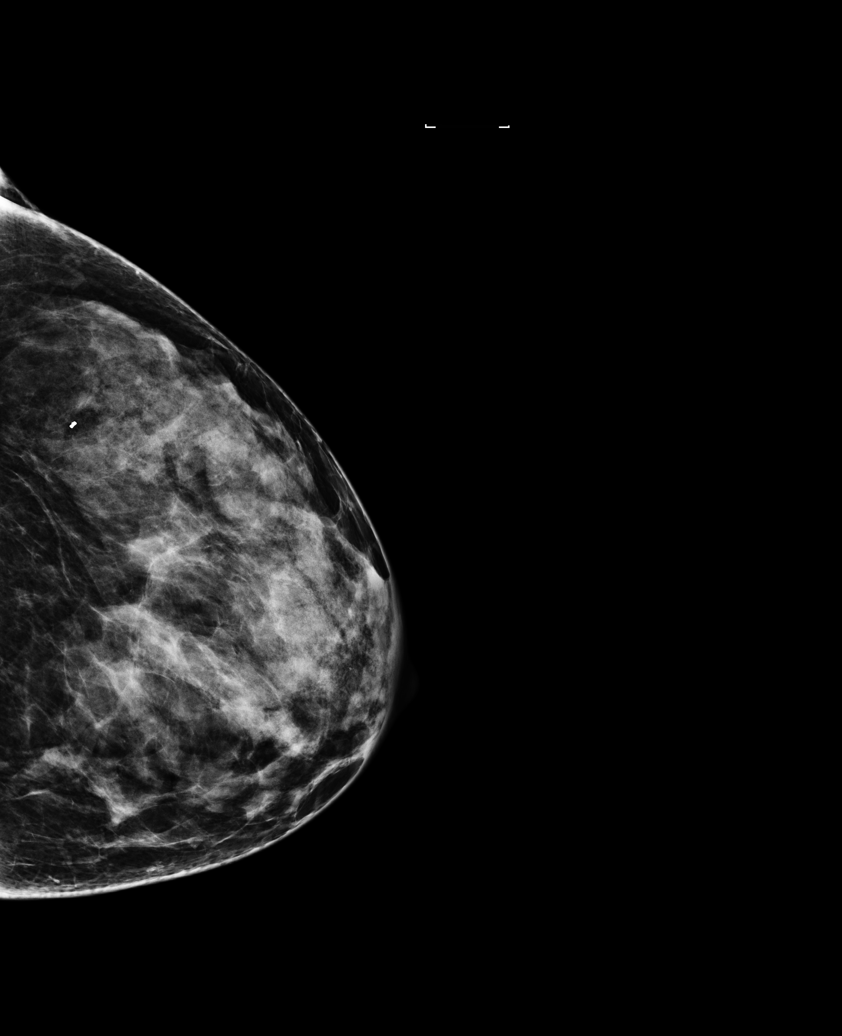

[L CC]
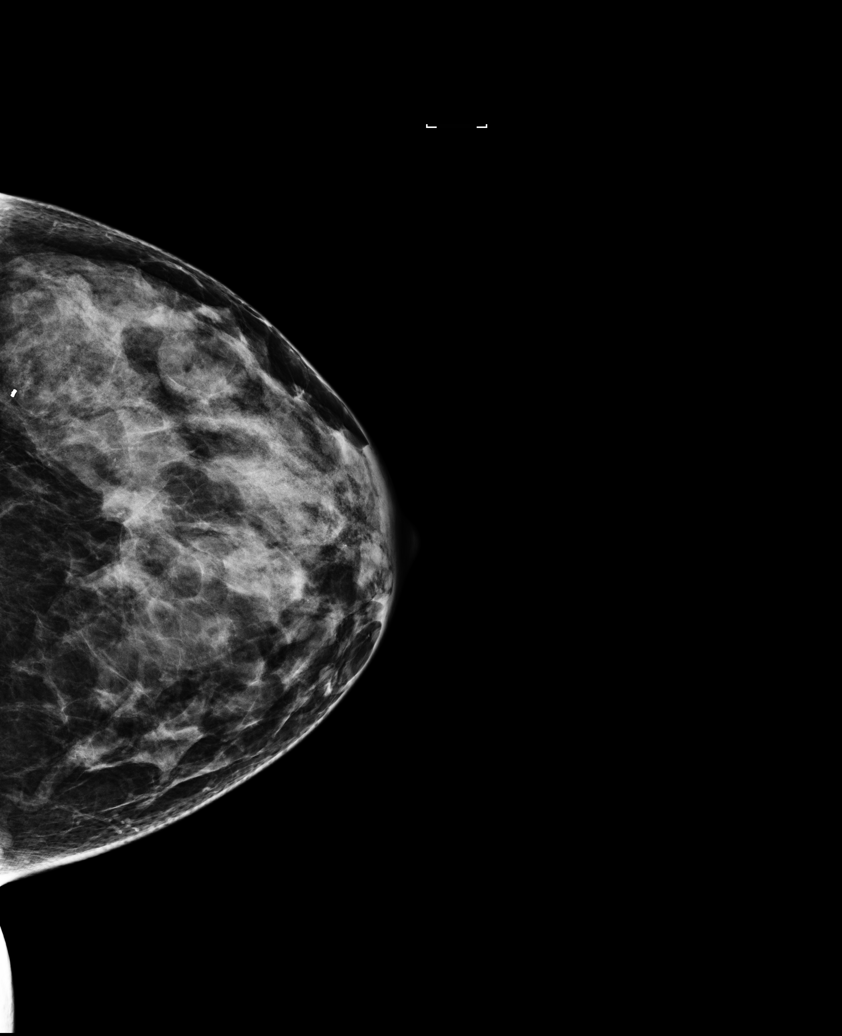

[L MLO]
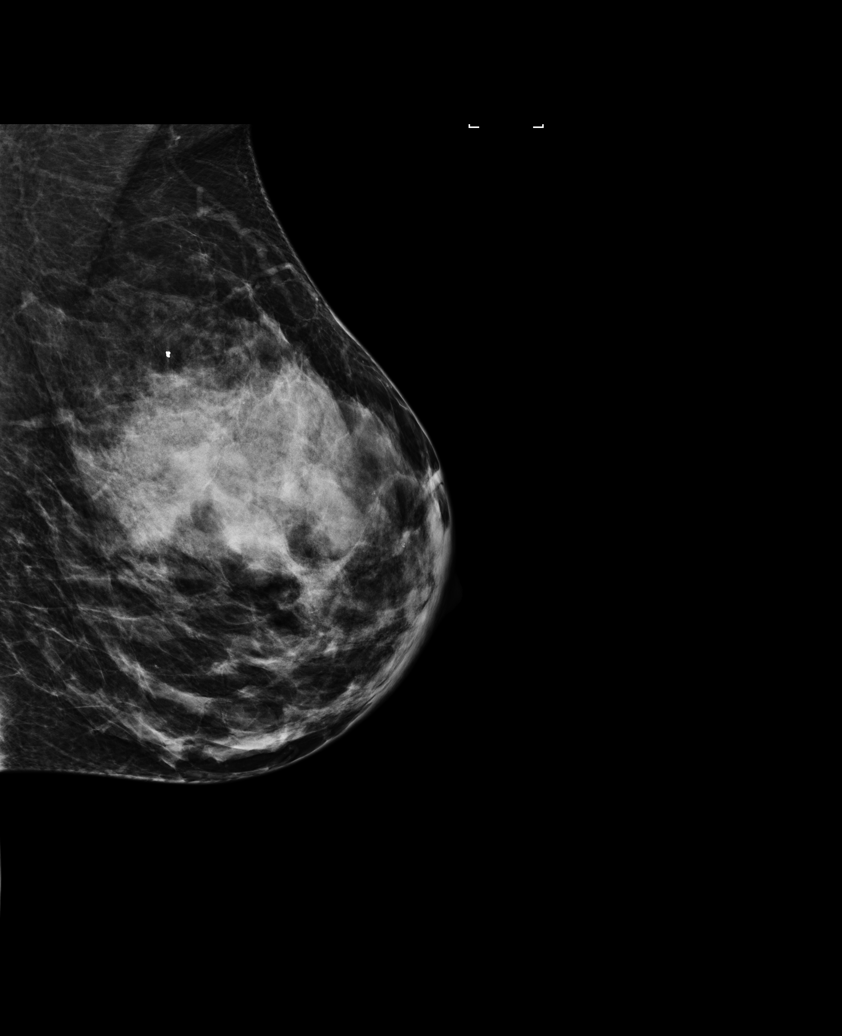

[R MLO]
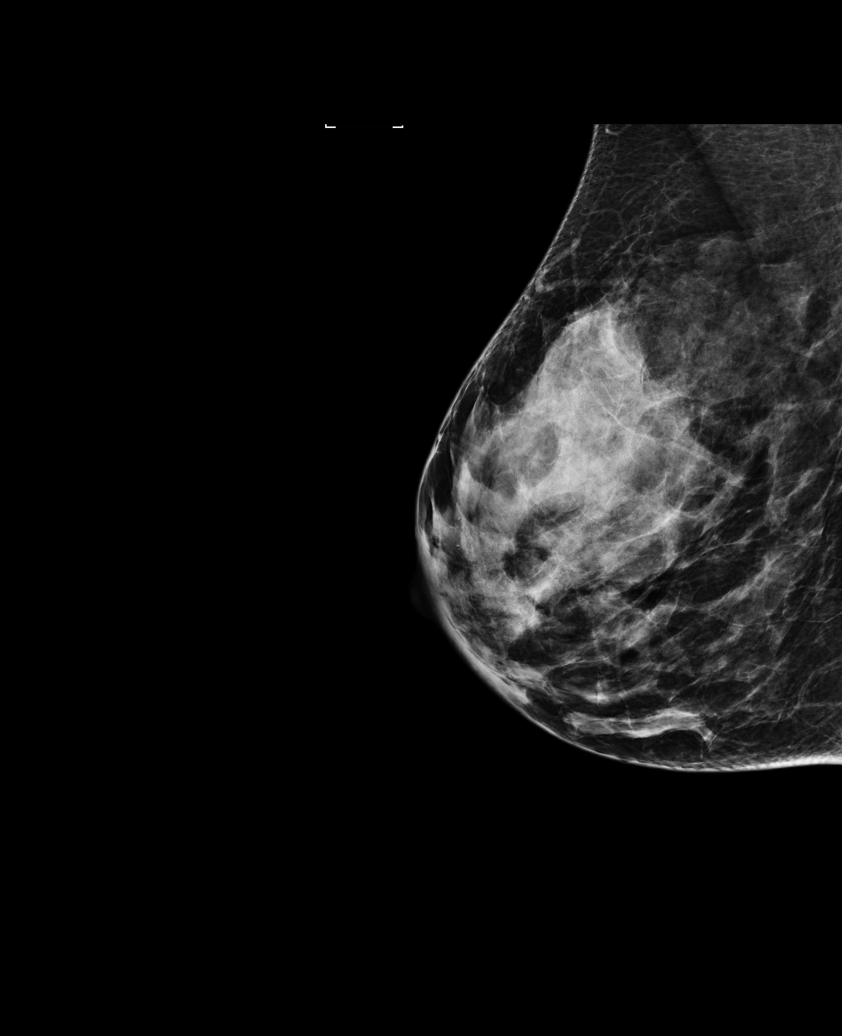

[R CC]
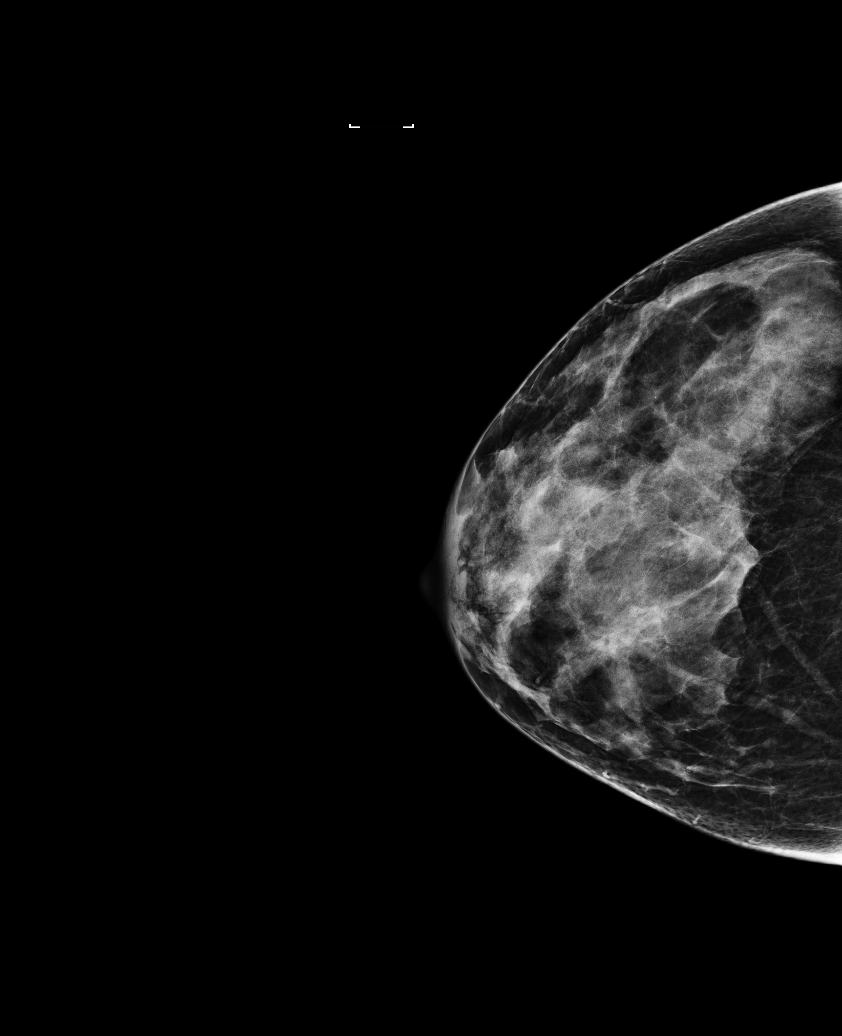

[R CC tomo · tomo slice 33/65.0]
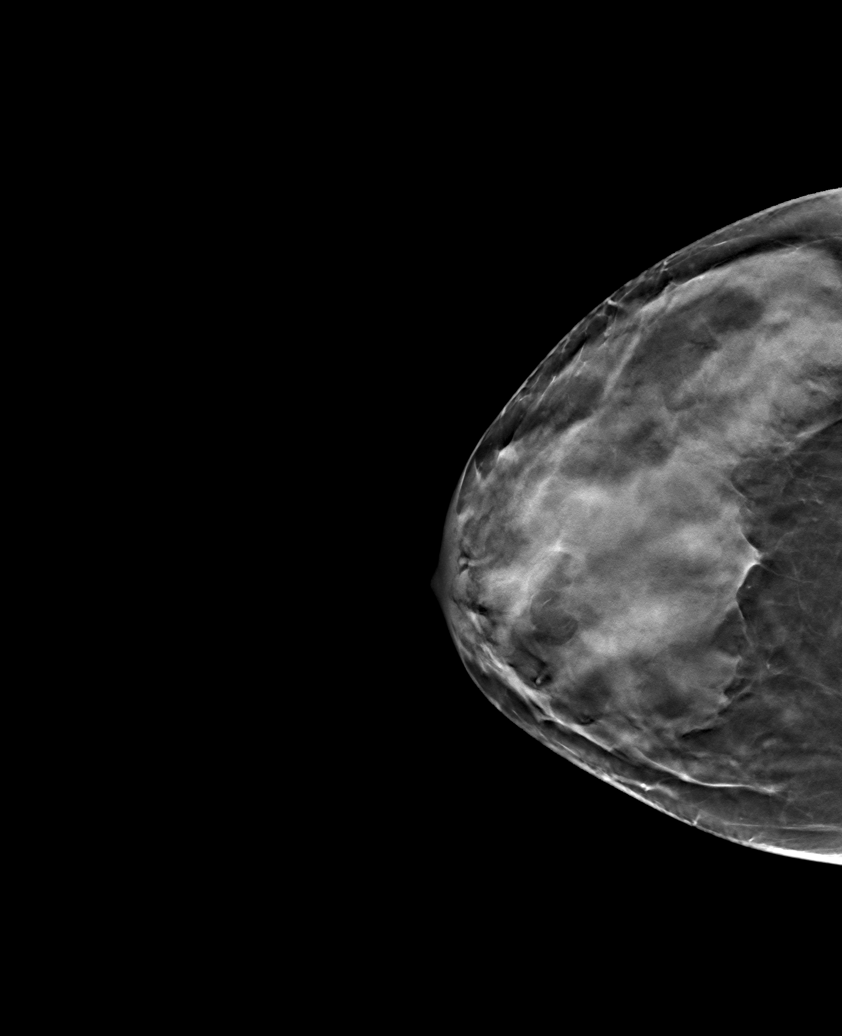

[6 of 25 positions shown; findings below may reference images not displayed]

ACR Breast Density Category c: The breast tissue is heterogeneously
dense, which may obscure small masses.
FINDINGS: There is a circumscribed mass in the outer left breast, likely
correlating with the known cyst at 2 o'clock position left breast.
No suspicious mass, architectural distortion, or suspicious
microcalcification is identified in either breast. There is a biopsy
clip in the upper outer left breast.

Mammographic images were processed with CAD.

On physical exam, the patient shows me a new area of palpable
concern in the upper inner quadrant of the left breast. I do palpate
a smooth oval mass or ridgelike area in this region.

Targeted ultrasound is performed, showing an oval simple cyst at 11
o'clock position 2 cm from the nipple measuring 2.2 x 1.3 x 2.3 cm,
accounting for the patient's area of palpable concern. Immediately
adjacent to this is a smaller simple cyst.

Ultrasound of the 2 o'clock region of the left breast 3 cm from the
nipple shows a 1.9 x 1.2 x 2.1 cm simple cyst. This corresponds to
the previously imaged cyst on ultrasounds dated December 2014 and
July 2014. No septation is seen within the cyst at this time. No
suspicious findings on ultrasound.
IMPRESSION: No evidence of malignancy in either breast. Left breast cysts as
described above. The patient's new palpable area of concern at 11
o'clock position is accounted for by a 2.3 cm simple cyst.

RECOMMENDATION:
Screening mammogram in one year.(Code:FC-[id number])

I have discussed the findings and recommendations with the patient.
Results were also provided in writing at the conclusion of the
visit. If applicable, a reminder letter will be sent to the patient
regarding the next appointment.

BI-RADS CATEGORY  2: Benign.

## 2016-08-15 MED ORDER — CYANOCOBALAMIN 1000 MCG/ML IJ SOLN
1000.0000 ug | Freq: Once | INTRAMUSCULAR | Status: AC
  Administered 2016-08-15: 1000 ug via INTRAMUSCULAR

## 2016-08-15 NOTE — Progress Notes (Signed)
Medical screening examination/treatment/procedure(s) were performed by non-physician practitioner and as supervising physician I was immediately available for consultation/collaboration.  I agree with above assessment and plan.  Electronically Signed by: Yono Ricciardelli, DO  primary Care- OR   

## 2016-08-29 ENCOUNTER — Ambulatory Visit: Payer: BC Managed Care – PPO

## 2016-09-02 ENCOUNTER — Other Ambulatory Visit: Payer: Self-pay | Admitting: Neurosurgery

## 2016-09-02 ENCOUNTER — Ambulatory Visit (INDEPENDENT_AMBULATORY_CARE_PROVIDER_SITE_OTHER): Payer: BC Managed Care – PPO

## 2016-09-02 DIAGNOSIS — E538 Deficiency of other specified B group vitamins: Secondary | ICD-10-CM

## 2016-09-02 DIAGNOSIS — M5412 Radiculopathy, cervical region: Secondary | ICD-10-CM

## 2016-09-02 MED ORDER — CYANOCOBALAMIN 1000 MCG/ML IJ SOLN
1000.0000 ug | Freq: Once | INTRAMUSCULAR | Status: AC
  Administered 2016-09-02: 1000 ug via INTRAMUSCULAR

## 2016-09-02 NOTE — Progress Notes (Signed)
Vitamin B12 injection given with no problems or incidences. Patient left with no complaints.

## 2016-09-05 ENCOUNTER — Ambulatory Visit
Admission: RE | Admit: 2016-09-05 | Discharge: 2016-09-05 | Disposition: A | Discharge status: Home / Self Care | Payer: BC Managed Care – PPO | Source: Ambulatory Visit | Attending: Neurosurgery | Admitting: Neurosurgery

## 2016-09-05 DIAGNOSIS — M5412 Radiculopathy, cervical region: Secondary | ICD-10-CM

## 2016-09-12 ENCOUNTER — Ambulatory Visit: Payer: BC Managed Care – PPO

## 2016-09-16 ENCOUNTER — Ambulatory Visit (INDEPENDENT_AMBULATORY_CARE_PROVIDER_SITE_OTHER): Payer: BC Managed Care – PPO | Admitting: Family Medicine

## 2016-09-16 DIAGNOSIS — E538 Deficiency of other specified B group vitamins: Secondary | ICD-10-CM

## 2016-09-16 MED ORDER — CYANOCOBALAMIN 1000 MCG/ML IJ SOLN
1000.0000 ug | Freq: Once | INTRAMUSCULAR | Status: AC
  Administered 2016-09-16: 1000 ug via INTRAMUSCULAR

## 2016-09-16 NOTE — Progress Notes (Signed)
Medical screening examination/treatment/procedure(s) were performed by non-physician practitioner and as supervising physician I was immediately available for consultation/collaboration.  I agree with above assessment and plan.  Electronically Signed by: Renee Kuneff, DO [place] [facility]- OR   

## 2016-09-26 ENCOUNTER — Ambulatory Visit: Payer: BC Managed Care – PPO

## 2016-10-07 ENCOUNTER — Ambulatory Visit (INDEPENDENT_AMBULATORY_CARE_PROVIDER_SITE_OTHER): Payer: BC Managed Care – PPO | Admitting: Family Medicine

## 2016-10-07 DIAGNOSIS — E538 Deficiency of other specified B group vitamins: Secondary | ICD-10-CM | POA: Diagnosis not present

## 2016-10-07 MED ORDER — CYANOCOBALAMIN 1000 MCG/ML IJ SOLN
1000.0000 ug | Freq: Once | INTRAMUSCULAR | Status: AC
  Administered 2016-10-07: 1000 ug via INTRAMUSCULAR

## 2016-10-07 NOTE — Progress Notes (Signed)
Medical screening examination/treatment/procedure(s) were performed by non-physician practitioner and as supervising physician I was immediately available for consultation/collaboration.  I agree with above assessment and plan.  Electronically Signed by: Renee Kuneff, DO [place] [facility]- OR   

## 2016-10-22 ENCOUNTER — Other Ambulatory Visit: Payer: Self-pay | Admitting: Family Medicine

## 2016-10-22 DIAGNOSIS — Z1231 Encounter for screening mammogram for malignant neoplasm of breast: Secondary | ICD-10-CM

## 2016-10-23 ENCOUNTER — Ambulatory Visit (INDEPENDENT_AMBULATORY_CARE_PROVIDER_SITE_OTHER): Payer: BC Managed Care – PPO | Admitting: Family Medicine

## 2016-10-23 DIAGNOSIS — E538 Deficiency of other specified B group vitamins: Secondary | ICD-10-CM | POA: Diagnosis not present

## 2016-10-23 MED ORDER — CYANOCOBALAMIN 1000 MCG/ML IJ SOLN
1000.0000 ug | Freq: Once | INTRAMUSCULAR | Status: AC
  Administered 2016-10-23: 1000 ug via INTRAMUSCULAR

## 2016-10-24 ENCOUNTER — Ambulatory Visit: Payer: BC Managed Care – PPO

## 2016-10-27 ENCOUNTER — Ambulatory Visit
Admission: RE | Admit: 2016-10-27 | Discharge: 2016-10-27 | Disposition: A | Discharge status: Home / Self Care | Payer: BC Managed Care – PPO | Source: Ambulatory Visit | Attending: Family Medicine | Admitting: Family Medicine

## 2016-10-27 DIAGNOSIS — Z1231 Encounter for screening mammogram for malignant neoplasm of breast: Secondary | ICD-10-CM

## 2016-10-27 NOTE — Progress Notes (Signed)
Medical screening examination/treatment/procedure(s) were performed by non-physician practitioner and as supervising physician I was immediately available for consultation/collaboration.  I agree with above assessment and plan.  Electronically Signed by: Renee Kuneff, DO [place] [facility]- OR   

## 2017-01-15 ENCOUNTER — Telehealth: Payer: Self-pay | Admitting: Family Medicine

## 2017-01-15 ENCOUNTER — Ambulatory Visit (INDEPENDENT_AMBULATORY_CARE_PROVIDER_SITE_OTHER): Payer: BLUE CROSS/BLUE SHIELD

## 2017-01-15 DIAGNOSIS — E538 Deficiency of other specified B group vitamins: Secondary | ICD-10-CM | POA: Diagnosis not present

## 2017-01-15 MED ORDER — CYANOCOBALAMIN 1000 MCG/ML IJ SOLN
1000.0000 ug | Freq: Once | INTRAMUSCULAR | Status: AC
  Administered 2017-01-15: 1000 ug via INTRAMUSCULAR

## 2017-01-15 NOTE — Telephone Encounter (Signed)
Detailed message left on voice mail for patient to call back and schedule a lab appointment for B 12 level. She was due for this in March.

## 2017-01-15 NOTE — Telephone Encounter (Signed)
Patient questioning when she needs to get B12 labs again?   Please advise.

## 2017-01-15 NOTE — Progress Notes (Addendum)
Patient presents today for Vitamin B12 injection. Injection given with no problems or incidence. Patient left with no complaints.  Medical screening examination/treatment/procedure(s) were performed by non-physician practitioner and as supervising physician I was immediately available for consultation/collaboration.  I agree with above assessment and plan.  Electronically Signed by: Felix Pacinienee Kuneff, DO [place] [facility]- OR

## 2017-01-16 NOTE — Telephone Encounter (Signed)
Spoke with Dr Claiborne BillingsKuneff we will wait to draw lab until patient has completed a 3 month period of B12 injections I spoke with the patient about this. With patient reviewed information scheduled patient for follow up appt with Dr Claiborne BillingsKuneff .

## 2017-01-27 ENCOUNTER — Ambulatory Visit (INDEPENDENT_AMBULATORY_CARE_PROVIDER_SITE_OTHER): Payer: BLUE CROSS/BLUE SHIELD

## 2017-01-27 DIAGNOSIS — E538 Deficiency of other specified B group vitamins: Secondary | ICD-10-CM | POA: Diagnosis not present

## 2017-01-27 MED ORDER — CYANOCOBALAMIN 1000 MCG/ML IJ SOLN
1000.0000 ug | Freq: Once | INTRAMUSCULAR | Status: AC
Start: 2017-01-27 — End: 2017-01-27
  Administered 2017-01-27: 1000 ug via INTRAMUSCULAR

## 2017-01-27 NOTE — Progress Notes (Addendum)
Patient presents today for Vit. B12 injection. Given with no incidence or problems. Patient left with no complaints.  Electronically Signed by: Felix Pacinienee Kuneff, DO [place] [facility]- OR

## 2017-01-29 ENCOUNTER — Ambulatory Visit: Payer: BLUE CROSS/BLUE SHIELD

## 2017-02-11 ENCOUNTER — Ambulatory Visit (INDEPENDENT_AMBULATORY_CARE_PROVIDER_SITE_OTHER): Payer: BLUE CROSS/BLUE SHIELD | Admitting: *Deleted

## 2017-02-11 DIAGNOSIS — E538 Deficiency of other specified B group vitamins: Secondary | ICD-10-CM

## 2017-02-11 MED ORDER — CYANOCOBALAMIN 1000 MCG/ML IJ SOLN
1000.0000 ug | Freq: Once | INTRAMUSCULAR | Status: AC
  Administered 2017-02-11: 1000 ug via INTRAMUSCULAR

## 2017-02-11 NOTE — Progress Notes (Signed)
Patient presents today for  B12 injection 2 of 6 as ordered by Dr Claiborne BillingsKuneff. Patient tolerated injection well. Next injection due in 2 weeks.

## 2017-02-11 NOTE — Progress Notes (Signed)
Injection 1 of 6

## 2017-02-16 NOTE — Progress Notes (Signed)
Pt with vit B12 deficiency. I agree with vit B12 injection in office today.

## 2017-02-25 ENCOUNTER — Ambulatory Visit: Payer: BLUE CROSS/BLUE SHIELD

## 2017-02-25 ENCOUNTER — Ambulatory Visit (INDEPENDENT_AMBULATORY_CARE_PROVIDER_SITE_OTHER): Payer: BLUE CROSS/BLUE SHIELD | Admitting: Family Medicine

## 2017-02-25 DIAGNOSIS — E538 Deficiency of other specified B group vitamins: Secondary | ICD-10-CM | POA: Diagnosis not present

## 2017-02-25 MED ORDER — CYANOCOBALAMIN 1000 MCG/ML IJ SOLN
1000.0000 ug | Freq: Once | INTRAMUSCULAR | Status: AC
  Administered 2017-02-25: 1000 ug via INTRAMUSCULAR

## 2017-02-25 NOTE — Progress Notes (Signed)
Patient presented to office for B12 injection.  Patient tolerated well.  

## 2017-03-06 NOTE — Progress Notes (Signed)
Pt with dx of vit B12 deficiency. Agree with 1000 mcg vit B12 IM in office today.  Signed:  Santiago BumpersPhil Tyrel Lex, MD           03/06/2017

## 2017-03-23 ENCOUNTER — Ambulatory Visit (INDEPENDENT_AMBULATORY_CARE_PROVIDER_SITE_OTHER): Payer: BLUE CROSS/BLUE SHIELD | Admitting: Family Medicine

## 2017-03-23 ENCOUNTER — Encounter: Payer: Self-pay | Admitting: Family Medicine

## 2017-03-23 VITALS — BP 111/73 | HR 73 | Temp 98.0°F | Resp 20 | Ht 66.0 in | Wt 159.5 lb

## 2017-03-23 DIAGNOSIS — E538 Deficiency of other specified B group vitamins: Secondary | ICD-10-CM

## 2017-03-23 DIAGNOSIS — E559 Vitamin D deficiency, unspecified: Secondary | ICD-10-CM | POA: Diagnosis not present

## 2017-03-23 MED ORDER — CYANOCOBALAMIN 1000 MCG/ML IJ SOLN
1000.0000 ug | Freq: Once | INTRAMUSCULAR | Status: AC
  Administered 2017-03-23: 1000 ug via INTRAMUSCULAR

## 2017-03-23 NOTE — Progress Notes (Signed)
Brenda Ray , January 10, 1971, 46 y.o., female MRN: [id number] Patient Care Team    Relationship Specialty Notifications Start End  Brenda Leatherwood, DO PCP - General Family Medicine  05/01/15     Chief Complaint  Patient presents with  . Follow-up    B12 def     Subjective: Pt presents for an OV with B12 deficiency. She has been receiving IM B12 injections every 14 days. She does feel better in the medication. Last B12 06/30/2016 279, on injections.   Depression screen [facility] 2/9 03/23/2017 10/01/2015  Decreased Interest 0 0  Down, Depressed, Hopeless 0 0  PHQ - 2 Score 0 0    Allergies  Allergen Reactions  . Augmentin [Amoxicillin-Pot Clavulanate] Diarrhea  . Doxycycline Nausea And Vomiting  . Hydrocodone Itching   Social History  Substance Use Topics  . Smoking status: Current Every Day Smoker    Packs/day: 0.50    Types: Cigarettes  . Smokeless tobacco: Never Used  . Alcohol use Yes     Comment: rarely   Past Medical History:  Diagnosis Date  . Anemia   . Anxiety    use to use / take Lorazepam ., last episode of panic  2 yrs. ago  . Arthritis    hands, knees, back - Osteo  . Complication of anesthesia    woke up before the tube was removed   . Cystitis   . Facial nerve disorder   . GERD (gastroesophageal reflux disease)   . History of chicken pox   . Pneumonia    as a child- hosp.   Marland Kitchen Reflux   . Vaginal delivery    x2   Past Surgical History:  Procedure Laterality Date  . BREAST BIOPSY Left 2014   fatty tissue only  . CERVICAL DISCECTOMY    . CHOLECYSTECTOMY N/A 06/16/2014   Procedure: LAPAROSCOPIC CHOLECYSTECTOMY ;  Surgeon: Brenda Miyamoto, MD;  Location: [facility] OR;  Service: General;  Laterality: N/A;  laparoscopic cholecystectomy  . LAPAROSCOPIC ABDOMINAL EXPLORATION     for endometrimosis   . NECK SURGERY  2014   Family History  Problem Relation Age of Onset  . Arthritis Mother   . Hyperlipidemia Mother   . Hypertension Mother   . Thyroid disease  Mother        hypothyroid  . Arthritis Father   . Hyperlipidemia Father   . Hypertension Father   . Thyroid disease Brother   . Arthritis Maternal Grandmother   . Arthritis Maternal Grandfather   . Arthritis Paternal Grandmother   . Arthritis Paternal Grandfather    Allergies as of 03/23/2017      Reactions   Augmentin [amoxicillin-pot Clavulanate] Diarrhea   Doxycycline Nausea And Vomiting   Hydrocodone Itching      Medication List       Accurate as of 03/23/17  7:03 PM. Always use your most recent med list.          cyclobenzaprine 5 MG tablet Commonly known as:  FLEXERIL Take 1 tablet (5 mg total) by mouth 3 (three) times daily as needed for muscle spasms.   traMADol 50 MG tablet Commonly known as:  ULTRAM Take 1 tablet (50 mg total) by mouth every 8 (eight) hours as needed.            Discharge Care Instructions        Start     Ordered   03/23/17 1545  CYANOCOBALAMIN 1000 MCG/ML IJ SOLN   Once  03/23/17 1535   03/23/17 0000  B12     03/23/17 1536      All past medical history, surgical history, allergies, family history, immunizations andmedications were updated in the EMR today and reviewed under the history and medication portions of their EMR.     ROS: Negative, with the exception of above mentioned in HPI   Objective:  BP 111/73 (BP Location: Right Arm, Patient Position: Sitting, Cuff Size: Normal)   Pulse 73   Temp 98 F (36.7 C)   Resp 20   Ht 5\' 6"  (1.676 m)   Wt 159 lb 8 oz (72.3 kg)   SpO2 97%   BMI 25.74 kg/m  Body mass index is 25.74 kg/m. Gen: Afebrile. No acute distress. HENT: AT. [place].  MMM, no oral lesions.  Eyes:Pupils Equal Round Reactive to light, Extraocular movements intact,  Conjunctiva without redness, discharge or icterus. CV: RRR  Chest: CTAB, no wheeze or crackles. Abd: Soft. NTND. BS present Skin: no rashes, purpura or petechiae.  Neuro: Normal gait. PERLA. EOMi. Alert. Oriented x3 .  No exam data present No  results found. No results found for this or any previous visit (from the past 24 hour(s)).  Assessment/Plan: Brenda Ray is a 46 y.o. female present for OV for  Vitamin B12 deficiency/Vitamin D - B12 - B12 injection today, continue every 14d injection for now. If needed will alter injection timing based on levels.  - If normal, followup in 6 months with rpt level. If abnormal will need to follow for rpt labs/appt in 3 months.    Reviewed expectations re: course of current medical issues.  Discussed self-management of symptoms.  Outlined signs and symptoms indicating need for more acute intervention.  Patient verbalized understanding and all questions were answered.  Patient received an After-Visit Summary.    Orders Placed This Encounter  Procedures  . B12     Note is dictated utilizing voice recognition software. Although note has been proof read prior to signing, occasional typographical errors still can be missed. If any questions arise, please do not hesitate to call for verification.   electronically signed by:  Felix Pacini, DO  [place] [facility] - [facility]

## 2017-03-23 NOTE — Patient Instructions (Signed)
Start Vit D daily [phone] u with a meal B12  Injection today B12 levels checked today and when we call you back I will let you know need for follow up on retesting...either 3 or 6 months.

## 2017-03-24 ENCOUNTER — Telehealth: Payer: Self-pay | Admitting: Family Medicine

## 2017-03-24 DIAGNOSIS — Z713 Dietary counseling and surveillance: Secondary | ICD-10-CM | POA: Diagnosis not present

## 2017-03-24 LAB — VITAMIN B12: Vitamin B-12: >2000 pg/mL — ABNORMAL HIGH (ref 200–1100)

## 2017-03-24 NOTE — Telephone Encounter (Signed)
Please call pt: Her B12 is supplemented well (really well). I recommend she start injections once monthly. Followup with provider in 6 months with rpt lab.  Approved: once monthly IM B12 injections x6.

## 2017-03-24 NOTE — Telephone Encounter (Signed)
Spoke with patient reviewed lab results and instructions. Patient verbalized understanding. 

## 2017-04-08 ENCOUNTER — Ambulatory Visit (INDEPENDENT_AMBULATORY_CARE_PROVIDER_SITE_OTHER): Payer: BLUE CROSS/BLUE SHIELD | Admitting: Family Medicine

## 2017-04-08 ENCOUNTER — Encounter: Payer: Self-pay | Admitting: Family Medicine

## 2017-04-08 VITALS — BP 112/74 | HR 80 | Temp 98.2°F | Resp 20 | Wt 157.2 lb

## 2017-04-08 DIAGNOSIS — J029 Acute pharyngitis, unspecified: Secondary | ICD-10-CM | POA: Diagnosis not present

## 2017-04-08 DIAGNOSIS — J320 Chronic maxillary sinusitis: Secondary | ICD-10-CM

## 2017-04-08 MED ORDER — METHYLPREDNISOLONE ACETATE 80 MG/ML IJ SUSP
80.0000 mg | Freq: Once | INTRAMUSCULAR | Status: AC
  Administered 2017-04-08: 80 mg via INTRAMUSCULAR

## 2017-04-08 MED ORDER — CEFDINIR 300 MG PO CAPS: 600.0000 mg | ORAL_CAPSULE | Freq: Every day | ORAL | 0 refills | Status: DC

## 2017-04-08 NOTE — Patient Instructions (Signed)
Continue mucinex. Rest, hydrate.  +/- flonase, mucinex (DM if cough), nettie pot or nasal saline.  Omnicef prescribed, take until completed.  If cough present it can last up to 6-8 weeks.  F/U 2 weeks of not improved.

## 2017-04-08 NOTE — Progress Notes (Signed)
Brenda Ray , [birth year], 46 y.o., female MRN: [id number] Patient Care Team    Relationship Specialty Notifications Start End  Natalia LeatherwoodKuneff, Renee A, DO PCP - General Family Medicine  05/01/15     Chief Complaint  Patient presents with  . URI    congestion, drainage,chest congestion started yesterday     Subjective: Pt presents for an OV with complaints of severe sore throat, hoarseness, difficulty swallowing, sinus drainage and chills of 1 day duration. She denies nausea, vomit, abd pain or diarrhea. Pt has tried afrin, mucinex, nasal saline and allergy medicine to ease their symptoms. She states she woke up this  Morning and her left eye/sinus area was swollen.   [facility] 03/23/2017 10/01/2015  Decreased Interest 0 0  Down, Depressed, Hopeless 0 0  PHQ - 2 Score 0 0    Allergies  Allergen Reactions  . Augmentin [Amoxicillin-Pot Clavulanate] Diarrhea  . Doxycycline Nausea And Vomiting  . Hydrocodone Itching   Social History  Substance Use Topics  . Smoking status: Current Every Day Smoker    Packs/day: 0.50    Types: Cigarettes  . Smokeless tobacco: Never Used  . Alcohol use Yes     Comment: rarely   Past Medical History:  Diagnosis Date  . Anemia   . Anxiety    use to use / take Lorazepam ., last episode of panic  2 yrs. ago  . Arthritis    hands, knees, back - Osteo  . Complication of anesthesia    woke up before the tube was removed   . Cystitis   . Facial nerve disorder   . GERD (gastroesophageal reflux disease)   . History of chicken pox   . Pneumonia    as a child- hosp.   Marland Kitchen. Reflux   . Vaginal delivery    x2   Past Surgical History:  Procedure Laterality Date  . BREAST BIOPSY Left 2014   fatty tissue only  . CERVICAL DISCECTOMY    . CHOLECYSTECTOMY N/A 06/16/2014   Procedure: LAPAROSCOPIC CHOLECYSTECTOMY ;  Surgeon: Abigail Miyamotoouglas Blackman, MD;  [facility];  Service: General;  Laterality: N/A;  laparoscopic cholecystectomy  .  LAPAROSCOPIC ABDOMINAL EXPLORATION     for endometrimosis   . NECK SURGERY  2014   Family History  Problem Relation Age of Onset  . Arthritis Mother   . Hyperlipidemia Mother   . Hypertension Mother   . Thyroid disease Mother        hypothyroid  . Arthritis Father   . Hyperlipidemia Father   . Hypertension Father   . Thyroid disease Brother   . Arthritis Maternal Grandmother   . Arthritis Maternal Grandfather   . Arthritis Paternal Grandmother   . Arthritis Paternal Grandfather    Allergies as of 04/08/2017      Reactions   Augmentin [amoxicillin-pot Clavulanate] Diarrhea   Doxycycline Nausea And Vomiting   Hydrocodone Itching      Medication List       Accurate as of 04/08/17  9:25 AM. Always use your most recent med list.          cyclobenzaprine 5 MG tablet Commonly known as:  FLEXERIL Take 1 tablet (5 mg total) by mouth 3 (three) times daily as needed for muscle spasms.   traMADol 50 MG tablet Commonly known as:  ULTRAM Take 1 tablet (50 mg total) by mouth every 8 (eight) hours as needed.       All past medical  history, surgical history, allergies, family history, immunizations andmedications were updated in the EMR today and reviewed under the history and medication portions of their EMR.     ROS: Negative, with the exception of above mentioned in HPI   Objective:  BP 112/74 (BP Location: Right Arm, Patient Position: Sitting, Cuff Size: Normal)   Pulse 80   Temp 98.2 F (36.8 C)   Resp 20   Wt 157 lb 4 oz (71.3 kg)   SpO2 96%   BMI 25.38 kg/m  Body mass index is 25.38 kg/m. Gen: Afebrile. No acute distress. Nontoxic in appearance, well developed, well nourished.  HENT: AT. [place]. Bilateral TM visualized with bilateral fullness, no erythema or bulging. MMM, no oral lesions. Bilateral nares with drainage and redness. Throat with erythema, no exudates. cough present. Hoarseness present, left mas sinus pressure.  Eyes:Pupils Equal Round Reactive to light,  Extraocular movements intact,  Conjunctiva without redness, discharge or icterus. Neck/lymp/endocrine: Supple,bilateral anterior lymphadenopathy CV: RRR  Chest: CTAB, no wheeze or crackles. Good air movement, normal resp effort.  Abd: Soft. NTND. BS present.  Skin: no rashes, purpura or petechiae.  Neuro: Normal gait. PERLA. EOMi. Alert. Oriented x3   No exam data present No results found. No results found for this or any previous visit (from the past 24 hour(s)).  Assessment/Plan: Brenda Ray is a 46 y.o. female present for OV for  Pharyngitis, unspecified etiology Maxillary sinusitis, unspecified chronicity IM depo medrol today.  Rest, hydrate. Work excuse provided +/- flonase, mucinex (DM if cough), nettie pot or nasal saline.  omnicef prescribed, take until completed.  If cough present it can last up to 6-8 weeks.  F/U 2 weeks of not improved.   Reviewed expectations re: course of current medical issues.  Discussed self-management of symptoms.  Outlined signs and symptoms indicating need for more acute intervention.  Patient verbalized understanding and all questions were answered.  Patient received an After-Visit Summary.    No orders of the defined types were placed in this encounter.  Note is dictated utilizing voice recognition software. Although note has been proof read prior to signing, occasional typographical errors still can be missed. If any questions arise, please do not hesitate to call for verification.   electronically signed by:  Felix Pacini, DO  [place] [facility]

## 2017-06-24 ENCOUNTER — Encounter: Payer: Self-pay | Admitting: *Deleted

## 2017-06-25 ENCOUNTER — Telehealth: Payer: Self-pay | Admitting: Family Medicine

## 2017-06-25 NOTE — Telephone Encounter (Signed)
Spoke with patient -see previous phone note.

## 2017-06-25 NOTE — Telephone Encounter (Signed)
Copied from CRM [id number]. Topic: Quick Communication - See Telephone Encounter >> Jun 25, 2017 11:46 AM Clack, Princella PellegriniJessica D wrote: CRM for notification. See Telephone encounter for:  Pt would Judeth CornfieldStephanie to give her a call, she would not tell what it was about. States Judeth CornfieldStephanie already know they have been talking about the issue. 06/25/17.

## 2017-06-25 NOTE — Telephone Encounter (Signed)
Spoke with patient explained information she states she has had changes to the area in her breast. Explained to patient she would need a provider appt for evaluation of area. Scheduled patient to be seen .

## 2017-06-25 NOTE — Telephone Encounter (Signed)
Please contact patient: - Her Echart message sounds as if she is requesting a diagnostic mammogram for changes surrounding her breast cyst. If I am understanding her correctly,  she does not want to come in for an appointment, but would like to have an order placed for a diagnostic mammogram? - I am uncertain if she is confusing a screening mammogram with a diagnostic mammogram. - Review of her last mammogram completed in April is reassuring and recommends a yearly screening mammogram (no more diagnostic 6 month interval needed). Therefore it would not be time for her to have a screening mammogram until April and typically the diagnostic center contacts them and schedules pts when due (no provider appt needed).. - If she is requesting a diagnostic mammogram secondary to changes in her breast since her last mammogram, that would require an appointment with the provider to examine in order place a diagnostic mammogram. This is not a provider preference, this is required. Please advise after speaking with the patient. Please do not Echart unless unable to get a hold of her by phone.

## 2017-06-25 NOTE — Telephone Encounter (Signed)
Noted  

## 2017-07-03 ENCOUNTER — Ambulatory Visit: Payer: BLUE CROSS/BLUE SHIELD | Admitting: Family Medicine

## 2017-07-03 ENCOUNTER — Encounter: Payer: Self-pay | Admitting: Family Medicine

## 2017-07-03 VITALS — BP 136/70 | HR 80 | Temp 97.6°F | Wt 158.0 lb

## 2017-07-03 DIAGNOSIS — N6320 Unspecified lump in the left breast, unspecified quadrant: Secondary | ICD-10-CM | POA: Diagnosis not present

## 2017-07-03 NOTE — Patient Instructions (Signed)
I have placed an order for mammogram. They will call you to schedule as soon as possible.

## 2017-07-03 NOTE — Progress Notes (Signed)
Brenda Ray , 04/02/1971, 46 y.o., female MRN: [id number] Patient Care Team    Relationship Specialty Notifications Start End  Natalia LeatherwoodKuneff, Catharina Pica A, DO PCP - General Family Medicine  05/01/15     Chief Complaint  Patient presents with  . Breast Mass    left breast mass     Subjective: Pt presents for an OV with complaints of left breast mass of 2 weeks duration.  Associated symptoms include tenderness. Pt has known dense breast and x2 left beast mass that have been followed routinely. Her current concern is the same mass at 11 o'clock position, but she feels it is changing. The mass is becoming more hard and larger. She endorses increase in burning/stinging sensation in this area. She denies skin changes or nipple discharge.    [facility] 03/23/2017 10/01/2015  Decreased Interest 0 0  Down, Depressed, Hopeless 0 0  PHQ - 2 Score 0 0    Allergies  Allergen Reactions  . Augmentin [Amoxicillin-Pot Clavulanate] Diarrhea  . Doxycycline Nausea And Vomiting  . Hydrocodone Itching   Social History   Tobacco Use  . Smoking status: Current Every Day Smoker    Packs/day: 0.50    Types: Cigarettes  . Smokeless tobacco: Never Used  Substance Use Topics  . Alcohol use: Yes    Comment: rarely   Past Medical History:  Diagnosis Date  . Anemia   . Anxiety    use to use / take Lorazepam ., last episode of panic  2 yrs. ago  . Arthritis    hands, knees, back - Osteo  . Complication of anesthesia    woke up before the tube was removed   . Cystitis   . Facial nerve disorder   . GERD (gastroesophageal reflux disease)   . History of chicken pox   . Pneumonia    as a child- hosp.   Marland Kitchen. Reflux   . Vaginal delivery    x2   Past Surgical History:  Procedure Laterality Date  . BREAST BIOPSY Left 2014   fatty tissue only  . CERVICAL DISCECTOMY    . CHOLECYSTECTOMY N/A 06/16/2014   Procedure: LAPAROSCOPIC CHOLECYSTECTOMY ;  Surgeon: Abigail Miyamotoouglas Blackman, MD;  [facility];   Service: General;  Laterality: N/A;  laparoscopic cholecystectomy  . LAPAROSCOPIC ABDOMINAL EXPLORATION     for endometrimosis   . NECK SURGERY  2014   Family History  Problem Relation Age of Onset  . Arthritis Mother   . Hyperlipidemia Mother   . Hypertension Mother   . Thyroid disease Mother        hypothyroid  . Arthritis Father   . Hyperlipidemia Father   . Hypertension Father   . Thyroid disease Brother   . Arthritis Maternal Grandmother   . Arthritis Maternal Grandfather   . Arthritis Paternal Grandmother   . Arthritis Paternal Grandfather    Allergies as of 07/03/2017      Reactions   Augmentin [amoxicillin-pot Clavulanate] Diarrhea   Doxycycline Nausea And Vomiting   Hydrocodone Itching      Medication List        Accurate as of 07/03/17  3:41 PM. Always use your most recent med list.          cefdinir 300 MG capsule Commonly known as:  OMNICEF Take 2 capsules (600 mg total) by mouth daily.       All past medical history, surgical history, allergies, family history, immunizations andmedications were updated in the  EMR today and reviewed under the history and medication portions of their EMR.     ROS: Negative, with the exception of above mentioned in HPI   Objective:  BP 136/70 (BP Location: Left Arm, Patient Position: Sitting, Cuff Size: Normal)   Pulse 80   Temp 97.6 F (36.4 C)   Wt 158 lb (71.7 kg)   LMP 06/26/2017   SpO2 95%   BMI 25.50 kg/m  Body mass index is 25.5 kg/m. Gen: Afebrile. No acute distress. Nontoxic in appearance, well developed, well nourished.  Skin: no rashes, purpura or petechiae.  Psych: nervous. Otherwise, Normal affect, dress and demeanor. Normal speech. Normal thought content and judgment. Breasts: breasts appear normal, symmetrical, mild TTP left breast left upper inner quadrant. Palpated oval dense mass 11 o'clock to 12 o'clock .Extremely dense breast bilaterally. no skin or nipple changes or axillary nodes.   No  exam data present No results found. No results found for this or any previous visit (from the past 24 hour(s)).  Assessment/Plan: Brenda Ray is a 46 y.o. female present for OV for  Left breast mass - mass at 11 o'clock position left breast in the same area as prior which has been imaged with follow ups. Last mammogram 10/27/2016 returned to yearly screens. However patient feels the mass is larger and more tender.  - MM Digital Diagnostic Bilat; Future - US BREAST LTD UNI RIGHT INC AXILLA; Future - US BREAST LTD UNI LEFT INC AXILLA; Future - F/U dependent on mammogram results.    Reviewed expectations re: course of current medical issues.  Discussed self-management of symptoms.  Outlined signs and symptoms indicating need for more acute intervention.  Patient verbalized understanding and all questions were answered.  Patient received an After-Visit Summary.    No orders of the defined types were placed in this encounter.    Note is dictated utilizing voice recognition software. Although note has been proof read prior to signing, occasional typographical errors still can be missed. If any questions arise, please do not hesitate to call for verification.   electronically signed by:  Felix Pacinienee Lillianna Sabel, DO  [facility] - [place]

## 2017-07-07 ENCOUNTER — Ambulatory Visit
Admission: RE | Admit: 2017-07-07 | Discharge: 2017-07-07 | Disposition: A | Discharge status: Home / Self Care | Payer: BLUE CROSS/BLUE SHIELD | Source: Ambulatory Visit | Attending: Family Medicine | Admitting: Family Medicine

## 2017-07-07 ENCOUNTER — Other Ambulatory Visit: Payer: Self-pay | Admitting: Family Medicine

## 2017-07-07 ENCOUNTER — Telehealth: Payer: Self-pay | Admitting: Family Medicine

## 2017-07-07 DIAGNOSIS — N6320 Unspecified lump in the left breast, unspecified quadrant: Secondary | ICD-10-CM

## 2017-07-07 DIAGNOSIS — R922 Inconclusive mammogram: Secondary | ICD-10-CM | POA: Diagnosis not present

## 2017-07-07 NOTE — Telephone Encounter (Signed)
[facility] called to see if anything was noted in chart about right breast. Reason was to see if there was any indication in chart for radiologist

## 2017-09-04 ENCOUNTER — Ambulatory Visit (INDEPENDENT_AMBULATORY_CARE_PROVIDER_SITE_OTHER): Payer: Self-pay | Admitting: Family Medicine

## 2017-09-04 ENCOUNTER — Encounter: Payer: Self-pay | Admitting: Family Medicine

## 2017-09-04 VITALS — BP 122/71 | HR 84 | Temp 97.8°F | Ht 66.0 in | Wt 156.4 lb

## 2017-09-04 DIAGNOSIS — R21 Rash and other nonspecific skin eruption: Secondary | ICD-10-CM

## 2017-09-04 MED ORDER — TRIAMCINOLONE ACETONIDE 0.5 % EX CREA: 1.0000 "application " | TOPICAL_CREAM | Freq: Three times a day (TID) | CUTANEOUS | 0 refills | Status: DC

## 2017-09-04 MED ORDER — METHYLPREDNISOLONE ACETATE 80 MG/ML IJ SUSP
80.0000 mg | Freq: Once | INTRAMUSCULAR | Status: AC
  Administered 2017-09-04: 80 mg via INTRAMUSCULAR

## 2017-09-04 NOTE — Progress Notes (Signed)
Brenda Ray , 01/21/1971, 47 y.o., female MRN: [id number] Patient Care Team    Relationship Specialty Notifications Start End  Natalia Leatherwood, DO PCP - General Family Medicine  05/01/15     Chief Complaint  Patient presents with  . Sore    pt c/o of sore on onespot  of her back and both on both ankles, she thought it might be shingles.     Subjective: Pt presents for an OV with complaints of rash of 2-3 days duration.  Associated symptoms include burning itchiness. Patient reports she first noticed the rash on her back proximally 2-3 days ago. She states she doesn't recall injuring this area, but noticed a excoriated location on her right bra line. The next day she noticed a rash bilateral medial lower extremity near her sock line. She denies any changes in personal hygiene products, new clothing or shoes. She has not tried anything over-the-counter for comfort. She was worried this possibly could be shingles.   Depression screen [facility] 2/9 09/04/2017 03/23/2017 10/01/2015  Decreased Interest 0 0 0  Down, Depressed, Hopeless 0 0 0  PHQ - 2 Score 0 0 0    Allergies  Allergen Reactions  . Augmentin [Amoxicillin-Pot Clavulanate] Diarrhea  . Doxycycline Nausea And Vomiting  . Hydrocodone Itching   Social History   Tobacco Use  . Smoking status: Current Every Day Smoker    Packs/day: 0.50    Types: Cigarettes  . Smokeless tobacco: Never Used  Substance Use Topics  . Alcohol use: Yes    Comment: rarely   Past Medical History:  Diagnosis Date  . Anemia   . Anxiety    use to use / take Lorazepam ., last episode of panic  2 yrs. ago  . Arthritis    hands, knees, back - Osteo  . Complication of anesthesia    woke up before the tube was removed   . Cystitis   . Facial nerve disorder   . GERD (gastroesophageal reflux disease)   . History of chicken pox   . Pneumonia    as a child- hosp.   Marland Kitchen Reflux   . Vaginal delivery    x2   Past Surgical History:  Procedure Laterality  Date  . BREAST BIOPSY Left 2014   fatty tissue only  . CERVICAL DISCECTOMY    . CHOLECYSTECTOMY N/A 06/16/2014   Procedure: LAPAROSCOPIC CHOLECYSTECTOMY ;  Surgeon: Abigail Miyamoto, MD;  Location: [facility] OR;  Service: General;  Laterality: N/A;  laparoscopic cholecystectomy  . LAPAROSCOPIC ABDOMINAL EXPLORATION     for endometrimosis   . NECK SURGERY  2014   Family History  Problem Relation Age of Onset  . Arthritis Mother   . Hyperlipidemia Mother   . Hypertension Mother   . Thyroid disease Mother        hypothyroid  . Arthritis Father   . Hyperlipidemia Father   . Hypertension Father   . Thyroid disease Brother   . Arthritis Maternal Grandmother   . Arthritis Maternal Grandfather   . Arthritis Paternal Grandmother   . Arthritis Paternal Grandfather   . Breast cancer Maternal Aunt    Allergies as of 09/04/2017      Reactions   Augmentin [amoxicillin-pot Clavulanate] Diarrhea   Doxycycline Nausea And Vomiting   Hydrocodone Itching      Medication List    as of 09/04/2017  4:15 PM   You have not been prescribed any medications.     All past  medical history, surgical history, allergies, family history, immunizations andmedications were updated in the EMR today and reviewed under the history and medication portions of their EMR.     ROS: Negative, with the exception of above mentioned in HPI   Objective:  BP 122/71 (BP Location: Left Arm, Patient Position: Sitting, Cuff Size: Normal)   Pulse 84   Temp 97.8 F (36.6 C) (Oral)   Ht 5\' 6"  (1.676 m)   Wt 156 lb 6.4 oz (70.9 kg)   LMP 08/04/2017   SpO2 97%   BMI 25.24 kg/m  Body mass index is 25.24 kg/m. Gen: Afebrile. No acute distress. Nontoxic in appearance, well developed, well nourished.  HENT: AT. [place].  MMM Eyes:Pupils Equal Round Reactive to light, Extraocular movements intact,  Conjunctiva without redness, discharge or icterus. Skin: Right midthoracic/problem line with 2 small areas of excoriation, mild  erythema. No vesicles or drainage. Nontender. Bilateral lower extremities and sock line, just superior to ankle with flat areas of mixed blanchable and non-blanchable erythema and petechiae. Nontender. Neurovascular intact distally.   No exam data present No results found. No results found for this or any previous visit (from the past 24 hour(s)).  Assessment/Plan: Brenda Ray is a 47 y.o. female present for OV for  Rash - Reassured patient this is not consistent with shingles. Uncertain if areas are even of the same etiology. Could consider vasculitis as potential cause keeping the characteristic of the lower extremity rash versus dermatitis. - Patient declined oral steroid today secondary to side effects. She is agreeable to injectable steroid. Therefore will provide injection of methylprednisolone and topical steroid/Kenalog. - methylPREDNISolone acetate (DEPO-MEDROL) injection 80 mg - Follow-up in 2 weeks if not resolved, sooner if worsening.   Reviewed expectations re: course of current medical issues.  Discussed self-management of symptoms.  Outlined signs and symptoms indicating need for more acute intervention.  Patient verbalized understanding and all questions were answered.  Patient received an After-Visit Summary.    No orders of the defined types were placed in this encounter.    Note is dictated utilizing voice recognition software. Although note has been proof read prior to signing, occasional typographical errors still can be missed. If any questions arise, please do not hesitate to call for verification.   electronically signed by:  Felix Pacinienee Jailen Coward, DO  [facility] - [place]

## 2017-09-04 NOTE — Patient Instructions (Signed)
Steroid shot today.  Steroid cream prescribed.   If not resolved in 2 weeks, or if worsening, please be seen to further investigate.

## 2017-09-26 ENCOUNTER — Other Ambulatory Visit: Payer: Self-pay

## 2017-09-26 ENCOUNTER — Encounter (HOSPITAL_BASED_OUTPATIENT_CLINIC_OR_DEPARTMENT_OTHER): Payer: Self-pay | Admitting: Emergency Medicine

## 2017-09-26 ENCOUNTER — Emergency Department (HOSPITAL_BASED_OUTPATIENT_CLINIC_OR_DEPARTMENT_OTHER)
Admission: EM | Admit: 2017-09-26 | Discharge: 2017-09-26 | Disposition: A | Discharge status: Home / Self Care | Payer: Self-pay | Attending: Emergency Medicine | Admitting: Emergency Medicine

## 2017-09-26 DIAGNOSIS — R519 Headache, unspecified: Secondary | ICD-10-CM

## 2017-09-26 DIAGNOSIS — R51 Headache: Secondary | ICD-10-CM

## 2017-09-26 DIAGNOSIS — J069 Acute upper respiratory infection, unspecified: Secondary | ICD-10-CM | POA: Insufficient documentation

## 2017-09-26 DIAGNOSIS — F17210 Nicotine dependence, cigarettes, uncomplicated: Secondary | ICD-10-CM | POA: Insufficient documentation

## 2017-09-26 NOTE — ED Provider Notes (Signed)
[facility] EMERGENCY DEPARTMENT Provider Note   CSN: [id number] Arrival date & time: 09/26/17  0950     History   Chief Complaint Chief Complaint  Patient presents with  . Headache    HPI  Brenda Ray is a 47 y.o. Female with a history of GERD, anemia, anxiety, who presents to the ED for evaluation of headache, chills, body aches and congestion.  Patient reports she has had worsening nasal congestion over the past week and Thursday while at work she started having a severe headache headache started over her forehead and wrapped around her head like a tight headband, came on gradually but got so bad that she went home from work.  She reports she is been using Advil which has helped with her headache, he now describes it as an intermittent dull ache, 4/10.  Patient reports some chills and low-grade fever, T-max at home was 100.0, she denies any neck pain or neck stiffness.  No associated vision changes, dizziness, numbness, tingling or weakness in her arms or legs.  Patient reports she is continued to have nasal congestion and sinus pressure, and had a sore throat when she woke up today. Also reports some right ear pain, reports it feels like pressure in the ear, occasional dry cough. Patient also reports a few episodes of diarrhea and some intermittent nausea, no episodes of vomiting or abdominal pain.  No chest pain or shortness of breath.  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       Past Medical History:  Diagnosis Date  . Anemia   . Anxiety    use to use / take Lorazepam ., last episode of panic  2 yrs. ago  . Arthritis    hands, knees, back - Osteo  . Complication of anesthesia    woke up before the tube was removed   . Cystitis   . Facial nerve disorder   . GERD (gastroesophageal reflux disease)   . History of chicken pox   . Pneumonia    as a child- hosp.   Marland Kitchen. Reflux   . Vaginal delivery    x2    Patient Active Problem List   Diagnosis Date Noted  . Stress incontinence 10/01/2015  . Encounter for preventive health examination 10/01/2015  . Left breast mass 10/01/2015  . Vitamin D deficiency 10/01/2015  . Vitamin B12 deficiency 10/01/2015  . Tobacco abuse 05/01/2015    Past Surgical History:  Procedure Laterality Date  . BREAST BIOPSY Left 2014   fatty tissue only  . CERVICAL DISCECTOMY    . CHOLECYSTECTOMY N/A 06/16/2014   Procedure: LAPAROSCOPIC CHOLECYSTECTOMY ;  Surgeon: Abigail Miyamotoouglas Blackman, MD;  [facility];  Service: General;  Laterality: N/A;  laparoscopic cholecystectomy  . LAPAROSCOPIC ABDOMINAL EXPLORATION     for endometrimosis   . NECK SURGERY  2014    OB History    No data available       Home Medications    Prior to Admission medications   Medication Sig Start Date End Date Taking? Authorizing Provider  triamcinolone cream (KENALOG) 0.5 % Apply 1 application topically 3 (three) times daily. 09/04/17   Natalia LeatherwoodKuneff, Renee A, DO    Family History Family History  Problem Relation Age of Onset  . Arthritis Mother   . Hyperlipidemia Mother   . Hypertension  Mother   . Thyroid disease Mother        hypothyroid  . Arthritis Father   . Hyperlipidemia Father   . Hypertension Father   . Thyroid disease Brother   . Arthritis Maternal Grandmother   . Arthritis Maternal Grandfather   . Arthritis Paternal Grandmother   . Arthritis Paternal Grandfather   . Breast cancer Maternal Aunt     Social History Social History   Tobacco Use  . Smoking status: Current Every Day Smoker    Packs/day: 0.50    Types: Cigarettes  . Smokeless tobacco: Never Used  Substance Use Topics  . Alcohol use: Yes    Comment: rarely  . Drug use: No     Allergies   Augmentin [amoxicillin-pot clavulanate]; Doxycycline; and Hydrocodone   Review of Systems Review of Systems  Constitutional: Positive for chills,  fatigue and fever.  HENT: Positive for congestion, ear pain, sinus pressure, sinus pain and sore throat. Negative for ear discharge, postnasal drip and rhinorrhea.   Eyes: Negative for discharge, redness, itching and visual disturbance.  Respiratory: Positive for cough. Negative for chest tightness, shortness of breath and wheezing.   Cardiovascular: Negative for chest pain and leg swelling.  Gastrointestinal: Positive for diarrhea and nausea. Negative for abdominal pain, blood in stool and vomiting.  Genitourinary: Negative for dysuria and frequency.  Musculoskeletal: Positive for myalgias. Negative for arthralgias, back pain, neck pain and neck stiffness.  Skin: Negative for color change and rash.  Neurological: Positive for headaches. Negative for dizziness, syncope, facial asymmetry, weakness, light-headedness and numbness.     Physical Exam Updated Vital Signs BP 125/80 (BP Location: Left Arm)   Pulse 88   Temp 98.1 F (36.7 C) (Oral)   Resp 18   Ht 5\' 6"  (1.676 m)   Wt 67.8 kg (149 lb 7.6 oz)   LMP 09/04/2017   SpO2 100%   BMI 24.13 kg/m   Physical Exam  Constitutional: She appears well-developed and well-nourished. No distress.  Pt is well-appearing and in no acute distress  HENT:  Head: Normocephalic and atraumatic.  Bilateral TMs clear with good landmarks, EACs unremarkable, severe nasal mucosa edema with small amount of clear rhinorrhea, posterior oropharynx clear and moist, with some erythema, no edema or exudates  Eyes: EOM are normal. Pupils are equal, round, and reactive to light. Right eye exhibits no discharge. Left eye exhibits no discharge.  No nystagmus, no proptosis  Neck: Normal range of motion. Neck supple. No neck rigidity. No Brudzinski's sign and no Kernig's sign noted.  Cardiovascular: Normal rate, regular rhythm, normal heart sounds and intact distal pulses.  Pulmonary/Chest: Effort normal and breath sounds normal. No stridor. No respiratory distress.  She has no wheezes. She has no rales.  Respirations equal and unlabored, patient able to speak in full sentences, lungs clear to auscultation bilaterally  Abdominal: Soft. Bowel sounds are normal. She exhibits no distension and no mass. There is no tenderness. There is no guarding.  Lymphadenopathy:    She has no cervical adenopathy.  Neurological: She is alert. Coordination normal.  Speech is clear, able to follow commands CN III-XII intact Normal strength in upper and lower extremities bilaterally including dorsiflexion and plantar flexion, strong and equal grip strength Sensation normal to light and sharp touch Moves extremities without ataxia, coordination intact Normal finger to nose and rapid alternating movements  Skin: Skin is warm and dry. Capillary refill takes less than 2 seconds. She is not diaphoretic.  Psychiatric: She has a  normal mood and affect. Her behavior is normal.  Nursing note and vitals reviewed.    ED Treatments / Results  Labs (all labs ordered are listed, but only abnormal results are displayed) Labs Reviewed - No data to display  EKG  EKG Interpretation None       Radiology No results found.  Procedures Procedures (including critical care time)  Medications Ordered in ED Medications - No data to display   Initial Impression / Assessment and Plan / ED Course  I have reviewed the triage vital signs and the nursing notes.  Pertinent labs & imaging results that were available during my care of the patient were reviewed by me and considered in my medical decision making (see chart for details).  Pt presents with nasal congestion and headache, sore throat started today, occasional dry cough. Pt is well appearing and vitals are normal. Lungs CTA on exam.  Patient reports associated headache, has had some low-grade fevers and chills, no neck pain or stiffness reported, no rigidity on exam to suggest meningitis.  Headache was not maximal intensity on  exam not the most severe headache of the patient's life no other red flag symptoms.  Patients symptoms are consistent with URI, likely viral etiology. Discussed that antibiotics are not indicated for viral infections. Pt will be discharged with symptomatic treatment, encouraged NSAIDs, Zyrtec and Flonase as well as saline sinus rinses.  Return precautions discussed, patient to follow-up with her primary care provider.  Verbalizes understanding and is agreeable with plan. Pt is hemodynamically stable & in NAD prior to dc.   Final Clinical Impressions(s) / ED Diagnoses   Final diagnoses:  Upper respiratory tract infection, unspecified type  Bad headache    ED Discharge Orders    None       Dartha Lodge, [place] 09/26/17 1046    Nira Conn, MD 09/27/17 [phone]

## 2017-09-26 NOTE — ED Triage Notes (Signed)
Patient states that she started to have a Headache on Thursday and since she has had chills nad aches, congestion in her chest  - Nausea and Diarrhea

## 2017-09-26 NOTE — Discharge Instructions (Signed)
Your symptoms are likely caused by a viral upper respiratory infection. Antibiotics are not helpful in treating viral infection, the virus should run its course in about 5-7 days. Please make sure you are drinking plenty of fluids. You can treat your symptoms supportively with tylenol/ibuprofen for fevers and pains, Zyrtec and Flonase to heal with nasal congestion, and over the counter cough syrups and throat lozenges to help with cough. If your symptoms are not improving please follow up with you Primary doctor.  ° °If you develop persistent fevers, shortness of breath or difficulty breathing, chest pain, severe headache and neck pain, persistent nausea and vomiting or other new or concerning symptoms return to the Emergency department. ° °

## 2017-09-30 ENCOUNTER — Encounter: Payer: Self-pay | Admitting: Family Medicine

## 2017-09-30 ENCOUNTER — Ambulatory Visit (INDEPENDENT_AMBULATORY_CARE_PROVIDER_SITE_OTHER): Payer: Self-pay | Admitting: Family Medicine

## 2017-09-30 VITALS — BP 117/76 | HR 84 | Temp 98.1°F | Resp 18 | Ht 66.0 in | Wt 149.0 lb

## 2017-09-30 DIAGNOSIS — G44209 Tension-type headache, unspecified, not intractable: Secondary | ICD-10-CM

## 2017-09-30 DIAGNOSIS — J320 Chronic maxillary sinusitis: Secondary | ICD-10-CM

## 2017-09-30 MED ORDER — HYDROXYZINE PAMOATE 25 MG PO CAPS: 25.0000 mg | ORAL_CAPSULE | Freq: Every day | ORAL | 5 refills | Status: DC

## 2017-09-30 MED ORDER — METHYLPREDNISOLONE ACETATE 80 MG/ML IJ SUSP
80.0000 mg | Freq: Once | INTRAMUSCULAR | Status: AC
  Administered 2017-09-30: 80 mg via INTRAMUSCULAR

## 2017-09-30 MED ORDER — CEFDINIR 300 MG PO CAPS: 600.0000 mg | ORAL_CAPSULE | Freq: Every day | ORAL | 0 refills | Status: DC

## 2017-09-30 NOTE — Patient Instructions (Signed)
Rest, hydrate.  + flonase, mucinex (DM if cough), nettie pot or nasal saline.  omnicef prescribed, take until completed.  Vistaril 1 tab before bed.  If cough present it can last up to 6-8 weeks.  F/U 2 weeks of not improved.     Sinusitis, Adult Sinusitis is soreness and inflammation of your sinuses. Sinuses are hollow spaces in the bones around your face. They are located:  Around your eyes.  In the middle of your forehead.  Behind your nose.  In your cheekbones.  Your sinuses and nasal passages are lined with a stringy fluid (mucus). Mucus normally drains out of your sinuses. When your nasal tissues get inflamed or swollen, the mucus can get trapped or blocked so air cannot flow through your sinuses. This lets bacteria, viruses, and funguses grow, and that leads to infection. Follow these instructions at home: Medicines  Take, use, or apply over-the-counter and prescription medicines only as told by your doctor. These may include nasal sprays.  If you were prescribed an antibiotic medicine, take it as told by your doctor. Do not stop taking the antibiotic even if you start to feel better. Hydrate and Humidify  Drink enough water to keep your pee (urine) clear or pale yellow.  Use a cool mist humidifier to keep the humidity level in your home above 50%.  Breathe in steam for 10-15 minutes, 3-4 times a day or as told by your doctor. You can do this in the bathroom while a hot shower is running.  Try not to spend time in cool or dry air. Rest  Rest as much as possible.  Sleep with your head raised (elevated).  Make sure to get enough sleep each night. General instructions  Put a warm, moist washcloth on your face 3-4 times a day or as told by your doctor. This will help with discomfort.  Wash your hands often with soap and water. If there is no soap and water, use hand sanitizer.  Do not smoke. Avoid being around people who are smoking (secondhand smoke).  Keep all  follow-up visits as told by your doctor. This is important. Contact a doctor if:  You have a fever.  Your symptoms get worse.  Your symptoms do not get better within 10 days. Get help right away if:  You have a very bad headache.  You cannot stop throwing up (vomiting).  You have pain or swelling around your face or eyes.  You have trouble seeing.  You feel confused.  Your neck is stiff.  You have trouble breathing. This information is not intended to replace advice given to you by your health care provider. Make sure you discuss any questions you have with your health care provider. Document Released: 12/31/2007 Document Revised: 03/09/2016 Document Reviewed: 05/09/2015 Elsevier Interactive Patient Education  [employer]2018 Elsevier Inc.

## 2017-09-30 NOTE — Progress Notes (Signed)
Brenda Ray , [birth year], 47 y.o., female MRN: [id number] Patient Care Team    Relationship Specialty Notifications Start End  Natalia LeatherwoodKuneff, Andrell Tallman A, DO PCP - General Family Medicine  05/01/15     Chief Complaint  Patient presents with  . Headache    seen in ER     Subjective: Pt presents for an OV with complaints of headache of 1 week duration.  Associated symptoms include nasal congestion , band-like headache, sore throat, nausea, diarrhea, chill and fever. She was seen in the ED 09/26/2017 for this condition. She reports the headache is not constant but has returned. She endorses continued sinus pressure and new onset dizziness. Her BP as ranged highest 140/80 when she takes it during headache.  Pt has tried floanse to ease their symptoms.   [facility] screen [facility] 2/9 09/04/2017 03/23/2017 10/01/2015  Decreased Interest 0 0 0  Down, Depressed, Hopeless 0 0 0  PHQ - 2 Score 0 0 0    Allergies  Allergen Reactions  . Augmentin [Amoxicillin-Pot Clavulanate] Diarrhea  . Doxycycline Nausea And Vomiting  . Hydrocodone Itching   Social History   Tobacco Use  . Smoking status: Former Smoker    Packs/day: 0.50    Years: 36.00    Pack years: 18.00    Types: Cigarettes, E-cigarettes    Last attempt to quit: 02/25/2017    Years since quitting: 0.5  . Smokeless tobacco: Never Used  Substance Use Topics  . Alcohol use: Yes    Comment: rarely   Past Medical History:  Diagnosis Date  . Anemia   . Anxiety    use to use / take Lorazepam ., last episode of panic  2 yrs. ago  . Arthritis    hands, knees, back - Osteo  . Complication of anesthesia    woke up before the tube was removed   . Cystitis   . Facial nerve disorder   . GERD (gastroesophageal reflux disease)   . History of chicken pox   . Pneumonia    as a child- hosp.   [facility] [facility]. Reflux   . Vaginal delivery    x2   Past Surgical History:  Procedure Laterality Date  . BREAST BIOPSY Left 2014   fatty tissue only  . CERVICAL  DISCECTOMY    . CHOLECYSTECTOMY N/A 06/16/2014   Procedure: LAPAROSCOPIC CHOLECYSTECTOMY ;  Surgeon: Abigail Miyamotoouglas Blackman, MD;  [facility];  Service: General;  Laterality: N/A;  laparoscopic cholecystectomy  . LAPAROSCOPIC ABDOMINAL EXPLORATION     for endometrimosis   . NECK SURGERY  2014   Family History  Problem Relation Age of Onset  . Arthritis Mother   . Hyperlipidemia Mother   . Hypertension Mother   . Thyroid disease Mother        hypothyroid  . Arthritis Father   . Hyperlipidemia Father   . Hypertension Father   . Thyroid disease Brother   . Arthritis Maternal Grandmother   . Arthritis Maternal Grandfather   . Arthritis Paternal Grandmother   . Arthritis Paternal Grandfather   . Breast cancer Maternal Aunt    Allergies as of 09/30/2017      Reactions   Augmentin [amoxicillin-pot Clavulanate] Diarrhea   Doxycycline Nausea And Vomiting   Hydrocodone Itching      Medication List        Accurate as of 09/30/17  9:17 AM. Always use your most recent med list.          triamcinolone cream  0.5 % Commonly known as:  KENALOG Apply 1 application topically 3 (three) times daily.       All past medical history, surgical history, allergies, family history, immunizations andmedications were updated in the EMR today and reviewed under the history and medication portions of their EMR.     ROS: Negative, with the exception of above mentioned in HPI   Objective:  BP 117/76 (BP Location: Left Arm, Patient Position: Sitting, Cuff Size: Normal)   Pulse 84   Temp 98.1 F (36.7 C)   Resp 18   Ht 5\' 6"  (1.676 m)   Wt 149 lb (67.6 kg)   LMP 09/04/2017   SpO2 97%   BMI 24.05 kg/m  Body mass index is 24.05 kg/m. Gen: Afebrile. No acute distress. Nontoxic in appearance, well developed, well nourished.  HENT: AT. [place]. Bilateral TM visualized wnl. MMM, no oral lesions. Bilateral nares with severe erythema, swelling and drainage. Throat without erythema or exudates. No PND. No  cough or hoarseness. TTP frontal and max sinus.  Eyes:Pupils Equal Round Reactive to light, Extraocular movements intact,  Conjunctiva without redness, discharge or icterus. Neck/lymp/endocrine: Supple,right ant cervical  Lymphadenopathy, NROM.  CV: RRR  Chest: CTAB, no wheeze or crackles. Good air movement, normal resp effort.   Neuro: Normal gait. PERLA. EOMi. Alert. Oriented x3   No exam data present No results found. No results found for this or any previous visit (from the past 24 hour(s)).  Assessment/Plan: Brenda Ray is a 47 y.o. female present for OV for  Maxillary sinusitis, unspecified chronicity with recurrent tension headache.  - methylPREDNISolone acetate (DEPO-MEDROL) injection 80 mg Rest, hydrate.  + flonase, mucinex (DM if cough), nettie pot or nasal saline.  omnicef prescribed, take until completed.  Heat therapy to neck. Appears tension related. Trial of vistaril 25 mg QHS.  If cough present it can last up to 6-8 weeks.  F/U 2 weeks of not improved.     Reviewed expectations re: course of current medical issues.  Discussed self-management of symptoms.  Outlined signs and symptoms indicating need for more acute intervention.  Patient verbalized understanding and all questions were answered.  Patient received an After-Visit Summary.    No orders of the defined types were placed in this encounter.    Note is dictated utilizing voice recognition software. Although note has been proof read prior to signing, occasional typographical errors still can be missed. If any questions arise, please do not hesitate to call for verification.   electronically signed by:  Felix Pacini, DO  [facility] - [place]

## 2017-10-01 ENCOUNTER — Telehealth: Payer: Self-pay | Admitting: *Deleted

## 2017-10-02 ENCOUNTER — Encounter: Payer: Self-pay | Admitting: Family Medicine

## 2017-10-02 ENCOUNTER — Ambulatory Visit (INDEPENDENT_AMBULATORY_CARE_PROVIDER_SITE_OTHER): Payer: Self-pay | Admitting: Family Medicine

## 2017-10-02 VITALS — BP 128/77 | HR 87 | Temp 97.9°F | Resp 20 | Ht 66.0 in | Wt 148.2 lb

## 2017-10-02 DIAGNOSIS — F419 Anxiety disorder, unspecified: Secondary | ICD-10-CM | POA: Insufficient documentation

## 2017-10-02 DIAGNOSIS — G44209 Tension-type headache, unspecified, not intractable: Secondary | ICD-10-CM

## 2017-10-02 DIAGNOSIS — N951 Menopausal and female climacteric states: Secondary | ICD-10-CM | POA: Insufficient documentation

## 2017-10-02 MED ORDER — CITALOPRAM HYDROBROMIDE 20 MG PO TABS: 20.0000 mg | ORAL_TABLET | Freq: Every day | ORAL | 0 refills | Status: DC

## 2017-10-02 MED ORDER — CYCLOBENZAPRINE HCL 5 MG PO TABS: 5.0000 mg | ORAL_TABLET | Freq: Two times a day (BID) | ORAL | 1 refills | Status: DC | PRN

## 2017-10-02 NOTE — Telephone Encounter (Signed)
Note for work printed and placed at front desk

## 2017-10-02 NOTE — Progress Notes (Addendum)
Brenda Ray , July 03, 1971, 47 y.o., female MRN: [id number] Patient Care Team    Relationship Specialty Notifications Start End  Natalia Leatherwood, DO PCP - General Family Medicine  05/01/15     Chief Complaint  Patient presents with  . Anxiety     Subjective:   Anxiety/hot flashes/tension headache:  Patient presents today to discuss her anxiety. She reports she is feeling more overwhelmed. She has always been a persistent takes care of her family members around her. Her son recently moved to [place] and a loved one that was needing her to care for them has passed on. She has found since these changes her anxiety has increased. She abruptly quit her job. She reports there was nothing wrong with the job itself and she enjoyed her job. She will however did not want to go in to work anymore and found anxiety surrounding going into work. She has found herself worrying with anxiety before she goes to bed and when she awakes feeling shaky. This has occurred for approximately 1-1/2 months. She continues to have a tension headache which she feels is secondary to her anxiety. Patient also endorses hot flashes that have been worsening over the last few months. She has tried Celexa in the past when her anxiety has increased, and has tolerated medication.  Of note her sinus issues are improving with antibiotic treatment. She is taking the Vistaril 25 mg daily at bedtime to help with sleep/anxiety but admits this does cause her quite a   Depression screen [facility] 2/9 10/02/2017 09/04/2017 03/23/2017 10/01/2015  Decreased Interest 2 0 0 0  Down, Depressed, Hopeless 1 0 0 0  PHQ - 2 Score 3 0 0 0  Altered sleeping 1 - - -  Tired, decreased energy 2 - - -  Change in appetite 0 - - -  Feeling bad or failure about yourself  0 - - -  Trouble concentrating 1 - - -  Moving slowly or fidgety/restless 0 - - -  Suicidal thoughts 0 - - -  PHQ-9 Score 7 - - -   GAD 7 : Generalized Anxiety Score 10/02/2017  Nervous,  Anxious, on Edge 3  Control/stop worrying 3  Worry too much - different things 3  Trouble relaxing 2  Restless 3  Easily annoyed or irritable 3  Afraid - awful might happen 3  Total GAD 7 Score 20    Allergies  Allergen Reactions  . Augmentin [Amoxicillin-Pot Clavulanate] Diarrhea  . Doxycycline Nausea And Vomiting  . Hydrocodone Itching   Social History   Tobacco Use  . Smoking status: Former Smoker    Packs/day: 0.50    Years: 36.00    Pack years: 18.00    Types: Cigarettes, E-cigarettes    Last attempt to quit: 02/25/2017    Years since quitting: 0.6  . Smokeless tobacco: Never Used  Substance Use Topics  . Alcohol use: Yes    Comment: rarely   Past Medical History:  Diagnosis Date  . Anemia   . Anxiety    use to use / take Lorazepam ., last episode of panic  2 yrs. ago  . Arthritis    hands, knees, back - Osteo  . Complication of anesthesia    woke up before the tube was removed   . Cystitis   . Facial nerve disorder   . GERD (gastroesophageal reflux disease)   . History of chicken pox   . Pneumonia    as a child-  hosp.   . Reflux   . Vaginal delivery    x2   Past Surgical History:  Procedure Laterality Date  . BREAST BIOPSY Left 2014   fatty tissue only  . CERVICAL DISCECTOMY    . CHOLECYSTECTOMY N/A 06/16/2014   Procedure: LAPAROSCOPIC CHOLECYSTECTOMY ;  Surgeon: Abigail Miyamotoouglas Blackman, MD;  [facility];  Service: General;  Laterality: N/A;  laparoscopic cholecystectomy  . LAPAROSCOPIC ABDOMINAL EXPLORATION     for endometrimosis   . NECK SURGERY  2014   Family History  Problem Relation Age of Onset  . Arthritis Mother   . Hyperlipidemia Mother   . Hypertension Mother   . Thyroid disease Mother        hypothyroid  . Arthritis Father   . Hyperlipidemia Father   . Hypertension Father   . Thyroid disease Brother   . Arthritis Maternal Grandmother   . Arthritis Maternal Grandfather   . Arthritis Paternal Grandmother   . Arthritis Paternal  Grandfather   . Breast cancer Maternal Aunt    Allergies as of 10/02/2017      Reactions   Augmentin [amoxicillin-pot Clavulanate] Diarrhea   Doxycycline Nausea And Vomiting   Hydrocodone Itching      Medication List        Accurate as of 10/02/17  5:13 PM. Always use your most recent med list.          citalopram 20 MG tablet Commonly known as:  CELEXA Take 1 tablet (20 mg total) by mouth daily.   cyclobenzaprine 5 MG tablet Commonly known as:  FLEXERIL Take 1 tablet (5 mg total) by mouth 2 (two) times daily as needed for muscle spasms.   hydrOXYzine 25 MG capsule Commonly known as:  VISTARIL Take 1 capsule (25 mg total) by mouth at bedtime.   triamcinolone cream 0.5 % Commonly known as:  KENALOG Apply 1 application topically 3 (three) times daily.       All past medical history, surgical history, allergies, family history, immunizations andmedications were updated in the EMR today and reviewed under the history and medication portions of their EMR.     ROS: Negative, with the exception of above mentioned in HPI   Objective:  BP 128/77 (BP Location: Right Arm, Patient Position: Sitting, Cuff Size: Normal)   Pulse 87   Temp 97.9 F (36.6 C)   Resp 20   Ht 5\' 6"  (1.676 m)   Wt 148 lb 4 oz (67.2 kg)   LMP 09/04/2017   SpO2 97%   BMI 23.93 kg/m  Body mass index is 23.93 kg/m. Gen: Afebrile. No acute distress. Nontoxic in appearance, well developed, well nourished.  HENT: AT. [place].MMM Eyes:Pupils Equal Round Reactive to light, Extraocular movements intact,  Conjunctiva without redness, discharge or icterus. CV: RRR  Chest: CTAB, no wheeze or crackles. Good air movement, normal resp effort.  Neuro:  Normal gait. PERLA. EOMi. Alert. Oriented x3  Psych: Tearful. Appears sad and overwhelmed, otherwise Normal affect, dress and demeanor. Normal speech. Normal thought content and judgment.  No exam data present No results found. No results found for this or any  previous visit (from the past 24 hour(s)).  Assessment/Plan: Brenda Ray is a 47 y.o. female present for OV for  Anxiety Hot flashes due to menopause Discussed multiple options with patient today and decided on starting Celexa 20 mg daily. This will hopefully help with her depression and anxiety and her hot flashes. Patient was offered counseling, but is without insurance  at this time. Discussed [facility] [place] for her to get in contact since they will provide income based rate care at times. Since she has been on this medication in the past and she has tolerated it, will follow-up in 8 weeks to see if dose needs to be adjusted.  Tension headache: -Flexeril 5 mg daily at bedtime first, if they can take up to twice a day when necessary as long as sedation is not an issue. Do not use Flexeril with Vistaril. Patient understands instructions.  Reviewed expectations re: course of current medical issues.  Discussed self-management of symptoms.  Outlined signs and symptoms indicating need for more acute intervention.  Patient verbalized understanding and all questions were answered.  Patient received an After-Visit Summary.    No orders of the defined types were placed in this encounter.    Note is dictated utilizing voice recognition software. Although note has been proof read prior to signing, occasional typographical errors still can be missed. If any questions arise, please do not hesitate to call for verification.   electronically signed by:  Felix Pacini, DO  [facility] - [place]

## 2017-10-02 NOTE — Patient Instructions (Signed)
Start Celexa daily this will help with anxiety and hot flashes.  Flexeril at night for headache/tension.  Follow up in 8 weeks.   Look into good Rx.  [facility]

## 2017-10-05 ENCOUNTER — Encounter: Payer: Self-pay | Admitting: Family Medicine

## 2017-10-05 ENCOUNTER — Ambulatory Visit (INDEPENDENT_AMBULATORY_CARE_PROVIDER_SITE_OTHER): Payer: Self-pay | Admitting: Family Medicine

## 2017-10-05 ENCOUNTER — Ambulatory Visit: Payer: Self-pay | Admitting: *Deleted

## 2017-10-05 VITALS — BP 119/75 | HR 94 | Temp 97.7°F | Ht 66.0 in | Wt 146.6 lb

## 2017-10-05 DIAGNOSIS — F410 Panic disorder [episodic paroxysmal anxiety] without agoraphobia: Secondary | ICD-10-CM

## 2017-10-05 DIAGNOSIS — F419 Anxiety disorder, unspecified: Secondary | ICD-10-CM

## 2017-10-05 DIAGNOSIS — Z79899 Other long term (current) drug therapy: Secondary | ICD-10-CM | POA: Insufficient documentation

## 2017-10-05 DIAGNOSIS — N951 Menopausal and female climacteric states: Secondary | ICD-10-CM

## 2017-10-05 DIAGNOSIS — Z0289 Encounter for other administrative examinations: Secondary | ICD-10-CM

## 2017-10-05 MED ORDER — LORAZEPAM 0.5 MG PO TABS: 0.5000 mg | ORAL_TABLET | Freq: Two times a day (BID) | ORAL | 5 refills | Status: DC | PRN

## 2017-10-05 NOTE — Telephone Encounter (Signed)
Contacted pt regarding her panic attacks; she states that her panic attacks have gotten worse; she also says that in the past she was given xanax and that helped; she further states that, on Friday 10/02/17 had 3 panic attacks going down the road; she did take a xanax from her mom and it did give her relie  she has not left her house this weekendf; she is requesting a prescription for xanax until her celexa has chance to work; will route request to office, and will call the pt back

## 2017-10-05 NOTE — Telephone Encounter (Signed)
Spoke with Diane at [facility]; she wil lforward request to provider; notified pt of request awaiting provider review; she verbalizes understanding.; the pt can be contacted at [phone].

## 2017-10-05 NOTE — Patient Instructions (Addendum)
Continue Celexa.  Start ativan up to tow times a day as needed only for panic. Face to face appt needed for refill.    I hope you feel better.

## 2017-10-05 NOTE — Progress Notes (Signed)
Brenda Ray , January 23, 1971, 47 y.o., female MRN: [id number] Patient Care Team    Relationship Specialty Notifications Start End  Natalia Leatherwood, DO PCP - General Family Medicine  05/01/15     Chief Complaint  Patient presents with  . Panic Attack    pt c/o panic attack X 4 days.     Subjective:   Anxiety/hot flashes/tension headache/panic attack Patient seen at the end of last week for increased anxiety, hot flashes and panic attacks. She reports that she had increased panic over the weekend. She has not experienced panic attacks many years. She had been prescribed Xanax in the past briefly for her panic attacks. She has started the Celexa Saturday. Prior note:  Patient presents today to discuss her anxiety. She reports she is feeling more overwhelmed. She has always been a persistent takes care of her family members around her. Her son recently moved to [place] and a loved one that was needing her to care for them has passed on. She has found since these changes her anxiety has increased. She abruptly quit her job. She reports there was nothing wrong with the job itself and she enjoyed her job. She will however did not want to go in to work anymore and found anxiety surrounding going into work. She has found herself worrying with anxiety before she goes to bed and when she awakes feeling shaky. This has occurred for approximately 1-1/2 months. She continues to have a tension headache which she feels is secondary to her anxiety. Patient also endorses hot flashes that have been worsening over the last few months. She has tried Celexa in the past when her anxiety has increased, and has tolerated medication.    Depression screen [facility] 2/9 10/05/2017 10/02/2017 09/04/2017 03/23/2017 10/01/2015  Decreased Interest 1 2 0 0 0  Down, Depressed, Hopeless 0 1 0 0 0  PHQ - 2 Score 1 3 0 0 0  Altered sleeping 1 1 - - -  Tired, decreased energy 1 2 - - -  Change in appetite 1 0 - - -  Feeling bad or  failure about yourself  0 0 - - -  Trouble concentrating 0 1 - - -  Moving slowly or fidgety/restless 0 0 - - -  Suicidal thoughts 0 0 - - -  PHQ-9 Score 4 7 - - -   GAD 7 : Generalized Anxiety Score 10/05/2017 10/02/2017  Nervous, Anxious, on Edge 2 3  Control/stop worrying 3 3  Worry too much - different things 3 3  Trouble relaxing 1 2  Restless 3 3  Easily annoyed or irritable 1 3  Afraid - awful might happen 0 3  Total GAD 7 Score 13 20  Anxiety Difficulty Not difficult at all -    Allergies  Allergen Reactions  . Augmentin [Amoxicillin-Pot Clavulanate] Diarrhea  . Doxycycline Nausea And Vomiting  . Hydrocodone Itching   Social History   Tobacco Use  . Smoking status: Former Smoker    Packs/day: 0.50    Years: 36.00    Pack years: 18.00    Types: Cigarettes, E-cigarettes    Last attempt to quit: 02/25/2017    Years since quitting: 0.6  . Smokeless tobacco: Never Used  Substance Use Topics  . Alcohol use: Yes    Comment: rarely   Past Medical History:  Diagnosis Date  . Anemia   . Anxiety    use to use / take Lorazepam ., last episode of panic  2 yrs. ago  . Arthritis    hands, knees, back - Osteo  . Complication of anesthesia    woke up before the tube was removed   . Cystitis   . Facial nerve disorder   . GERD (gastroesophageal reflux disease)   . History of chicken pox   . Pneumonia    as a child- hosp.   Marland Kitchen. Reflux   . Vaginal delivery    x2   Past Surgical History:  Procedure Laterality Date  . BREAST BIOPSY Left 2014   fatty tissue only  . CERVICAL DISCECTOMY    . CHOLECYSTECTOMY N/A 06/16/2014   Procedure: LAPAROSCOPIC CHOLECYSTECTOMY ;  Surgeon: Abigail Miyamotoouglas Blackman, MD;  [facility];  Service: General;  Laterality: N/A;  laparoscopic cholecystectomy  . LAPAROSCOPIC ABDOMINAL EXPLORATION     for endometrimosis   . NECK SURGERY  2014   Family History  Problem Relation Age of Onset  . Arthritis Mother   . Hyperlipidemia Mother   .  Hypertension Mother   . Thyroid disease Mother        hypothyroid  . Arthritis Father   . Hyperlipidemia Father   . Hypertension Father   . Thyroid disease Brother   . Arthritis Maternal Grandmother   . Arthritis Maternal Grandfather   . Arthritis Paternal Grandmother   . Arthritis Paternal Grandfather   . Breast cancer Maternal Aunt    Allergies as of 10/05/2017      Reactions   Augmentin [amoxicillin-pot Clavulanate] Diarrhea   Doxycycline Nausea And Vomiting   Hydrocodone Itching      Medication List        Accurate as of 10/05/17  3:50 PM. Always use your most recent med list.          citalopram 20 MG tablet Commonly known as:  CELEXA Take 1 tablet (20 mg total) by mouth daily.   cyclobenzaprine 5 MG tablet Commonly known as:  FLEXERIL Take 1 tablet (5 mg total) by mouth 2 (two) times daily as needed for muscle spasms.   hydrOXYzine 25 MG capsule Commonly known as:  VISTARIL Take 1 capsule (25 mg total) by mouth at bedtime.       All past medical history, surgical history, allergies, family history, immunizations andmedications were updated in the EMR today and reviewed under the history and medication portions of their EMR.     ROS: Negative, with the exception of above mentioned in HPI   Objective:  BP 119/75 (BP Location: Right Arm, Patient Position: Sitting, Cuff Size: Normal)   Pulse 94   Temp 97.7 F (36.5 C) (Oral)   Ht 5\' 6"  (1.676 m)   Wt 146 lb 9.6 oz (66.5 kg)   LMP 10/05/2017   SpO2 96%   BMI 23.66 kg/m  Body mass index is 23.66 kg/m. Gen: Afebrile. No acute distress. Nontoxic in appearance, well-developed, well-nourished, Caucasian female. HENT: AT. [place].MMM.  Eyes:Pupils Equal Round Reactive to light, Extraocular movements intact,  Conjunctiva without redness, discharge or icterus. CV: RRR  Psych: Normal affect, dress and demeanor. Normal speech. Normal thought content and judgment..    No exam data present No results found. No  results found for this or any previous visit (from the past 24 hour(s)).  Assessment/Plan: Brenda Ray is a 47 y.o. female present for OV for  Anxiety/panic attack Hot flashes due to menopause Continue Celexa 20 mg daily. This will hopefully help with her depression and anxiety and her hot flashes. [employer] database  reviewed.  Controled substance contract signed today.  Ativan 0.5 mg BID PRN prescribed #60, 5 refills for panic attacks only. Face to face encounter needed for refills.  Patient was offered counseling, but is without insurance at this time. Discussed [facility] [place] for her to get in contact since they will provide income based rate care at times. Since she has been on this medication in the past and she has tolerated it, will follow-up in 8 weeks to see if dose needs to be adjusted.    Reviewed expectations re: course of current medical issues.  Discussed self-management of symptoms.  Outlined signs and symptoms indicating need for more acute intervention.  Patient verbalized understanding and all questions were answered.  Patient received an After-Visit Summary.    No orders of the defined types were placed in this encounter.    Note is dictated utilizing voice recognition software. Although note has been proof read prior to signing, occasional typographical errors still can be missed. If any questions arise, please do not hesitate to call for verification.   electronically signed by:  Felix Pacini, DO  [facility] - OR

## 2017-10-05 NOTE — Telephone Encounter (Signed)
Left message for  Patient to call and set up an office visit she will need an appointment to be evaluated for this.

## 2017-10-05 NOTE — Telephone Encounter (Addendum)
Unfortunately, xanax and like meds are a controled substance and legally must have a face-to-face appt to prescribe.  I can see her today if she desires to start benzo.

## 2017-10-22 ENCOUNTER — Telehealth: Payer: Self-pay | Admitting: Family Medicine

## 2017-10-22 NOTE — Telephone Encounter (Signed)
Patient states she was recently prescribed citalopram (CELEXA) 20 MG tablet and was supposed to follow up with pcp to see how the medication is working.  Patient states she is self pay and unable to come in for follow up appt.   She reports the medication seems to be working and she feels much better.  However, she wants to know if she can increase the dosage.

## 2017-10-23 MED ORDER — CITALOPRAM HYDROBROMIDE 40 MG PO TABS: 40.0000 mg | ORAL_TABLET | Freq: Every day | ORAL | 1 refills | Status: DC

## 2017-10-23 NOTE — Telephone Encounter (Signed)
Pt was seen 3 x in March and states she cannot afford a follow up so soon. Please advise on follow up vs. increase in Celexa. Pt due for f/u in the beginning of May based on last ov note.

## 2017-10-23 NOTE — Telephone Encounter (Signed)
Patient notified and verbalized understanding. 

## 2017-10-23 NOTE — Telephone Encounter (Signed)
Please call pt: - We did discuss during her appts I would call in higher dose if needed since she is in between insurances. Since it was already discussed, I can increase her celexa dose to 40 mg a day. If she still has Celexa 20 mg tabs, she can take 2 pills until finished. Her New script called in today will be celexa 40 mg--> only take 1 after picking up new bottle! F/u 6 months as long as doing ok, sooner if needed. Glad she is feeling better.

## 2017-11-02 ENCOUNTER — Other Ambulatory Visit: Payer: Self-pay

## 2017-11-02 ENCOUNTER — Emergency Department (HOSPITAL_BASED_OUTPATIENT_CLINIC_OR_DEPARTMENT_OTHER)
Admission: EM | Admit: 2017-11-02 | Discharge: 2017-11-02 | Disposition: A | Discharge status: Home / Self Care | Payer: Self-pay | Attending: Emergency Medicine | Admitting: Emergency Medicine

## 2017-11-02 ENCOUNTER — Encounter (HOSPITAL_BASED_OUTPATIENT_CLINIC_OR_DEPARTMENT_OTHER): Payer: Self-pay | Admitting: Emergency Medicine

## 2017-11-02 ENCOUNTER — Ambulatory Visit: Payer: Self-pay | Admitting: *Deleted

## 2017-11-02 DIAGNOSIS — R55 Syncope and collapse: Secondary | ICD-10-CM | POA: Insufficient documentation

## 2017-11-02 DIAGNOSIS — R1013 Epigastric pain: Secondary | ICD-10-CM | POA: Insufficient documentation

## 2017-11-02 DIAGNOSIS — Z87891 Personal history of nicotine dependence: Secondary | ICD-10-CM | POA: Insufficient documentation

## 2017-11-02 DIAGNOSIS — Z79899 Other long term (current) drug therapy: Secondary | ICD-10-CM | POA: Insufficient documentation

## 2017-11-02 LAB — CBC
HCT: 38.4 % (ref 36.0–46.0)
Hemoglobin: 13.1 g/dL (ref 12.0–15.0)
MCH: 29.9 pg (ref 26.0–34.0)
MCHC: 34.1 g/dL (ref 30.0–36.0)
MCV: 87.7 fL (ref 78.0–100.0)
Platelets: 223 10*3/uL (ref 150–400)
RBC: 4.38 MIL/uL (ref 3.87–5.11)
RDW: 12.6 % (ref 11.5–15.5)
WBC: 8.6 10*3/uL (ref 4.0–10.5)

## 2017-11-02 LAB — URINALYSIS, ROUTINE W REFLEX MICROSCOPIC
Bilirubin Urine: NEGATIVE
Glucose, UA: NEGATIVE mg/dL
Ketones, ur: NEGATIVE mg/dL
Leukocytes, UA: NEGATIVE
Nitrite: NEGATIVE
Protein, ur: NEGATIVE mg/dL
Specific Gravity, Urine: <1.005 — ABNORMAL LOW (ref 1.005–1.030)
pH: 6 (ref 5.0–8.0)

## 2017-11-02 LAB — BASIC METABOLIC PANEL
Anion gap: 11 (ref 5–15)
BUN: 10 mg/dL (ref 6–20)
CO2: 24 mmol/L (ref 22–32)
Calcium: 9.3 mg/dL (ref 8.9–10.3)
Chloride: 100 mmol/L — ABNORMAL LOW (ref 101–111)
Creatinine, Ser: 0.74 mg/dL (ref 0.44–1.00)
GFR calc Af Amer: >60 mL/min (ref >60)
GFR calc non Af Amer: >60 mL/min (ref >60)
Glucose, Bld: 113 mg/dL — ABNORMAL HIGH (ref 65–99)
Potassium: 4.3 mmol/L (ref 3.5–5.1)
Sodium: 135 mmol/L (ref 135–145)

## 2017-11-02 LAB — URINALYSIS, MICROSCOPIC (REFLEX)

## 2017-11-02 LAB — TROPONIN I: Troponin I: <0.03 ng/mL (ref <0.03)

## 2017-11-02 LAB — PREGNANCY, URINE: Preg Test, Ur: NEGATIVE

## 2017-11-02 NOTE — ED Notes (Signed)
ED Provider at bedside. 
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
## 2017-11-02 NOTE — Telephone Encounter (Signed)
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   Reason for Disposition . [1] Fainted > 15 minutes ago AND [2] still feels weak or dizzy  Answer Assessment - Initial Assessment Questions 1. ONSET: "How long were you unconscious?" (minutes) "When did it happen?"     At work; not sure how long she was out 2. CONTENT: "What happened during period of unconsciousness?" (e.g., seizure activity)      "Not to her knowledge"  3. MENTAL STATUS: "Alert and oriented now?" (oriented x 3 = name, month, location)      yes 4. TRIGGER: "What do you think caused the fainting?" "What were you doing just before you fainted?"  (e.g., exercise, sudden standing up, prolonged standing)     She felt hot 5. RECURRENT SYMPTOM: "Have you ever passed out before?" If so, ask: "When was the last time?" and "What happened that time?"      Many years ago 6. INJURY: "Did you sustain any injury during the fall?"      She was sitting down 7. CARDIAC SYMPTOMS: "Have you had any of the following symptoms: chest pain, difficulty breathing, palpitations?"     "felt like gas, I was trying to belch" 8. NEUROLOGIC SYMPTOMS: "Have you had any of the following symptoms: headache, numbness, vertigo, weakness?"     no 9. GI SYMPTOMS: "Have you had any of the following symptoms: abdominal pain, vomiting, diarrhea, blood in stools?"     no 10. OTHER SYMPTOMS: "Do you have any other symptoms?"       no 11. PREGNANCY: "Is there any chance you are pregnant?" "When was your last menstrual period?"       No LMP ended 10/31/17  Protocols used: Hermine MessickFAINTING-A-AH

## 2017-11-03 ENCOUNTER — Ambulatory Visit (INDEPENDENT_AMBULATORY_CARE_PROVIDER_SITE_OTHER): Payer: Self-pay | Admitting: Family Medicine

## 2017-11-03 ENCOUNTER — Encounter: Payer: Self-pay | Admitting: Family Medicine

## 2017-11-03 VITALS — BP 102/66 | HR 60 | Temp 98.0°F | Ht 66.0 in | Wt 144.0 lb

## 2017-11-03 DIAGNOSIS — F419 Anxiety disorder, unspecified: Secondary | ICD-10-CM

## 2017-11-03 DIAGNOSIS — R1012 Left upper quadrant pain: Secondary | ICD-10-CM

## 2017-11-03 DIAGNOSIS — R634 Abnormal weight loss: Secondary | ICD-10-CM | POA: Insufficient documentation

## 2017-11-03 DIAGNOSIS — R55 Syncope and collapse: Secondary | ICD-10-CM

## 2017-11-03 MED ORDER — CITALOPRAM HYDROBROMIDE 20 MG PO TABS: 30.0000 mg | ORAL_TABLET | Freq: Every day | ORAL | 1 refills | Status: DC
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
## 2017-11-03 NOTE — Patient Instructions (Addendum)
Start Prilosec OTC daily. Try GasX or Beano.   We are going to first check thyroid panel.  I suspect you had a "vasovagal" episode after your abdominal discomfort. I am wondering if your abdominal discomfort was from a hiatal hernia pressure.   I also have concerns your thyroid maybe an issue with anxiety. We will test this today.     Hiatal Hernia A hiatal hernia occurs when part of the stomach slides above the muscle that separates the abdomen from the chest (diaphragm). A person can be born with a hiatal hernia (congenital), or it may develop over time. In almost all cases of hiatal hernia, only the top part of the stomach pushes through the diaphragm. Many people have a hiatal hernia with no symptoms. The larger the hernia, the more likely it is that you will have symptoms. In some cases, a hiatal hernia allows stomach acid to flow back into the tube that carries food from your mouth to your stomach (esophagus). This may cause heartburn symptoms. Severe heartburn symptoms may mean that you have developed a condition called gastroesophageal reflux disease (GERD). What are the causes? This condition is caused by a weakness in the opening (hiatus) where the esophagus passes through the diaphragm to attach to the upper part of the stomach. A person may be born with a weakness in the hiatus, or a weakness can develop over time. What increases the risk? This condition is more likely to develop in:  Older people. Age is a major risk factor for a hiatal hernia, especially if you are over the age of 150.  Pregnant women.  People who are overweight.  People who have frequent constipation.  What are the signs or symptoms? Symptoms of this condition usually develop in the form of GERD symptoms. Symptoms include:  Heartburn.  Belching.  Indigestion.  Trouble swallowing.  Coughing or wheezing.  Sore throat.  Hoarseness.  Chest pain.  Nausea and vomiting.  How is this  diagnosed? This condition may be diagnosed during testing for GERD. Tests that may be done include:  X-rays of your stomach or chest.  An upper gastrointestinal (GI) series. This is an X-ray exam of your GI tract that is taken after you swallow a chalky liquid that shows up clearly on the X-ray.  Endoscopy. This is a procedure to look into your stomach using a thin, flexible tube that has a tiny camera and light on the end of it.  How is this treated? This condition may be treated by:  Dietary and lifestyle changes to help reduce GERD symptoms.  Medicines. These may include: ? Over-the-counter antacids. ? Medicines that make your stomach empty more quickly. ? Medicines that block the production of stomach acid (H2 blockers). ? Stronger medicines to reduce stomach acid (proton pump inhibitors).  Surgery to repair the hernia, if other treatments are not helping.  If you have no symptoms, you may not need treatment. Follow these instructions at home: Lifestyle and activity  Do not use any products that contain nicotine or tobacco, such as cigarettes and e-cigarettes. If you need help quitting, ask your health care provider.  Try to achieve and maintain a healthy body weight.  Avoid putting pressure on your abdomen. Anything that puts pressure on your abdomen increases the amount of acid that may be pushed up into your esophagus. ? Avoid bending over, especially after eating. ? Raise the head of your bed by putting blocks under the legs. This keeps your head and esophagus  higher than your stomach. ? Do not wear tight clothing around your chest or stomach. ? Try not to strain when having a bowel movement, when urinating, or when lifting heavy objects. Eating and drinking  Avoid foods that can worsen GERD symptoms. These may include: ? Fatty foods, like fried foods. ? Citrus fruits, like oranges or lemon. ? Other foods and drinks that contain acid, like orange juice or  tomatoes. ? Spicy food. ? Chocolate.  Eat frequent small meals instead of three large meals a day. This helps prevent your stomach from getting too full. ? Eat slowly. ? Do not lie down right after eating. ? Do not eat 1-2 hours before bed.  Do not drink beverages with caffeine. These include cola, coffee, cocoa, and tea.  Do not drink alcohol. General instructions  Take over-the-counter and prescription medicines only as told by your health care provider.  Keep all follow-up visits as told by your health care provider. This is important. Contact a health care provider if:  Your symptoms are not controlled with medicines or lifestyle changes.  You are having trouble swallowing.  You have coughing or wheezing that will not go away. Get help right away if:  Your pain is getting worse.  Your pain spreads to your arms, neck, jaw, teeth, or back.  You have shortness of breath.  You sweat for no reason.  You feel sick to your stomach (nauseous) or you vomit.  You vomit blood.  You have bright red blood in your stools.  You have black, tarry stools. This information is not intended to replace advice given to you by your health care provider. Make sure you discuss any questions you have with your health care provider. Document Released: 10/04/2003 Document Revised: 07/07/2016 Document Reviewed: 07/07/2016 Elsevier Interactive Patient Education  [employer].

## 2017-11-03 NOTE — ED Provider Notes (Signed)
[facility] EMERGENCY DEPARTMENT Provider Note   CSN: [id number] Arrival date & time: 11/02/17  0807     History   Chief Complaint Chief Complaint  Patient presents with  . Loss of Consciousness    HPI Brenda Ray is a 47 y.o. female.  HPI   47 year old female with syncope.  Patient states that she was in vehicle driving around [place] this morning.  She started to feel very warm and flush.  She then began having some pain in her epigastrium which progressively increased.  She felt like there is a lot of pressure "like some gas was stuck there."  She was able to burp with some improvement.  She was able to pull the vehicle over and stop it.  She reportedly had a very brief loss of consciousness for a few seconds.  Quick return to baseline.  She currently has no pain.  She does feels somewhat generally tired.  She woke up in her usual state of health this morning.  She denies any past history of recurrent syncope.  Past Medical History:  Diagnosis Date  . Anemia   . Anxiety    use to use / take Lorazepam ., last episode of panic  2 yrs. ago  . Arthritis    hands, knees, back - Osteo  . Complication of anesthesia    woke up before the tube was removed   . Cystitis   . Facial nerve disorder   . GERD (gastroesophageal reflux disease)   . History of chicken pox   . Pneumonia    as a child- hosp.   Marland Kitchen Reflux   . Vaginal delivery    x2    Patient Active Problem List   Diagnosis Date Noted  . Benzodiazepine contract exists 10/05/2017  . Anxiety 10/02/2017  . Hot flashes due to menopause 10/02/2017  . Tension headache 10/02/2017  . Stress incontinence 10/01/2015  . Encounter for preventive health examination 10/01/2015  . Left breast mass 10/01/2015  . Vitamin D deficiency 10/01/2015  . Vitamin B12 deficiency 10/01/2015  . Tobacco abuse 05/01/2015    Past Surgical History:  Procedure Laterality Date  . BREAST BIOPSY Left 2014   fatty tissue only  . CERVICAL  DISCECTOMY    . CHOLECYSTECTOMY N/A 06/16/2014   Procedure: LAPAROSCOPIC CHOLECYSTECTOMY ;  Surgeon: Abigail Miyamoto, MD;  Location: [facility] OR;  Service: General;  Laterality: N/A;  laparoscopic cholecystectomy  . LAPAROSCOPIC ABDOMINAL EXPLORATION     for endometrimosis   . NECK SURGERY  2014     OB History   None      Home Medications    Prior to Admission medications   Medication Sig Start Date End Date Taking? Authorizing Provider  citalopram (CELEXA) 40 MG tablet Take 1 tablet (40 mg total) by mouth daily. 10/23/17   Kuneff, Renee A, DO  cyclobenzaprine (FLEXERIL) 5 MG tablet Take 1 tablet (5 mg total) by mouth 2 (two) times daily as needed for muscle spasms. 10/02/17   Kuneff, Renee A, DO  LORazepam (ATIVAN) 0.5 MG tablet Take 1 tablet (0.5 mg total) by mouth 2 (two) times daily as needed for anxiety. 10/05/17   Natalia Leatherwood, DO    Family History Family History  Problem Relation Age of Onset  . Arthritis Mother   . Hyperlipidemia Mother   . Hypertension Mother   . Thyroid disease Mother        hypothyroid  . Arthritis Father   . Hyperlipidemia Father   .  Hypertension Father   . Thyroid disease Brother   . Arthritis Maternal Grandmother   . Arthritis Maternal Grandfather   . Arthritis Paternal Grandmother   . Arthritis Paternal Grandfather   . Breast cancer Maternal Aunt     Social History Social History   Tobacco Use  . Smoking status: Former Smoker    Packs/day: 0.50    Years: 36.00    Pack years: 18.00    Types: Cigarettes, E-cigarettes    Last attempt to quit: 02/25/2017    Years since quitting: 0.6  . Smokeless tobacco: Never Used  Substance Use Topics  . Alcohol use: Yes    Comment: rarely  . Drug use: No     Allergies   Augmentin [amoxicillin-pot clavulanate]; Doxycycline; and Hydrocodone   Review of Systems Review of Systems  All systems reviewed and negative, other than as noted in HPI.  Physical Exam Updated Vital Signs BP 98/72 (BP  Location: Left Arm)   Pulse (!) 59   Temp 98 F (36.7 C) (Oral)   Resp 10   Ht 5\' 6"  (1.676 m)   Wt 67.6 kg (149 lb)   LMP 10/26/2017   SpO2 99%   BMI 24.05 kg/m   Physical Exam  Constitutional: She appears well-developed and well-nourished. No distress.  HENT:  Head: Normocephalic and atraumatic.  Eyes: Conjunctivae are normal. Right eye exhibits no discharge. Left eye exhibits no discharge.  Neck: Neck supple.  Cardiovascular: Normal rate, regular rhythm and normal heart sounds. Exam reveals no gallop and no friction rub.  No murmur heard. Pulmonary/Chest: Effort normal and breath sounds normal. No respiratory distress.  Abdominal: Soft. She exhibits no distension. There is no tenderness.  Musculoskeletal: She exhibits no edema or tenderness.  Neurological: She is alert.  Skin: Skin is warm and dry.  Psychiatric: She has a normal mood and affect. Her behavior is normal. Thought content normal.  Nursing note and vitals reviewed.    ED Treatments / Results  Labs (all labs ordered are listed, but only abnormal results are displayed) Labs Reviewed  BASIC METABOLIC PANEL - Abnormal; Notable for the following components:      Result Value   Chloride 100 (*)    Glucose, Bld 113 (*)    All other components within normal limits  URINALYSIS, ROUTINE W REFLEX MICROSCOPIC - Abnormal; Notable for the following components:   Specific Gravity, Urine <1.005 (*)    Hgb urine dipstick MODERATE (*)    All other components within normal limits  URINALYSIS, MICROSCOPIC (REFLEX) - Abnormal; Notable for the following components:   Bacteria, UA FEW (*)    Squamous Epithelial / LPF 0-5 (*)    All other components within normal limits  CBC  PREGNANCY, URINE  TROPONIN I    EKG EKG Interpretation  Date/Time:  Monday November 02 2017 08:18:38 EDT Ventricular Rate:  61 PR Interval:    QRS Duration: 97 QT Interval:  441 QTC Calculation: 445 R Axis:   65 Text Interpretation:  Sinus  rhythm No significant change since last tracing Confirmed by Raeford Razor [phone]) on 11/02/2017 9:15:07 AM   Radiology No results found.  Procedures Procedures (including critical care time)  Medications Ordered in ED Medications - No data to display   Initial Impression / Assessment and Plan / ED Course  I have reviewed the triage vital signs and the nursing notes.  Pertinent labs & imaging results that were available during my care of the patient were reviewed by me  and considered in my medical decision making (see chart for details).     47 year old female with syncope.  Vasovagal?  Afebrile.  He dynamically stable.  Really no acute complaints currently aside from feeling somewhat tired.  No particularly concerning features at this time.  No recurrent nature.  I doubt emergent process.  Strict return precautions were discussed outpatient follow-up otherwise.  Final Clinical Impressions(s) / ED Diagnoses   Final diagnoses:  Syncope, unspecified syncope type    ED Discharge Orders    None       Raeford RazorKohut, Momin Misko, MD 11/03/17 1012

## 2017-11-04 ENCOUNTER — Telehealth: Payer: Self-pay | Admitting: Family Medicine

## 2017-11-04 LAB — TSH: TSH: 2.32 m[IU]/L

## 2017-11-04 LAB — T3, FREE: T3, Free: 3.2 pg/mL (ref 2.3–4.2)

## 2017-11-04 LAB — T4, FREE: Free T4: 1 ng/dL (ref 0.8–1.8)

## 2017-11-04 LAB — LIPASE: Lipase: 14 U/L (ref 7–60)

## 2017-11-04 NOTE — Telephone Encounter (Signed)
Left detailed message with results and instructions on patient voice mail per DPR 

## 2017-11-04 NOTE — Telephone Encounter (Signed)
Please inform patient the following information: Her labs are normal. Remind her to start the OTC Prilosec.

## 2017-11-06 ENCOUNTER — Encounter: Payer: Self-pay | Admitting: Family Medicine

## 2018-06-01 ENCOUNTER — Other Ambulatory Visit: Payer: Self-pay

## 2018-06-01 MED ORDER — CITALOPRAM HYDROBROMIDE 20 MG PO TABS: 30.0000 mg | ORAL_TABLET | Freq: Every day | ORAL | 0 refills | Status: DC

## 2018-06-08 ENCOUNTER — Ambulatory Visit: Payer: BLUE CROSS/BLUE SHIELD | Admitting: Family Medicine

## 2018-06-08 ENCOUNTER — Encounter: Payer: Self-pay | Admitting: Family Medicine

## 2018-06-08 VITALS — BP 118/74 | HR 70 | Temp 97.7°F | Resp 20 | Ht 66.0 in | Wt 157.6 lb

## 2018-06-08 DIAGNOSIS — Z23 Encounter for immunization: Secondary | ICD-10-CM

## 2018-06-08 DIAGNOSIS — F419 Anxiety disorder, unspecified: Secondary | ICD-10-CM

## 2018-06-08 DIAGNOSIS — Z79899 Other long term (current) drug therapy: Secondary | ICD-10-CM | POA: Diagnosis not present

## 2018-06-08 DIAGNOSIS — F410 Panic disorder [episodic paroxysmal anxiety] without agoraphobia: Secondary | ICD-10-CM

## 2018-06-08 DIAGNOSIS — N951 Menopausal and female climacteric states: Secondary | ICD-10-CM

## 2018-06-08 MED ORDER — LORAZEPAM 0.5 MG PO TABS: 0.5000 mg | ORAL_TABLET | Freq: Two times a day (BID) | ORAL | 5 refills | Status: DC | PRN

## 2018-06-08 MED ORDER — CITALOPRAM HYDROBROMIDE 20 MG PO TABS: 30.0000 mg | ORAL_TABLET | Freq: Every day | ORAL | 1 refills | Status: DC

## 2018-06-08 NOTE — Progress Notes (Signed)
Brenda Ray , [birth year], 47 y.o., female MRN: [id number] Patient Care Team    Relationship Specialty Notifications Start End  Natalia Leatherwood, DO PCP - General Family Medicine  05/01/15     Chief Complaint  Patient presents with  . Anxiety     Subjective:   Anxiety/hot flashes:  Pt reports she is doing really great on current regimen of celexa 30 mg Qd and ativan BID prn. Benzo contract in place. She reports she uses an ativan about 1-2 x a week.   Prior note: Patient presents today to discuss her anxiety. She reports she is feeling more overwhelmed. She has always been a persistent takes care of her family members around her. Her son recently moved to [place] and a loved one that was needing her to care for them has passed on. She has found since these changes her anxiety has increased. She abruptly quit her job. She reports there was nothing wrong with the job itself and she enjoyed her job. She will however did not want to go in to work anymore and found anxiety surrounding going into work. She has found herself worrying with anxiety before she goes to bed and when she awakes feeling shaky. This has occurred for approximately 1-1/2 months. She continues to have a tension headache which she feels is secondary to her anxiety. Patient also endorses hot flashes that have been worsening over the last few months. She has tried Celexa in the past when her anxiety has increased, and has tolerated medication.  Of note her sinus issues are improving with antibiotic treatment. She is taking the Vistaril 25 mg daily at bedtime to help with sleep/anxiety but admits this does cause her quite a   Depression screen [facility] 2/9 06/08/2018 11/03/2017 10/05/2017 10/02/2017 09/04/2017  Decreased Interest 0 0 1 2 0  Down, Depressed, Hopeless 0 0 0 1 0  PHQ - 2 Score 0 0 1 3 0  Altered sleeping - - 1 1 -  Tired, decreased energy - - 1 2 -  Change in appetite - - 1 0 -  Feeling bad or failure about yourself  - -  0 0 -  Trouble concentrating - - 0 1 -  Moving slowly or fidgety/restless - - 0 0 -  Suicidal thoughts - - 0 0 -  PHQ-9 Score - - 4 7 -   GAD 7 : Generalized Anxiety Score 06/08/2018 10/05/2017 10/02/2017  Nervous, Anxious, on Edge 0 2 3  Control/stop worrying 0 3 3  Worry too much - different things 0 3 3  Trouble relaxing 0 1 2  Restless 0 3 3  Easily annoyed or irritable 0 1 3  Afraid - awful might happen 0 0 3  Total GAD 7 Score 0 13 20  Anxiety Difficulty Not difficult at all Not difficult at all -    Allergies  Allergen Reactions  . Augmentin [Amoxicillin-Pot Clavulanate] Diarrhea  . Doxycycline Nausea And Vomiting  . Hydrocodone Itching   Social History   Tobacco Use  . Smoking status: Former Smoker    Packs/day: 0.50    Years: 36.00    Pack years: 18.00    Types: Cigarettes, E-cigarettes    Last attempt to quit: 02/25/2017    Years since quitting: 1.2  . Smokeless tobacco: Never Used  Substance Use Topics  . Alcohol use: Yes    Comment: rarely   Past Medical History:  Diagnosis Date  . Anemia   .  Anxiety    use to use / take Lorazepam ., last episode of panic  2 yrs. ago  . Arthritis    hands, knees, back - Osteo  . Colon polyps    she reports colon polyps about 20 years ago; she was told 5 yr follow up.   . Complication of anesthesia    woke up before the tube was removed   . Cystitis   . Facial nerve disorder   . GERD (gastroesophageal reflux disease)   . Hiatal hernia    reports seen on [facility] completed in her 20s [facility]. Completed for pain and FOBT +.   . History of chicken pox   . Pneumonia    as a child- hosp.   . Vaginal delivery    x2   Past Surgical History:  Procedure Laterality Date  . BREAST BIOPSY Left 2014   fatty tissue only  . CERVICAL DISCECTOMY    . CHOLECYSTECTOMY N/A 06/16/2014   Procedure: LAPAROSCOPIC CHOLECYSTECTOMY ;  Surgeon: Abigail Miyamoto, MD;  Location: [facility] OR;  Service: General;  Laterality: N/A;  laparoscopic  cholecystectomy  . LAPAROSCOPIC ABDOMINAL EXPLORATION     for endometrimosis   . NECK SURGERY  2014   Family History  Problem Relation Age of Onset  . Arthritis Mother   . Hyperlipidemia Mother   . Hypertension Mother   . Thyroid disease Mother        hypothyroid  . Arthritis Father   . Hyperlipidemia Father   . Hypertension Father   . Thyroid disease Brother   . Arthritis Maternal Grandmother   . Arthritis Maternal Grandfather   . Arthritis Paternal Grandmother   . Arthritis Paternal Grandfather   . Breast cancer Maternal Aunt    Allergies as of 06/08/2018      Reactions   Augmentin [amoxicillin-pot Clavulanate] Diarrhea   Doxycycline Nausea And Vomiting   Hydrocodone Itching      Medication List        Accurate as of 06/08/18  2:56 PM. Always use your most recent med list.          citalopram 20 MG tablet Commonly known as:  CELEXA Take 1.5 tablets (30 mg total) by mouth daily. Needs follow up appointment for further refills.   LORazepam 0.5 MG tablet Commonly known as:  ATIVAN Take 1 tablet (0.5 mg total) by mouth 2 (two) times daily as needed for anxiety.       All past medical history, surgical history, allergies, family history, immunizations andmedications were updated in the EMR today and reviewed under the history and medication portions of their EMR.     ROS: Negative, with the exception of above mentioned in HPI   Objective:  BP 118/74 (BP Location: Right Arm, Patient Position: Sitting, Cuff Size: Normal)   Pulse 70   Temp 97.7 F (36.5 C)   Resp 20   Ht 5\' 6"  (1.676 m)   Wt 157 lb 9.6 oz (71.5 kg)   SpO2 98%   BMI 25.44 kg/m  Body mass index is 25.44 kg/m. Gen: Afebrile. No acute distress. Nontoxic, pleasant caucasian female.   HENT: AT. [place].MMM Eyes:Pupils Equal Round Reactive to light, Extraocular movements intact,  Conjunctiva without redness, discharge or icterus. CV: RRR  Chest: CTAB, no wheeze or crackles Neuro: Normal gait.  PERLA. EOMi. Alert. Oriented x3 Psych: Normal affect, dress and demeanor. Normal speech. Normal thought content and judgment.    No exam data present No results found. No  results found for this or any previous visit (from the past 24 hour(s)).  Assessment/Plan: Brenda Ray is a 47 y.o. female present for OV for  Anxiety Hot flashes due to menopause - stable. Doing very well with anxiety and hot flashes. - refills provided on celexa 30 mg QD and ativan 0.5 mg BID PRN. - [facility] database reviewed 06/08/18 - f/u 6 mos.   Flu shot administered today   Reviewed expectations re: course of current medical issues.  Discussed self-management of symptoms.  Outlined signs and symptoms indicating need for more acute intervention.  Patient verbalized understanding and all questions were answered.  Patient received an After-Visit Summary.    Orders Placed This Encounter  Procedures  . Flu Vaccine QUAD 6+ mos PF IM (Fluarix Quad PF)     Note is dictated utilizing voice recognition software. Although note has been proof read prior to signing, occasional typographical errors still can be missed. If any questions arise, please do not hesitate to call for verification.   electronically signed by:  Felix Pacinienee , DO  [facility] - OR

## 2018-06-08 NOTE — Patient Instructions (Signed)
I am glad you are doing well.  I have refilled your meds today.  F/U 6 mos for chronic conditions.    Please help us help you:  [facility] are [facility]. Below you will find basic instructions that you may need to access in the future. Please help us help you by reading the instructions, which cover many of the frequent questions we experience.   Prescription refills and request:  -In order to allow more efficient response time, please call your pharmacy for all refills. They will forward the request electronically to us. This allows for the quickest possible response. Request left on a nurse line can take longer to refill, since these are checked as time allows between office patients and other phone calls.  - refill request can take up to 3-5 working days to complete.  - If request is sent electronically and request is appropiate, it is usually completed in 1-2 business days.  - all patients will need to be seen routinely for all chronic medical conditions requiring prescription medications (see follow-up below). If you are overdue for follow up on your condition, you will be asked to make an appointment and we will call in enough medication to cover you until your appointment (up to 30 days).  - all controlled substances will require a face to face visit to request/refill.  - if you desire your prescriptions to go through a new pharmacy, and have an active script at original pharmacy, you will need to call your pharmacy and have scripts transferred to new pharmacy. This is completed between the pharmacy locations and not by your provider.    Results: If any images or labs were ordered, it can take up to 1 week to get results depending on the test ordered and the lab/facility running and resulting the test. - Normal or stable results, which do not need further discussion, may be released to your mychart immediately with attached note to you. A  call may not be generated for normal results. Please make certain to sign up for mychart. If you have questions on how to activate your mychart you can call the front office.  - If your results need further discussion, our office will attempt to contact you via phone, and if unable to reach you after 2 attempts, we will release your abnormal result to your mychart with instructions.  - All results will be automatically released in mychart after 1 week.  - Your provider will provide you with explanation and instruction on all relevant material in your results. Please keep in mind, results and labs may appear confusing or abnormal to the untrained eye, but it does not mean they are actually abnormal for you personally. If you have any questions about your results that are not covered, or you desire more detailed explanation than what was provided, you should make an appointment with your provider to do so.   Our office handles many outgoing and incoming calls daily. If we have not contacted you within 1 week about your results, please check your mychart to see if there is a message first and if not, then contact our office.  In helping with this matter, you help decrease call volume, and therefore allow us to be able to respond to patients needs more efficiently.   Acute office visits (sick visit):  An acute visit is intended for a new problem and are scheduled in shorter time slots to  allow schedule openings for patients with new problems. This is the appropriate visit to discuss a new problem. Problems will not be addressed by phone call or Echart message. Appointment is needed if requesting treatment. In order to provide you with excellent quality medical care with proper time for you to explain your problem, have an exam and receive treatment with instructions, these appointments should be limited to one new problem per visit. If you experience a new problem, in which you desire to be addressed, please  make an acute office visit, we save openings on the schedule to accommodate you. Please do not save your new problem for any other type of visit, let us take care of it properly and quickly for you.   Follow up visits:  Depending on your condition(s) your provider will need to see you routinely in order to provide you with quality care and prescribe medication(s). Most chronic conditions (Example: hypertension, Diabetes, depression/anxiety... etc), require visits a couple times a year. Your provider will instruct you on proper follow up for your personal medical conditions and history. Please make certain to make follow up appointments for your condition as instructed. Failing to do so could result in lapse in your medication treatment/refills. If you request a refill, and are overdue to be seen on a condition, we will always provide you with a 30 day script (once) to allow you time to schedule.    Medicare wellness (well visit): - we have a wonderful Nurse Maudie Mercury), that will meet with you and provide you will yearly medicare wellness visits. These visits should occur yearly (can not be scheduled less than 1 calendar year apart) and cover preventive health, immunizations, advance directives and screenings you are entitled to yearly through your medicare benefits. Do not miss out on your entitled benefits, this is when medicare will pay for these benefits to be ordered for you.  These are strongly encouraged by your provider and is the appropriate type of visit to make certain you are up to date with all preventive health benefits. If you have not had your medicare wellness exam in the last 12 months, please make certain to schedule one by calling the office and schedule your medicare wellness with Maudie Mercury as soon as possible.   Yearly physical (well visit):  - Adults are recommended to be seen yearly for physicals. Check with your insurance and date of your last physical, most insurances require one calendar  year between physicals. Physicals include all preventive health topics, screenings, medical exam and labs that are appropriate for gender/age and history. You may have fasting labs needed at this visit. This is a well visit (not a sick visit), new problems should not be covered during this visit (see acute visit).  - Pediatric patients are seen more frequently when they are younger. Your provider will advise you on well child visit timing that is appropriate for your their age. - This is not a medicare wellness visit. Medicare wellness exams do not have an exam portion to the visit. Some medicare companies allow for a physical, some do not allow a yearly physical. If your medicare allows a yearly physical you can schedule the medicare wellness with our nurse Maudie Mercury and have your physical with your provider after, on the same day. Please check with insurance for your full benefits.   Late Policy/No Shows:  - all new patients should arrive 15-30 minutes earlier than appointment to allow [place] time  to  obtain all personal demographics,  insurance information and for you to complete office paperwork. - All established patients should arrive 10-15 minutes earlier than appointment time to update all information and be checked in .  - In our best efforts to run on time, if you are late for your appointment you will be asked to either reschedule or if able, we will work you back into the schedule. There will be a wait time to work you back in the schedule,  depending on availability.  - If you are unable to make it to your appointment as scheduled, please call 24 hours ahead of time to allow [place] to fill the time slot with someone else who needs to be seen. If you do not cancel your appointment ahead of time, you may be charged a no show fee.

## 2018-07-07 ENCOUNTER — Encounter: Payer: BLUE CROSS/BLUE SHIELD | Admitting: Family Medicine

## 2018-07-30 ENCOUNTER — Encounter: Payer: Self-pay | Admitting: Family Medicine

## 2018-07-30 ENCOUNTER — Ambulatory Visit: Payer: BLUE CROSS/BLUE SHIELD | Admitting: Family Medicine

## 2018-07-30 ENCOUNTER — Other Ambulatory Visit: Payer: Self-pay

## 2018-07-30 VITALS — BP 117/69 | HR 78 | Temp 97.9°F | Resp 14 | Ht 66.0 in | Wt 162.0 lb

## 2018-07-30 DIAGNOSIS — M25512 Pain in left shoulder: Secondary | ICD-10-CM

## 2018-07-30 MED ORDER — NAPROXEN 500 MG PO TABS: 500.0000 mg | ORAL_TABLET | Freq: Two times a day (BID) | ORAL | 0 refills | Status: DC

## 2018-07-30 MED ORDER — MELOXICAM 15 MG PO TABS: 15.0000 mg | ORAL_TABLET | Freq: Every day | ORAL | 1 refills | Status: DC

## 2018-07-30 NOTE — Patient Instructions (Signed)
Start naproxen every 12 hours with food- for 7 days, then start mobic every day - once  a day.  This may be rotator cuff or your neck--> exam is consistent with shoulder etiology.     Rotator Cuff Tendinitis  Rotator cuff tendinitis is inflammation of the tough, cord-like bands that connect muscle to bone (tendons) in the rotator cuff. The rotator cuff includes all of the muscles and tendons that connect the arm to the shoulder. The rotator cuff holds the head of the upper arm bone (humerus) in the cup (fossa) of the shoulder blade (scapula). This condition can lead to a long-lasting (chronic) tear. The tear may be partial or complete. What are the causes? This condition is usually caused by overusing the rotator cuff. What increases the risk? This condition is more likely to develop in athletes and workers who frequently use their shoulder or reach over their heads. This can include activities such as:  Tennis.  Baseball or softball.  Swimming.  Construction work.  Painting. What are the signs or symptoms? Symptoms of this condition include:  Pain spreading (radiating) from the shoulder to the upper arm.  Swelling and tenderness in front of the shoulder.  Pain when reaching, pulling, or lifting the arm above the head.  Pain when lowering the arm from above the head.  Minor pain in the shoulder when resting.  Increased pain in the shoulder at night.  Difficulty placing the arm behind the back. How is this diagnosed? This condition is diagnosed with a medical history and physical exam. Tests may also be done, including:  X-rays.  MRI.  Ultrasounds.  CT or MR arthrogram. During this test, a contrast material is injected and then images are taken. How is this treated? Treatment for this condition depends on the severity of the condition. In less severe cases, treatment may include:  Rest. This may be done with a sling that holds the shoulder still (immobilization).  Your health care provider may also recommend avoiding activities that involve lifting your arm over your head.  Icing the shoulder.  Anti-inflammatory medicines, such as aspirin or ibuprofen. In more severe cases, treatment may include:  Physical therapy.  Steroid injections.  Surgery. Follow these instructions at home: If you have a sling:  Wear the sling as told by your health care provider. Remove it only as told by your health care provider.  Loosen the sling if your fingers tingle, become numb, or turn cold and blue.  Keep the sling clean.  If the sling is not waterproof, do not let it get wet. Remove it, if allowed, or cover it with a watertight covering when you take a bath or shower. Managing pain, stiffness, and swelling  If directed, put ice on the injured area. ? If you have a removable sling, remove it as told by your health care provider. ? Put ice in a plastic bag. ? Place a towel between your skin and the bag. ? Leave the ice on for 20 minutes, 2-3 times a day.  Move your fingers often to avoid stiffness and to lessen swelling.  Raise (elevate) the injured area above the level of your heart while you are lying down.  Find a comfortable sleeping position or sleep on a recliner, if available. Driving  Do not drive or use heavy machinery while taking prescription pain medicine.  Ask your health care provider when it is safe to drive if you have a sling on your arm. Activity  Rest your  shoulder as told by your health care provider.  Return to your normal activities as told by your health care provider. Ask your health care provider what activities are safe for you.  Do any exercises or stretches as told by your health care provider.  If you do repetitive overhead tasks, take small breaks in between and include stretching exercises as told by your health care provider. General instructions  Do not use any products that contain nicotine or tobacco, such as  cigarettes and e-cigarettes. These can delay healing. If you need help quitting, ask your health care provider.  Take over-the-counter and prescription medicines only as told by your health care provider.  Keep all follow-up visits as told by your health care provider. This is important. Contact a health care provider if:  Your pain gets worse.  You have new pain in your arm, hands, or fingers.  Your pain is not relieved with medicine or does not get better after 6 weeks of treatment.  You have cracking sensations when moving your shoulder in certain directions.  You hear a snapping sound after using your shoulder, followed by severe pain and weakness. Get help right away if:  Your arm, hand, or fingers are numb or tingling.  Your arm, hand, or fingers are swollen or painful or they turn white or blue. Summary  Rotator cuff tendinitis is inflammation of the tough, cord-like bands that connect muscle to bone (tendons) in the rotator cuff.  This condition is usually caused by overusing the rotator cuff, which includes all of the muscles and tendons that connect the arm to the shoulder.  This condition is more likely to develop in athletes and workers who frequently use their shoulder or reach over their heads.  Treatment generally includes rest, anti-inflammatory medicines, and icing. In some cases, physical therapy and steroid injections may be needed. In severe cases, surgery may be needed. This information is not intended to replace advice given to you by your health care provider. Make sure you discuss any questions you have with your health care provider. Document Released: 10/04/2003 Document Revised: 06/30/2016 Document Reviewed: 06/30/2016 Elsevier Interactive Patient Education  2019 [employer].

## 2018-07-30 NOTE — Progress Notes (Signed)
Brenda Ray , 14-Aug-1970, 48 y.o., female MRN: [id number] Patient Care Team    Relationship Specialty Notifications Start End  Natalia Leatherwood, DO PCP - General Family Medicine  05/01/15     Chief Complaint  Patient presents with  . Shoulder Pain    x 3 weeks. Symptoms worsening. Left shoulder pain.      Subjective: Pt presents for an OV with complaints of left shoulder pain of 6 months duration.  Associated symptoms include worsening over the last 3 weeks.  She denies any known injury to her shoulder or prior injury.  Denies any increase in activity prior to onset of worsening symptoms.  She reports pain in audible popping sounds when lifting her arm.  She has tried ibuprofen which was not helpful.  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  SHe denies radiation of pain down her arm.  SHe does feel like her fingers swell sometimes.   Depression screen [facility] 2/9 06/08/2018 11/03/2017 10/05/2017 10/02/2017 09/04/2017  Decreased Interest 0 0 1 2 0  Down, Depressed, Hopeless 0 0 0 1 0  PHQ - 2 Score 0 0 1 3 0  Altered sleeping - - 1 1 -  Tired, decreased energy - - 1 2 -  Change in appetite - - 1 0 -  Feeling bad or failure about yourself  - - 0 0 -  Trouble concentrating - - 0 1 -  Moving slowly or fidgety/restless - - 0 0 -  Suicidal thoughts - - 0 0 -  PHQ-9 Score - - 4 7 -    Allergies  Allergen Reactions  . Augmentin [Amoxicillin-Pot Clavulanate] Diarrhea  . Doxycycline Nausea And Vomiting  . Hydrocodone Itching   Social History   Tobacco Use  . Smoking status: Former Smoker    Packs/day: 0.50    Years: 36.00    Pack years: 18.00    Types: Cigarettes, E-cigarettes    Last attempt to quit: 02/25/2017    Years since quitting: 1.4  . Smokeless tobacco: Never Used  Substance Use Topics  . Alcohol use: Yes    Comment: rarely   Past Medical History:  Diagnosis Date  . Anemia   . Anxiety    use to use / take Lorazepam ., last episode of panic   2 yrs. ago  . Arthritis    hands, knees, back - Osteo  . Colon polyps    she reports colon polyps about 20 years ago; she was told 5 yr follow up.   . Complication of anesthesia    woke up before the tube was removed   . Cystitis   . Facial nerve disorder   . GERD (gastroesophageal reflux disease)   . Hiatal hernia    reports seen on EGD completed in her 20s in [facility]. Completed for pain and FOBT +.   . History of chicken pox   . Pneumonia    as a child- hosp.   . Vaginal delivery    x2   Past Surgical History:  Procedure Laterality Date  . BREAST BIOPSY Left 2014   fatty tissue only  . CERVICAL DISCECTOMY    . CHOLECYSTECTOMY N/A 06/16/2014   Procedure: LAPAROSCOPIC CHOLECYSTECTOMY ;  Surgeon: Abigail Miyamoto, MD;  Location: [facility] OR;  Service: General;  Laterality: N/A;  laparoscopic cholecystectomy  . LAPAROSCOPIC ABDOMINAL EXPLORATION     for endometrimosis   . NECK SURGERY  2014   Family History  Problem Relation Age of Onset  . Arthritis Mother   . Hyperlipidemia Mother   . Hypertension Mother   . Thyroid disease Mother        hypothyroid  . Arthritis Father   . Hyperlipidemia Father   . Hypertension Father   . Thyroid disease Brother   . Arthritis Maternal Grandmother   . Arthritis Maternal Grandfather   . Arthritis Paternal Grandmother   . Arthritis Paternal Grandfather   . Breast cancer Maternal Aunt    Allergies as of 07/30/2018      Reactions   Augmentin [amoxicillin-pot Clavulanate] Diarrhea   Doxycycline Nausea And Vomiting   Hydrocodone Itching      Medication List       Accurate as of July 30, 2018  3:29 PM. Always use your most recent med list.        citalopram 20 MG tablet Commonly known as:  CELEXA Take 1.5 tablets (30 mg total) by mouth daily. Needs follow up appointment for further refills.   LORazepam 0.5 MG tablet Commonly known as:  ATIVAN Take 1 tablet (0.5 mg total) by mouth 2 (two) times daily as needed for anxiety.         All past medical history, surgical history, allergies, family history, immunizations andmedications were updated in the EMR today and reviewed under the history and medication portions of their EMR.     ROS: Negative, with the exception of above mentioned in HPI   Objective:  BP 117/69   Pulse 78   Temp 97.9 F (36.6 C) (Oral)   Resp 14   Ht 5\' 6"  (1.676 m)   Wt 162 lb (73.5 kg)   SpO2 97%   BMI 26.15 kg/m  Body mass index is 26.15 kg/m. Gen: Afebrile. No acute distress. Nontoxic in appearance, well developed, well nourished.  HENT: AT. [place]. MMM Eyes:Pupils Equal Round Reactive to light, Extraocular movements intact,  Conjunctiva without redness, discharge or icterus. MSK: No erythema, no soft tissue swelling.  Decreased range of motion in abduction less than 90 degrees, and with internal rotation and external rotation.Marland Kitchen.  Positive empty can test, Hawkins, liftoff, tender right bicep groove.  Weakness with resisted flexion. Neuro: Normal gait. PERLA. EOMi. Alert. Oriented x3   No exam data present No results found. No results found for this or any previous visit (from the past 24 hour(s)).  Assessment/Plan: Brenda Ray is a 48 y.o. female present for OV for  Acute pain of left shoulder -Discussed options with her today.  Could possibly be shoulder nerve impingement.  Exam consistent with shoulder etiology.  Unable to reproduce the popping or clicking noise reported, which would suggest more than nerve impingement.  Speck she will need orthopedics.  She would like to try prescribed NSAIDs first.  She declined oral steroids. - naproxen (NAPROSYN) 500 MG tablet; Take 1 tablet (500 mg total) by mouth 2 (two) times daily with a meal.  Dispense: 14 tablet; Refill: 0 - meloxicam (MOBIC) 15 MG tablet; Take 1 tablet (15 mg total) by mouth daily.  Dispense: 90 tablet; Refill: 1 - F/U 4 weeks if not improved, sooner if worsening.  Call back in 3 days and reports no improvement.  Refer to  orthopedics placed.   Reviewed expectations re: course of current medical issues.  Discussed self-management of symptoms.  Outlined signs and symptoms indicating need for more acute intervention.  Patient verbalized understanding and all questions were answered.  Patient received an After-Visit Summary.  No orders of the defined types were placed in this encounter.   > 25 minutes spent with patient, >50% of time spent face to face   Note is dictated utilizing voice recognition software. Although note has been proof read prior to signing, occasional typographical errors still can be missed. If any questions arise, please do not hesitate to call for verification.   electronically signed by:  Felix Pacinienee Kuneff, DO  [facility] - OR

## 2018-08-02 ENCOUNTER — Telehealth: Payer: Self-pay | Admitting: Family Medicine

## 2018-08-02 ENCOUNTER — Encounter: Payer: Self-pay | Admitting: Family Medicine

## 2018-08-02 DIAGNOSIS — M25512 Pain in left shoulder: Secondary | ICD-10-CM | POA: Insufficient documentation

## 2018-08-02 DIAGNOSIS — G8929 Other chronic pain: Secondary | ICD-10-CM | POA: Insufficient documentation

## 2018-08-02 NOTE — Telephone Encounter (Signed)
I do not understand the message received about her citalopram, we did not discuss this medication at her last visit 07/30/2018- nor did I refill it. Please clarify? Is she asking for use to call in the 40 mg dose of celexa.  The naproxen was for shoulder pain- evaluated 3 days ago. I placed a referral to ortho since she is not seeing any improvement.

## 2018-08-02 NOTE — Telephone Encounter (Signed)
Copied from CRM [id number]. Topic: General - Other >> Aug 02, 2018  2:31 PM Gean Birchwood R wrote: Patient called in and stated her citalopram (CELEXA) 20 MG tablet she is up to 40mg . And Naproxen isnt working.  Please advise.

## 2018-08-03 NOTE — Telephone Encounter (Signed)
Patient states that starting Sunday, 08/01/2018, she began taking 40mg  of celexa due to increased anxiety.  She states that she just wanted to let you know that she has made this change.   I advised patient that due to change being made she will most likely need an appointment for further refills.  Patient states she does have about 29 days left of celexa before she will need refill.  Please advise if you would like to see her sooner?    Last RF for celexa was 06/08/18 # 135 x 1 Rf. Plan was for 6 month follow up, but there is no scheduled appt currently.   Also, patient advised of ortho referral placed.

## 2018-08-04 MED ORDER — CITALOPRAM HYDROBROMIDE 40 MG PO TABS: 40.0000 mg | ORAL_TABLET | Freq: Every day | ORAL | 0 refills | Status: DC

## 2018-08-04 NOTE — Addendum Note (Signed)
Addended by: Felix Pacini A on: 08/04/2018 04:54 PM   Modules accepted: Orders

## 2018-08-04 NOTE — Telephone Encounter (Signed)
Refilled- new bottle will be Celexa 40 mg per pill--> TAKE ONE ONLY. Follow up in 3 mos

## 2018-08-05 NOTE — Telephone Encounter (Signed)
Patient aware of RX and to schedule follow up in 3 months.

## 2018-08-06 DIAGNOSIS — M542 Cervicalgia: Secondary | ICD-10-CM | POA: Diagnosis not present

## 2018-08-06 DIAGNOSIS — M7582 Other shoulder lesions, left shoulder: Secondary | ICD-10-CM | POA: Diagnosis not present

## 2018-11-04 ENCOUNTER — Telehealth: Payer: Self-pay | Admitting: Family Medicine

## 2018-11-04 NOTE — Telephone Encounter (Signed)
Patient wants try her nedi pot before scheduling. She will call back Monday 4/13 if she still needs an appt

## 2018-11-04 NOTE — Telephone Encounter (Signed)
Copied from CRM [id number]. Topic: General - Other >> Nov 04, 2018  8:46 AM Tamela Oddi wrote: Reason for CRM: Patient called to schedule an appt. For today due to her sinus issues.  Tried to reach office but they were busy and not available.  Please call patient back to schedule an appt.  CB# [phone]

## 2018-11-10 ENCOUNTER — Encounter: Payer: Self-pay | Admitting: Family Medicine

## 2018-11-10 ENCOUNTER — Ambulatory Visit: Payer: BLUE CROSS/BLUE SHIELD

## 2018-11-10 ENCOUNTER — Ambulatory Visit (INDEPENDENT_AMBULATORY_CARE_PROVIDER_SITE_OTHER): Payer: BLUE CROSS/BLUE SHIELD | Admitting: Family Medicine

## 2018-11-10 ENCOUNTER — Other Ambulatory Visit: Payer: Self-pay

## 2018-11-10 DIAGNOSIS — J0100 Acute maxillary sinusitis, unspecified: Secondary | ICD-10-CM

## 2018-11-10 MED ORDER — CETIRIZINE HCL 10 MG PO TABS: 10.0000 mg | ORAL_TABLET | Freq: Every day | ORAL | 3 refills | Status: DC

## 2018-11-10 MED ORDER — METHYLPREDNISOLONE ACETATE 80 MG/ML IJ SUSP
80.0000 mg | Freq: Once | INTRAMUSCULAR | Status: AC
  Administered 2018-11-10: 80 mg via INTRAMUSCULAR

## 2018-11-10 MED ORDER — CEFDINIR 300 MG PO CAPS: 300.0000 mg | ORAL_CAPSULE | Freq: Two times a day (BID) | ORAL | 0 refills | Status: DC

## 2018-11-10 MED ORDER — FLUTICASONE PROPIONATE 50 MCG/ACT NA SUSP: 2.0000 | Freq: Every day | NASAL | 6 refills | Status: DC

## 2018-11-10 NOTE — Addendum Note (Signed)
Addended by: Eulah Pont on: 11/10/2018 01:21 PM   Modules accepted: Orders

## 2018-11-10 NOTE — Progress Notes (Signed)
Virtual Visit via Video   I connected with Brenda Ray on 11/10/18 at 11:20 AM EDT by a video enabled telemedicine application and verified that I am speaking with the correct person using two identifiers. Location patient: Home Location provider: [facility], Office Persons participating in the virtual visit: Patient, Dr. Claiborne Billings and R.Baker, LPN  I discussed the limitations of evaluation and management by telemedicine and the availability of in person appointments. The patient expressed understanding and agreed to proceed.  Subjective:   Chief Complaint  Patient presents with  . Ear Pain    Pt has been having sharp pains in right ear x6 days. Got OTC each ache drops and that did not give her relief. No drainage and denies fever, warm around ear. Unable to get vital signs.     HPI:  Right ear pain/sinus: Presents today via virtual visit discuss her right ear pain and sinus pressure.  She points to just anterior to her ear and states she is having a sharp pain in this location.  She is also having maxillary sinus pressure and pain, headaches, mild chills this morning, and a mild productive cough.  She has tried Zicam and Claritin without relief.  She had a sinus infection this time last year as well.   ROS: See pertinent positives and negatives per HPI.  Patient Active Problem List   Diagnosis Date Noted  . Acute pain of left shoulder 08/02/2018  . Weight loss 11/03/2017  . Benzodiazepine contract exists 10/05/2017  . Anxiety 10/02/2017  . Hot flashes due to menopause 10/02/2017  . Stress incontinence 10/01/2015    Social History   Tobacco Use  . Smoking status: Former Smoker    Packs/day: 0.50    Years: 36.00    Pack years: 18.00    Types: Cigarettes, E-cigarettes    Last attempt to quit: 02/25/2017    Years since quitting: 1.7  . Smokeless tobacco: Never Used  Substance Use Topics  . Alcohol use: Yes    Comment: rarely    Current Outpatient Medications:   .  citalopram (CELEXA) 40 MG tablet, Take 1 tablet (40 mg total) by mouth daily., Disp: 90 tablet, Rfl: 0 .  LORazepam (ATIVAN) 0.5 MG tablet, Take 1 tablet (0.5 mg total) by mouth 2 (two) times daily as needed for anxiety., Disp: 30 tablet, Rfl: 5 .  meloxicam (MOBIC) 15 MG tablet, Take 1 tablet (15 mg total) by mouth daily., Disp: 90 tablet, Rfl: 1 .  naproxen (NAPROSYN) 500 MG tablet, Take 1 tablet (500 mg total) by mouth 2 (two) times daily with a meal., Disp: 14 tablet, Rfl: 0  Allergies  Allergen Reactions  . Augmentin [Amoxicillin-Pot Clavulanate] Diarrhea  . Doxycycline Nausea And Vomiting  . Hydrocodone Itching    Objective:  LMP 10/10/2018  Gen: No acute distress. Nontoxic in appearance.  Sounds nasal congested. HENT: AT. [place].  MMM.  Eyes:Pupils Equal Round Reactive to light, Extraocular movements intact,  Conjunctiva without redness, discharge or icterus. CV: no edema Chest: Cough or shortness of breath not present Skin: no rashes, purpura or petechiae.  Neuro: Normal gait. Alert. Oriented x3  Psych: Normal affect, dress and demeanor. Normal speech. Normal thought content and judgment.  Assessment and Plan:  Brenda Ray is a 48 y.o. female present for Acute non-recurrent maxillary sinusitis Rest, hydrate.  Prescribed flonase Qd and zyrtec QHS.  - start  mucinex (DM if cough), nettie pot or nasal saline.  omnicef  prescribed, take  until completed.  IM depo medrol 80 mg by nurse visit this afternoon. If cough present it can last up to 6-8 weeks.  F/U 2 weeks of not improved.   Felix Pacinienee Christan Defranco, DO 11/10/2018

## 2018-11-10 NOTE — Patient Instructions (Signed)
Rest, hydrate.  Prescribed flonase daily and zyrtec before bed  - start  mucinex (DM if cough), nettie pot or nasal saline.  omnicef  prescribed, take until completed.  IM depo medrol 80 mg by nurse visit this afternoon. If cough present it can last up to 6-8 weeks.  F/U 2 weeks of not improved.    Sinusitis, Adult Sinusitis is soreness and swelling (inflammation) of your sinuses. Sinuses are hollow spaces in the bones around your face. They are located:  Around your eyes.  In the middle of your forehead.  Behind your nose.  In your cheekbones. Your sinuses and nasal passages are lined with a fluid called mucus. Mucus drains out of your sinuses. Swelling can trap mucus in your sinuses. This lets germs (bacteria, virus, or fungus) grow, which leads to infection. Most of the time, this condition is caused by a virus. What are the causes? This condition is caused by:  Allergies.  Asthma.  Germs.  Things that block your nose or sinuses.  Growths in the nose (nasal polyps).  Chemicals or irritants in the air.  Fungus (rare). What increases the risk? You are more likely to develop this condition if:  You have a weak body defense system (immune system).  You do a lot of swimming or diving.  You use nasal sprays too much.  You smoke. What are the signs or symptoms? The main symptoms of this condition are pain and a feeling of pressure around the sinuses. Other symptoms include:  Stuffy nose (congestion).  Runny nose (drainage).  Swelling and warmth in the sinuses.  Headache.  Toothache.  A cough that may get worse at night.  Mucus that collects in the throat or the back of the nose (postnasal drip).  Being unable to smell and taste.  Being very tired (fatigue).  A fever.  Sore throat.  Bad breath. How is this diagnosed? This condition is diagnosed based on:  Your symptoms.  Your medical history.  A physical exam.  Tests to find out if your  condition is short-term (acute) or long-term (chronic). Your doctor may: ? Check your nose for growths (polyps). ? Check your sinuses using a tool that has a light (endoscope). ? Check for allergies or germs. ? Do imaging tests, such as an MRI or CT scan. How is this treated? Treatment for this condition depends on the cause and whether it is short-term or long-term.  If caused by a virus, your symptoms should go away on their own within 10 days. You may be given medicines to relieve symptoms. They include: ? Medicines that shrink swollen tissue in the nose. ? Medicines that treat allergies (antihistamines). ? A spray that treats swelling of the nostrils. ? Rinses that help get rid of thick mucus in your nose (nasal saline washes).  If caused by bacteria, your doctor may wait to see if you will get better without treatment. You may be given antibiotic medicine if you have: ? A very bad infection. ? A weak body defense system.  If caused by growths in the nose, you may need to have surgery. Follow these instructions at home: Medicines  Take, use, or apply over-the-counter and prescription medicines only as told by your doctor. These may include nasal sprays.  If you were prescribed an antibiotic medicine, take it as told by your doctor. Do not stop taking the antibiotic even if you start to feel better. Hydrate and humidify   Drink enough water to keep your  pee (urine) pale yellow.  Use a cool mist humidifier to keep the humidity level in your home above 50%.  Breathe in steam for 10-15 minutes, 3-4 times a day, or as told by your doctor. You can do this in the bathroom while a hot shower is running.  Try not to spend time in cool or dry air. Rest  Rest as much as you can.  Sleep with your head raised (elevated).  Make sure you get enough sleep each night. General instructions   Put a warm, moist washcloth on your face 3-4 times a day, or as often as told by your doctor.  This will help with discomfort.  Wash your hands often with soap and water. If there is no soap and water, use hand sanitizer.  Do not smoke. Avoid being around people who are smoking (secondhand smoke).  Keep all follow-up visits as told by your doctor. This is important. Contact a doctor if:  You have a fever.  Your symptoms get worse.  Your symptoms do not get better within 10 days. Get help right away if:  You have a very bad headache.  You cannot stop throwing up (vomiting).  You have very bad pain or swelling around your face or eyes.  You have trouble seeing.  You feel confused.  Your neck is stiff.  You have trouble breathing. Summary  Sinusitis is swelling of your sinuses. Sinuses are hollow spaces in the bones around your face.  This condition is caused by tissues in your nose that become inflamed or swollen. This traps germs. These can lead to infection.  If you were prescribed an antibiotic medicine, take it as told by your doctor. Do not stop taking it even if you start to feel better.  Keep all follow-up visits as told by your doctor. This is important. This information is not intended to replace advice given to you by your health care provider. Make sure you discuss any questions you have with your health care provider. Document Released: 12/31/2007 Document Revised: 12/14/2017 Document Reviewed: 12/14/2017 Elsevier Interactive Patient Education  2019 [employer].

## 2018-11-29 ENCOUNTER — Other Ambulatory Visit: Payer: Self-pay | Admitting: Family Medicine

## 2018-11-29 NOTE — Telephone Encounter (Signed)
Pt was called and message was left to return call that pt was due for appt for medication refills

## 2018-11-30 ENCOUNTER — Other Ambulatory Visit: Payer: Self-pay

## 2018-11-30 ENCOUNTER — Encounter: Payer: Self-pay | Admitting: Family Medicine

## 2018-11-30 ENCOUNTER — Ambulatory Visit (INDEPENDENT_AMBULATORY_CARE_PROVIDER_SITE_OTHER): Payer: BLUE CROSS/BLUE SHIELD | Admitting: Family Medicine

## 2018-11-30 VITALS — Ht 66.0 in

## 2018-11-30 DIAGNOSIS — M25512 Pain in left shoulder: Secondary | ICD-10-CM

## 2018-11-30 DIAGNOSIS — F419 Anxiety disorder, unspecified: Secondary | ICD-10-CM | POA: Diagnosis not present

## 2018-11-30 DIAGNOSIS — N393 Stress incontinence (female) (male): Secondary | ICD-10-CM

## 2018-11-30 DIAGNOSIS — F410 Panic disorder [episodic paroxysmal anxiety] without agoraphobia: Secondary | ICD-10-CM

## 2018-11-30 DIAGNOSIS — Z79899 Other long term (current) drug therapy: Secondary | ICD-10-CM

## 2018-11-30 DIAGNOSIS — N951 Menopausal and female climacteric states: Secondary | ICD-10-CM

## 2018-11-30 MED ORDER — CITALOPRAM HYDROBROMIDE 40 MG PO TABS: 40.0000 mg | ORAL_TABLET | Freq: Every day | ORAL | 1 refills | Status: DC

## 2018-11-30 MED ORDER — MELOXICAM 15 MG PO TABS: 15.0000 mg | ORAL_TABLET | Freq: Every day | ORAL | 1 refills | Status: DC

## 2018-11-30 MED ORDER — LORAZEPAM 0.5 MG PO TABS: 0.5000 mg | ORAL_TABLET | Freq: Two times a day (BID) | ORAL | 5 refills | Status: DC | PRN

## 2018-11-30 NOTE — Progress Notes (Signed)
VIRTUAL VISIT VIA VIDEO  I connected with Brenda Ray on 11/30/18 at  9:20 AM EDT by a video enabled telemedicine application and verified that I am speaking with the correct person using two identifiers. Location patient: Home Location provider: [facility], Office Persons participating in the virtual visit: Patient, Dr. Claiborne Billings and R.Baker, LPN  I discussed the limitations of evaluation and management by telemedicine and the availability of in person appointments. The patient expressed understanding and agreed to proceed.   SUBJECTIVE Chief Complaint  Patient presents with  . Anxiety    Pt states she is doing a lot better on medication with no complaints. Needs refills.     HPI:  Anxiety/hot flashes:  Pt reports she is doing really great on current regimen of celexa 40 mg Qd and ativan BID prn. Benzo contract in place. She reports she uses an ativan about 1-2 x a week.  Indication for chronic controlled substance: Anxiety Medication and dose: 0.5 mg of Ativan twice daily as needed # pills per month: 30 Last UDS date: Collect next visit Pain contract signed (Y/N): Yes Date narcotic database last reviewed (include red flags):11/30/2018  Incontinence/urinary: Patient reports her urinary incontinence continues.  She states it is pretty much when she squats down, bends over, coughs or laughs.  She would like to pursue further evaluation and possible treatment for her condition.  Shoulder pain: Patient reports her pain is tolerable and she is using Mobic 15 mg daily.   ROS: See pertinent positives and negatives per HPI.  Patient Active Problem List   Diagnosis Date Noted  . Acute pain of left shoulder 08/02/2018  . Weight loss 11/03/2017  . Benzodiazepine contract exists 10/05/2017  . Anxiety 10/02/2017  . Hot flashes due to menopause 10/02/2017  . Stress incontinence 10/01/2015    Social History   Tobacco Use  . Smoking status: Former Smoker    Packs/day: 0.50     Years: 36.00    Pack years: 18.00    Types: Cigarettes, E-cigarettes    Last attempt to quit: 02/25/2017    Years since quitting: 1.7  . Smokeless tobacco: Never Used  Substance Use Topics  . Alcohol use: Yes    Comment: rarely    Current Outpatient Medications:  .  cetirizine (ZYRTEC) 10 MG tablet, Take 1 tablet (10 mg total) by mouth at bedtime., Disp: 90 tablet, Rfl: 3 .  citalopram (CELEXA) 40 MG tablet, Take 1 tablet (40 mg total) by mouth daily., Disp: 90 tablet, Rfl: 1 .  fluticasone (FLONASE) 50 MCG/ACT nasal spray, Place 2 sprays into both nostrils daily., Disp: 16 g, Rfl: 6 .  LORazepam (ATIVAN) 0.5 MG tablet, Take 1 tablet (0.5 mg total) by mouth 2 (two) times daily as needed for anxiety., Disp: 30 tablet, Rfl: 5 .  meloxicam (MOBIC) 15 MG tablet, Take 1 tablet (15 mg total) by mouth daily., Disp: 90 tablet, Rfl: 1  Allergies  Allergen Reactions  . Augmentin [Amoxicillin-Pot Clavulanate] Diarrhea  . Doxycycline Nausea And Vomiting  . Hydrocodone Itching    OBJECTIVE: Ht 5\' 6"  (1.676 m)   LMP 09/27/2018   BMI 26.15 kg/m  Gen: No acute distress. Nontoxic in appearance.  HENT: AT. .  MMM.  Eyes:Pupils Equal Round Reactive to light, Extraocular movements intact,  Conjunctiva without redness, discharge or icterus. CV: No edema Chest: Cough or shortness of breath not present Neuro:  Normal gait. Alert. Oriented x3  Psych: Normal affect, dress and demeanor. Normal speech.  Normal thought content and judgment. Depression screen [facility] 2/9 06/08/2018 11/03/2017 10/05/2017 10/02/2017 09/04/2017  Decreased Interest 0 0 1 2 0  Down, Depressed, Hopeless 0 0 0 1 0  PHQ - 2 Score 0 0 1 3 0  Altered sleeping - - 1 1 -  Tired, decreased energy - - 1 2 -  Change in appetite - - 1 0 -  Feeling bad or failure about yourself  - - 0 0 -  Trouble concentrating - - 0 1 -  Moving slowly or fidgety/restless - - 0 0 -  Suicidal thoughts - - 0 0 -  PHQ-9 Score - - 4 7 -   GAD 7 : Generalized  Anxiety Score 11/30/2018 06/08/2018 10/05/2017 10/02/2017  Nervous, Anxious, on Edge 0 0 2 3  Control/stop worrying 0 0 3 3  Worry too much - different things 0 0 3 3  Trouble relaxing 0 0 1 2  Restless 0 0 3 3  Easily annoyed or irritable 0 0 1 3  Afraid - awful might happen 0 0 0 3  Total GAD 7 Score 0 0 13 20  Anxiety Difficulty Not difficult at all Not difficult at all Not difficult at all -      ASSESSMENT AND PLAN: Brenda Ray is a 48 y.o. female present for  Anxiety/Hot flashes due to menopauseBenzodiazepine contract exists/panic -Able.  Doing very well with anxiety and hot flashes. - refills provided on celexa 40 mg QD and ativan 0.5 mg BID PRN. - [facility] database reviewed 11/30/18 - f/u 6 mos.   Acute pain of left shoulder Stable.  Refills on Mobic 15 mg daily. - meloxicam (MOBIC) 15 MG tablet; Take 1 tablet (15 mg total) by mouth daily.  Dispense: 90 tablet; Refill: 1   Stress incontinence Worsening.  Discussed options with her today including gynecological evaluation.  Briefly discussed different types of incontinence and treatments thereof.  She is agreeable to gynecology referral. - Ambulatory referral to Gynecology  6 months on chronic conditions.  > 25 minutes spent with patient, >50% of time spent face to face counseling and coordinating care.    Brenda Pacinienee Willeen Novak, DO 11/30/2018

## 2018-12-01 ENCOUNTER — Encounter: Payer: Self-pay | Admitting: Family Medicine

## 2018-12-10 ENCOUNTER — Other Ambulatory Visit: Payer: Self-pay

## 2018-12-10 ENCOUNTER — Encounter: Payer: Self-pay | Admitting: Obstetrics and Gynecology

## 2018-12-10 ENCOUNTER — Ambulatory Visit: Payer: BLUE CROSS/BLUE SHIELD | Admitting: Obstetrics and Gynecology

## 2018-12-10 VITALS — BP 106/80 | HR 76 | Temp 97.9°F | Ht 65.0 in | Wt 172.2 lb

## 2018-12-10 DIAGNOSIS — R32 Unspecified urinary incontinence: Secondary | ICD-10-CM | POA: Diagnosis not present

## 2018-12-10 DIAGNOSIS — N8110 Cystocele, unspecified: Secondary | ICD-10-CM

## 2018-12-10 DIAGNOSIS — R3129 Other microscopic hematuria: Secondary | ICD-10-CM | POA: Diagnosis not present

## 2018-12-10 DIAGNOSIS — N3946 Mixed incontinence: Secondary | ICD-10-CM

## 2018-12-10 LAB — POCT URINALYSIS DIPSTICK
Bilirubin, UA: NEGATIVE
Glucose, UA: NEGATIVE
Ketones, UA: NEGATIVE
Leukocytes, UA: NEGATIVE
Nitrite, UA: NEGATIVE
Protein, UA: NEGATIVE
Urobilinogen, UA: 0.2 E.U./dL
pH, UA: 5 (ref 5.0–8.0)

## 2018-12-10 NOTE — Progress Notes (Signed)
48 y.o. G72P2020 Divorced Caucasian female here for new patient exam.   She has urinary incontinence.   Leakage for 2 -3 years.  Leaks with stepping a certain way, laughing, coughing, and exercise.  Some unprovoked leakage.  Void every 30 minutes during the day.  NF - maybe once.  No enuresis.   No hx UTIs, renal stones.   Bowel function is regular - twice a day.  Does perirectal splinting once a week.  Hx hemorrhoids.   Has a sensation of something bulging with wiping.  No problems with sexual functioning.   Did a lot of heavy lifting.   Some hot flashes.  No menses since March.   7 pounds, 11 ounces, and 8 pounds, 3 ounces.  Had vacuum deliveries with each delivery.   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  Enjoys her family.   Urine dip - mod blood.  PCP: Felix Pacini      Patient's last menstrual period was 10/05/2018 (approximate).           Sexually active: Yes.   Female partner.  The current method of family planning is none.    Exercising: Yes.    walking Smoker:  former  Health Maintenance: Pap:  10/01/15 Neg. HR HPV:neg  History of abnormal Pap:  Yes, Cryo  MMG:  07/07/17 [place] Left BIRADS2:Benign. F/u 1 year  Colonoscopy: 1998 - polyps  BMD: never TDaP:  2012 HIV: done Hep C: done Screening Labs: PCP   reports that she quit smoking about 21 months ago. Her smoking use included cigarettes and e-cigarettes. She has a 18.00 pack-year smoking history. She has quit using smokeless tobacco. She reports previous alcohol use. She reports that she does not use drugs.  Past Medical History:  Diagnosis Date  . Abnormal Pap smear of cervix    age 78  . Anemia   . Anxiety    use to use / take Lorazepam ., last episode of panic  2 yrs. ago  . Arthritis    hands, knees, back - Osteo  . Colon polyps    she reports colon polyps about 20 years ago; she was told 5 yr follow up.   . Complication of anesthesia    woke up before the tube was removed   . Cystitis   .  Facial nerve disorder   . GERD (gastroesophageal reflux disease)   . Hiatal hernia    reports seen on [facility] in her 20s [facility]. Completed for pain and FOBT +.   . History of chicken pox   . Pneumonia    as a child- hosp.   . Vaginal delivery    x2    Past Surgical History:  Procedure Laterality Date  . ANTERIOR CERVICAL DECOMP/DISCECTOMY FUSION  2017   C3-C5  . BREAST BIOPSY Left 2014   fatty tissue only  . CERVICAL DISCECTOMY  2014  . CHOLECYSTECTOMY N/A 06/16/2014   Procedure: LAPAROSCOPIC CHOLECYSTECTOMY ;  Surgeon: Abigail Miyamoto, MD;  Location: [facility] OR;  Service: General;  Laterality: N/A;  laparoscopic cholecystectomy  . GYNECOLOGIC CRYOSURGERY     age 76  . LAPAROSCOPIC ABDOMINAL EXPLORATION     for endometrimosis     Current Outpatient Medications  Medication Sig Dispense Refill  . cetirizine (ZYRTEC) 10 MG tablet Take 1 tablet (10 mg total) by mouth at bedtime. 90 tablet 3  . citalopram (CELEXA) 40 MG tablet Take 1 tablet (40 mg total) by mouth daily. 90 tablet 1  .  fluticasone (FLONASE) 50 MCG/ACT nasal spray Place 2 sprays into both nostrils daily. 16 g 6  . LORazepam (ATIVAN) 0.5 MG tablet Take 1 tablet (0.5 mg total) by mouth 2 (two) times daily as needed for anxiety. 30 tablet 5  . meloxicam (MOBIC) 15 MG tablet Take 1 tablet (15 mg total) by mouth daily. 90 tablet 1   No current facility-administered medications for this visit.     Family History  Problem Relation Age of Onset  . Arthritis Mother   . Hyperlipidemia Mother   . Hypertension Mother   . Thyroid disease Mother        hypothyroid  . Arthritis Father   . Hyperlipidemia Father   . Hypertension Father   . Bladder Cancer Father   . Prostate cancer Father   . Thyroid disease Brother   . Arthritis Maternal Grandmother   . Arthritis Maternal Grandfather   . Arthritis Paternal Grandmother   . Arthritis Paternal Grandfather   . Breast cancer Maternal Aunt     Review of Systems   All other systems reviewed and are negative.   Exam:   BP 106/80   Pulse 76   Temp 97.9 F (36.6 C) (Oral)   Ht 5\' 5"  (1.651 m)   Wt 172 lb 3.2 oz (78.1 kg)   LMP 10/05/2018 (Approximate)   BMI 28.66 kg/m     General appearance: alert, cooperative and appears stated age Head: Normocephalic, without obvious abnormality, atraumatic Abdomen: soft, non-tender; no masses, no organomegaly  Pelvic: External genitalia:  no lesions              Urethra:  normal appearing urethra with no masses, tenderness or lesions              Bartholins and Skenes: normal                 Vagina: normal appearing vagina with normal color and discharge, no lesions.  First degree cystocele, minimal rectocele.              Cervix: no lesions              Bimanual Exam:  Uterus:  normal size, contour, position, consistency, mobility, non-tender              Adnexa: no mass, fullness, tenderness              Rectal exam: Yes.  .  Confirms.              Anus:  Decreased sphincter tone, no lesions  Chaperone was present for exam.  Assessment:   Mixed incontinence. Cystocele.  First degree.  Microscopic hematuria.    Plan:   I have had a comprehensive discussion with the patient regarding prolapse and urinary incontinence - risk factors as well as treatment options.  Medical treatments may include physical therapy, pessary use, Impressa, and potential medication for overactive bladder.  We discussed surgical care options for anterior colporrhaphy, TVT Exact midurethral sling and cystoscopy.  We discussed benefits and risks of surgery which include but are not limited to bleeding, infection, damage to surrounding organs, permanent mesh use which may cause erosion and exposure in the vagina, urethra, bladder or ureters, slower voiding and urinary retention, overactive bladder symptoms, reoperation, recurrence of prolapse and incontinence,  DVT, PE, death, and reaction to anesthesia.    I have discussed  surgical expectations regarding the procedures and recovery.     I provided reading materials from ACOG  regarding incontinence as well.  I recommended urodynamic testing prior to doing surgery.   She wants to consider her options at this time and may return for further discussion if needed.    Urine micro and culture sent as her urine dip was noted to have microscopic hematuria.   ____45___ minutes face to face time of which over 50% was spent in counseling.    After visit summary provided.

## 2018-12-12 LAB — URINE CULTURE

## 2018-12-13 ENCOUNTER — Telehealth: Payer: Self-pay | Admitting: *Deleted

## 2018-12-13 ENCOUNTER — Other Ambulatory Visit: Payer: Self-pay | Admitting: *Deleted

## 2018-12-13 LAB — URINALYSIS, MICROSCOPIC ONLY
Bacteria, UA: NONE SEEN
Casts: NONE SEEN /lpf
WBC, UA: NONE SEEN /hpf (ref 0–5)

## 2018-12-13 MED ORDER — SULFAMETHOXAZOLE-TRIMETHOPRIM 800-160 MG PO TABS: 1.0000 | ORAL_TABLET | Freq: Two times a day (BID) | ORAL | 0 refills | Status: AC

## 2018-12-13 MED ORDER — FLUCONAZOLE 150 MG PO TABS: 150.0000 mg | ORAL_TABLET | Freq: Once | ORAL | 0 refills | Status: AC

## 2018-12-13 NOTE — Telephone Encounter (Signed)
Ok for Diflucan 150 mg po x 1.  May repeat in 72 hours prn.  Disp:  2 RF: none

## 2018-12-13 NOTE — Telephone Encounter (Signed)
Spoke with patient. Advised Diflucan 150 mg po x 1 repeat in 72 hours if symptoms persist #2 0RF sent to pharmacy on file. Encounter closed.

## 2018-12-13 NOTE — Telephone Encounter (Signed)
Notes recorded by Leda Min, RN on 12/13/2018 at 5:07 PM EDT Spoke with patient, advised as seen below per Dr. Edward Jolly. [facility]. Patient requesting Rx for diflucan. States anytime she requires abx she needs diflucan. Advised I will review with provider and return call with recommendations. Patient verbalizes understanding and is agreeable. See telephone encounter dated 12/13/18 to review with provider.    Dr. Edward Jolly -please advise on diflucan.

## 2019-01-20 ENCOUNTER — Telehealth: Payer: Self-pay | Admitting: Family Medicine

## 2019-01-20 NOTE — Telephone Encounter (Signed)
Patient having panic attacks from wearing a mask. Patient is requesting note for her job that she is excused from wearing a mask at work. Patient requesting the note to be emailed to [email or web address].

## 2019-01-20 NOTE — Telephone Encounter (Signed)
Please advise 

## 2019-01-21 NOTE — Telephone Encounter (Signed)
Pt was called and she said that would be fine and she would like the letter. Pt was told we would call when ready for pick up as it could not be emailed. Pt agreed to pick up when ready

## 2019-01-21 NOTE — Telephone Encounter (Signed)
Please advise patient I am willing to write an excuse stating she can not wear a surgical mask due to her panic, however I will not exempt her from  wearing some type of coverage of her nose/mouth while in public.  - She will need to to work with her employer to find an acceptable alternative to keep the public and her safe during the pandemic. Please advise on her decision.

## 2019-01-24 NOTE — Telephone Encounter (Signed)
Letter written and printed.

## 2019-04-15 ENCOUNTER — Other Ambulatory Visit: Payer: Self-pay | Admitting: Family Medicine

## 2019-04-15 ENCOUNTER — Telehealth: Payer: Self-pay | Admitting: Family Medicine

## 2019-04-15 DIAGNOSIS — Z1239 Encounter for other screening for malignant neoplasm of breast: Secondary | ICD-10-CM

## 2019-04-15 DIAGNOSIS — Z1231 Encounter for screening mammogram for malignant neoplasm of breast: Secondary | ICD-10-CM

## 2019-04-15 NOTE — Telephone Encounter (Signed)
Patient needs orders entered for diagnostic mammogram since she has a piece of titanium in her left breast. Please put in for [facility] in [place].

## 2019-04-15 NOTE — Telephone Encounter (Signed)
[facility] to [facility] for [facility] mammogram. She states Dr Raoul Pitch is always the one to place the order for this not her GYN.   Sent to Dr Raoul Pitch to advise. Pt was told Dr Raoul Pitch was not here today and this would be addressed next week. Pt verbalized understanding

## 2019-04-18 NOTE — Telephone Encounter (Signed)
Pt was called and message was left letting pt know order was in, okay per DPR.

## 2019-04-18 NOTE — Telephone Encounter (Signed)
Mam order sent

## 2019-04-18 NOTE — Addendum Note (Signed)
Addended by: Howard Pouch A on: 04/18/2019 06:59 AM   Modules accepted: Orders

## 2019-04-20 ENCOUNTER — Telehealth: Payer: Self-pay | Admitting: Family Medicine

## 2019-04-20 NOTE — Telephone Encounter (Signed)
In order to place the diagnostic order, it requires an exam and reported findings. Please schedule asap for exam so we can get this completed for her.

## 2019-04-20 NOTE — Telephone Encounter (Signed)
Pt was called and scheduled for in person visit for Friday

## 2019-04-20 NOTE — Telephone Encounter (Signed)
Breast center told patient she needs order for Diagnostic mammogram. Brenda Ray stated she has a lump right under her left nipple.  If any questions, patient can be reached at [phone]  Thank you

## 2019-04-20 NOTE — Telephone Encounter (Signed)
Pt told breast center about lump under breast, tender to touch per patient, does not drain, not warm to touch. Breast center said she would have to have a diagnostic mammogram. Pt was told she might have to have OV in order for Dr Raoul Pitch to evaluate and to order.   Please advise

## 2019-04-22 ENCOUNTER — Ambulatory Visit: Payer: BC Managed Care – PPO | Admitting: Family Medicine

## 2019-04-22 ENCOUNTER — Encounter: Payer: Self-pay | Admitting: Family Medicine

## 2019-04-22 ENCOUNTER — Other Ambulatory Visit: Payer: Self-pay

## 2019-04-22 VITALS — BP 124/72 | HR 64 | Temp 98.0°F | Resp 16 | Ht 65.0 in | Wt 177.5 lb

## 2019-04-22 DIAGNOSIS — N63 Unspecified lump in unspecified breast: Secondary | ICD-10-CM | POA: Diagnosis not present

## 2019-04-22 NOTE — Patient Instructions (Signed)
I ordered the diagnostic mammogram. They will call you to schedule.  As soon as I get the reports, we will call you.

## 2019-04-22 NOTE — Progress Notes (Signed)
Brenda Ray , [birth year], 48 y.o., female MRN: [id number] Patient Care Team    Relationship Specialty Notifications Start End  Natalia Leatherwood, DO PCP - General Family Medicine  05/01/15     Chief Complaint  Patient presents with  . Breast Pain    Pt went to get mammogram and said she needed diagnostic mammogram. Left breast pain x2 weeks      Subjective: Pt presents for an OV with complaints of left breast pain.  She states he is has experienced breast tenderness of her left breast just inferior to her areola.  She feels there was some skin changes over this area and burning sensation, but denies any redness.  She denies any drainage or leakage from her nipple.  She does endorse feeling a mass and tenderness at this area.  Family history of breast cancer in her maternal aunt in her 22s. In 2018 patient needed additional images and ultrasound of her left breast, which showed multiple cysts accounting for the changes seen on mammography-located in the left outer breast.  There is a titanium clip at this place.  Annual screening was recommended.  Patient states she was due in January for her annual screening but had been putting it off and then the coronavirus pandemic occurred.  Depression screen [facility] 2/9 06/08/2018 11/03/2017 10/05/2017 10/02/2017 09/04/2017  Decreased Interest 0 0 1 2 0  Down, Depressed, Hopeless 0 0 0 1 0  PHQ - 2 Score 0 0 1 3 0  Altered sleeping - - 1 1 -  Tired, decreased energy - - 1 2 -  Change in appetite - - 1 0 -  Feeling bad or failure about yourself  - - 0 0 -  Trouble concentrating - - 0 1 -  Moving slowly or fidgety/restless - - 0 0 -  Suicidal thoughts - - 0 0 -  PHQ-9 Score - - 4 7 -    Allergies  Allergen Reactions  . Augmentin [Amoxicillin-Pot Clavulanate] Diarrhea  . Doxycycline Nausea And Vomiting  . Hydrocodone Itching   Social History   Social History Narrative   Lives with Son and significant other. Engaged to be married.   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 No drugs. Wears her seatbelt.    Does not exercise daily.    Firearms present in the home in a gun safe.   Smoke alarm in the home.    Feel safe in relationships.    Past Medical History:  Diagnosis Date  . Abnormal Pap smear of cervix    age 41  . Anemia   . Anxiety    use to use / take Lorazepam ., last episode of panic  2 yrs. ago  . Arthritis    hands, knees, back - Osteo  . Colon polyps    she reports colon polyps about 20 years ago; she was told 5 yr follow up.   . Complication of anesthesia    woke up before the tube was removed   . Cystitis   . Facial nerve disorder   . GERD (gastroesophageal reflux disease)   . Hiatal hernia    reports seen on [facility]GD completed in her 20s [facility]. Completed for pain and FOBT +.   . History of chicken pox   . Pneumonia    as a child- hosp.   . Vaginal delivery    x2   Past  Surgical History:  Procedure Laterality Date  . ANTERIOR CERVICAL DECOMP/DISCECTOMY FUSION  2017   C3-C5  . BREAST BIOPSY Left 2014   fatty tissue only  . CERVICAL DISCECTOMY  2014  . CHOLECYSTECTOMY N/A 06/16/2014   Procedure: LAPAROSCOPIC CHOLECYSTECTOMY ;  Surgeon: Coralie Keens, MD;  Location: [place];  Service: General;  Laterality: N/A;  laparoscopic cholecystectomy  . GYNECOLOGIC CRYOSURGERY     age 1  . LAPAROSCOPIC ABDOMINAL EXPLORATION     for endometrimosis    Family History  Problem Relation Age of Onset  . Arthritis Mother   . Hyperlipidemia Mother   . Hypertension Mother   . Thyroid disease Mother        hypothyroid  . Arthritis Father   . Hyperlipidemia Father   . Hypertension Father   . Bladder Cancer Father   . Prostate cancer Father   . Thyroid disease Brother   . Arthritis Maternal Grandmother   . Arthritis Maternal Grandfather   . Arthritis Paternal Grandmother   . Arthritis Paternal Grandfather   . Breast cancer Maternal Aunt 50   Allergies as of 04/22/2019       Reactions   Augmentin [amoxicillin-pot Clavulanate] Diarrhea   Doxycycline Nausea And Vomiting   Hydrocodone Itching      Medication List       Accurate as of April 22, 2019 11:59 PM. If you have any questions, ask your nurse or doctor.        cetirizine 10 MG tablet Commonly known as: ZYRTEC Take 1 tablet (10 mg total) by mouth at bedtime.   citalopram 40 MG tablet Commonly known as: CELEXA Take 1 tablet (40 mg total) by mouth daily.   fluticasone 50 MCG/ACT nasal spray Commonly known as: FLONASE Place 2 sprays into both nostrils daily.   LORazepam 0.5 MG tablet Commonly known as: ATIVAN Take 1 tablet (0.5 mg total) by mouth 2 (two) times daily as needed for anxiety.   meloxicam 15 MG tablet Commonly known as: MOBIC Take 1 tablet (15 mg total) by mouth daily.       All past medical history, surgical history, allergies, family history, immunizations andmedications were updated in the EMR today and reviewed under the history and medication portions of their EMR.     ROS: Negative, with the exception of above mentioned in HPI   Objective:  BP 124/72 (BP Location: Left Arm, Patient Position: Sitting, Cuff Size: Normal)   Pulse 64   Temp 98 F (36.7 C) (Temporal)   Resp 16   Ht 5\' 5"  (1.651 m)   Wt 177 lb 8 oz (80.5 kg)   LMP 04/17/2019 (Exact Date)   SpO2 99%   BMI 29.54 kg/m  Body mass index is 29.54 kg/m. Gen: Afebrile. No acute distress. Nontoxic in appearance, well developed, well nourished.  HENT: AT. [place].  Eyes:Pupils Equal Round Reactive to light, Extraocular movements intact,  Conjunctiva without redness, discharge or icterus. Neck/lymp/endocrine: Supple, no lymphadenopathy Breasts:  right breast normal without mass, skin or nipple changes or axillary nodes.  Left breast with mild hyperpigmentation noted underneath left areola.  No erythema.  No other skin changes noted.  Small pea-sized lump with tenderness located at the 6 o'clock position just  inferior to areola and above breast fold.   No exam data present No results found. No results found for this or any previous visit (from the past 24 hour(s)).  Assessment/Plan: JAHMIYAH DULLEA is a 48 y.o. female present for OV for  Breast mass Diagnostic mammogram ordered along with ultrasound.  Area of concern is not in the same location as prior titanium clip/known cysts. - US BREAST LTD UNI LEFT INC AXILLA; Future - MM DIAG BREAST TOMO BILATERAL; Future Follow-up PRN   Reviewed expectations re: course of current medical issues.  Discussed self-management of symptoms.  Outlined signs and symptoms indicating need for more acute intervention.  Patient verbalized understanding and all questions were answered.  Patient received an After-Visit Summary.    Orders Placed This Encounter  Procedures  . US BREAST LTD UNI LEFT INC AXILLA  . MM DIAG BREAST TOMO BILATERAL    > 25 minutes spent with patient, >50% of time spent face to face    Note is dictated utilizing voice recognition software. Although note has been proof read prior to signing, occasional typographical errors still can be missed. If any questions arise, please do not hesitate to call for verification.   electronically signed by:  Felix Pacinienee Kuneff, DO  [facility] - OR

## 2019-04-24 ENCOUNTER — Encounter: Payer: Self-pay | Admitting: Family Medicine

## 2019-05-02 ENCOUNTER — Ambulatory Visit
Admission: RE | Admit: 2019-05-02 | Discharge: 2019-05-02 | Disposition: A | Discharge status: Home / Self Care | Payer: BC Managed Care – PPO | Source: Ambulatory Visit | Attending: Family Medicine | Admitting: Family Medicine

## 2019-05-02 ENCOUNTER — Other Ambulatory Visit: Payer: Self-pay

## 2019-05-02 DIAGNOSIS — N6012 Diffuse cystic mastopathy of left breast: Secondary | ICD-10-CM | POA: Diagnosis not present

## 2019-05-02 DIAGNOSIS — N6011 Diffuse cystic mastopathy of right breast: Secondary | ICD-10-CM | POA: Diagnosis not present

## 2019-05-02 DIAGNOSIS — N63 Unspecified lump in unspecified breast: Secondary | ICD-10-CM

## 2019-05-02 DIAGNOSIS — N644 Mastodynia: Secondary | ICD-10-CM | POA: Diagnosis not present

## 2019-05-03 ENCOUNTER — Telehealth: Payer: Self-pay | Admitting: Family Medicine

## 2019-05-03 MED ORDER — CLOTRIMAZOLE 1 % EX CREA: 1.0000 "application " | TOPICAL_CREAM | Freq: Two times a day (BID) | CUTANEOUS | 0 refills | Status: DC

## 2019-05-03 NOTE — Telephone Encounter (Signed)
Please inform patient the following information: I believe the radiologist already discussed findings with her. Her diagnostic mammogram for left breast pain was normal with a cyst near the site of discomfort, but not exactly at site.   - if she is still have the burning skin sensation, then I would like her to try a cream I have called in twice a day. Use at least 1 week after sensation resolves (Ex: if it goes  away in a week- use cream 2 weeks). If in 4 weeks of use it has not resolved, stop cream and follow up.  I would also encourage her to consider her undergarment/bra as a potential source of her discomfort.

## 2019-05-03 NOTE — Telephone Encounter (Signed)
Pt was called and given information, she verbalized understanding  

## 2019-05-03 NOTE — Telephone Encounter (Signed)
Pt was called and message was left to return call  

## 2019-06-20 DIAGNOSIS — M25572 Pain in left ankle and joints of left foot: Secondary | ICD-10-CM | POA: Diagnosis not present

## 2019-06-28 ENCOUNTER — Telehealth: Payer: Self-pay | Admitting: Family Medicine

## 2019-06-28 NOTE — Telephone Encounter (Signed)
Patient refill request. Patient could not remember when she needed to schedule follow up appt. Please advise.    citalopram (CELEXA) 40 MG tablet [[id number]  [facility]

## 2019-06-28 NOTE — Telephone Encounter (Signed)
Pt was due last month for her [place]. Pt was called and told to call back to get on the schedule as she is overdue for her 6 month CMC. Pt was advised this could be a virtual visit

## 2019-06-28 NOTE — Telephone Encounter (Signed)
Appt was made for 06/29/2019

## 2019-06-29 ENCOUNTER — Other Ambulatory Visit: Payer: Self-pay

## 2019-06-29 ENCOUNTER — Ambulatory Visit (INDEPENDENT_AMBULATORY_CARE_PROVIDER_SITE_OTHER): Payer: BC Managed Care – PPO | Admitting: Family Medicine

## 2019-06-29 ENCOUNTER — Encounter: Payer: Self-pay | Admitting: Family Medicine

## 2019-06-29 VITALS — Temp 98.7°F | Ht 65.0 in

## 2019-06-29 DIAGNOSIS — Z79899 Other long term (current) drug therapy: Secondary | ICD-10-CM | POA: Diagnosis not present

## 2019-06-29 DIAGNOSIS — F410 Panic disorder [episodic paroxysmal anxiety] without agoraphobia: Secondary | ICD-10-CM | POA: Diagnosis not present

## 2019-06-29 DIAGNOSIS — N951 Menopausal and female climacteric states: Secondary | ICD-10-CM

## 2019-06-29 DIAGNOSIS — F419 Anxiety disorder, unspecified: Secondary | ICD-10-CM | POA: Diagnosis not present

## 2019-06-29 DIAGNOSIS — M25512 Pain in left shoulder: Secondary | ICD-10-CM

## 2019-06-29 MED ORDER — LORAZEPAM 0.5 MG PO TABS: 0.5000 mg | ORAL_TABLET | Freq: Two times a day (BID) | ORAL | 2 refills | Status: DC | PRN

## 2019-06-29 MED ORDER — MELOXICAM 15 MG PO TABS: 15.0000 mg | ORAL_TABLET | Freq: Every day | ORAL | 1 refills | Status: DC

## 2019-06-29 MED ORDER — CITALOPRAM HYDROBROMIDE 40 MG PO TABS: 40.0000 mg | ORAL_TABLET | Freq: Every day | ORAL | 1 refills | Status: DC

## 2019-06-29 MED ORDER — CETIRIZINE HCL 10 MG PO TABS: 10.0000 mg | ORAL_TABLET | Freq: Every day | ORAL | 3 refills | Status: DC

## 2019-06-29 NOTE — Progress Notes (Signed)
VIRTUAL VISIT VIA VIDEO  I connected with [name] on 06/29/19 at  3:30 PM EST by a video enabled telemedicine application and verified that I am speaking with the correct person using two identifiers. Location patient: Home Location provider: [facility], Office Persons participating in the virtual visit: Patient, Dr. Raoul Pitch and R.Baker, LPN  I discussed the limitations of evaluation and management by telemedicine and the availability of in person appointments. The patient expressed understanding and agreed to proceed.  Interactive audio and video telecommunications were attempted between this provider and patient, however failed, due to patient having technical difficulties OR patient did not have access to video capability. We continued and completed visit with audio only.   SUBJECTIVE Chief Complaint  Patient presents with  . Anxiety    Refill on medications     HPI: Brenda Ray is a 48 y.o. female present for [facility] follow up.  Anxiety/hot flashes:  Pt reports she is doing well  on current regimen of celexa 40 mg Qd and ativan BID prn. Benzo contract in place. She reports she uses an ativan about 1-2 x a week still. Last refill was about 6 mos ago.   Indication for chronic controlled substance: Anxiety Medication and dose: 0.5 mg of Ativan twice daily as needed # pills per month: 30 Last UDS date: Collect next visit Pain contract signed (Y/N): Yes Date narcotic database last reviewed (include red flags):06/29/19   Shoulder pain: Pt reports the mobic is helping a great deal.   ROS: See pertinent positives and negatives per HPI.  Patient Active Problem List   Diagnosis Date Noted  . Acute pain of left shoulder 08/02/2018  . Benzodiazepine contract exists 10/05/2017  . Anxiety 10/02/2017  . Hot flashes due to menopause 10/02/2017  . Stress incontinence 10/01/2015    Social History   Tobacco Use  . Smoking status: Former Smoker    Packs/day: 0.50     Years: 36.00    Pack years: 18.00    Types: Cigarettes, E-cigarettes    Quit date: 02/25/2017    Years since quitting: 2.3  . Smokeless tobacco: Former Network engineer Use Topics  . Alcohol use: Not Currently    Current Outpatient Medications:  .  cetirizine (ZYRTEC) 10 MG tablet, Take 1 tablet (10 mg total) by mouth at bedtime., Disp: 90 tablet, Rfl: 3 .  citalopram (CELEXA) 40 MG tablet, Take 1 tablet (40 mg total) by mouth daily., Disp: 90 tablet, Rfl: 1 .  fluticasone (FLONASE) 50 MCG/ACT nasal spray, Place 2 sprays into both nostrils daily., Disp: 16 g, Rfl: 6 .  LORazepam (ATIVAN) 0.5 MG tablet, Take 1 tablet (0.5 mg total) by mouth 2 (two) times daily as needed for anxiety., Disp: 30 tablet, Rfl: 5 .  meloxicam (MOBIC) 15 MG tablet, Take 1 tablet (15 mg total) by mouth daily., Disp: 90 tablet, Rfl: 1  Allergies  Allergen Reactions  . Augmentin [Amoxicillin-Pot Clavulanate] Diarrhea  . Doxycycline Nausea And Vomiting  . Hydrocodone Itching    OBJECTIVE: Temp 98.7 F (37.1 C) (Temporal)   Ht 5\' 5"  (1.651 m)   BMI 29.54 kg/m  Gen: Afebrile. No acute distress.  HENT: AT. [place].  Chest: no cough or shortness of breath Neuro:  Alert. Oriented x3  Psych: Normal affect, dress and demeanor. Normal speech. Normal thought content and judgment.   Depression screen [facility] 2/9 06/29/2019 06/08/2018 11/03/2017 10/05/2017 10/02/2017  Decreased Interest 0 0 0 1 2  Down, Depressed,  Hopeless 0 0 0 0 1  PHQ - 2 Score 0 0 0 1 3  Altered sleeping - - - 1 1  Tired, decreased energy - - - 1 2  Change in appetite - - - 1 0  Feeling bad or failure about yourself  - - - 0 0  Trouble concentrating - - - 0 1  Moving slowly or fidgety/restless - - - 0 0  Suicidal thoughts - - - 0 0  PHQ-9 Score - - - 4 7   GAD 7 : Generalized Anxiety Score 06/29/2019 11/30/2018 06/08/2018 10/05/2017  Nervous, Anxious, on Edge 0 0 0 2  Control/stop worrying 0 0 0 3  Worry too much - different things 0 0 0 3  Trouble  relaxing 0 0 0 1  Restless 0 0 0 3  Easily annoyed or irritable 0 0 0 1  Afraid - awful might happen 0 0 0 0  Total GAD 7 Score 0 0 0 13  Anxiety Difficulty Not difficult at all Not difficult at all Not difficult at all Not difficult at all    ASSESSMENT AND PLAN: Brenda Ray is a 48 y.o. female present for  Anxiety/Hot flashes due to menopauseBenzodiazepine contract exists/panic - Stable.  Doing very well with anxiety and hot flashes. - refills  provided on celexa 40 mg QD and ativan 0.5 mg BID PRN. - [facility] database reviewed 06/29/19 - f/u 6 mos.   Acute pain of left shoulder stable.  Refills on Mobic 15 mg daily. - meloxicam (MOBIC) 15 MG tablet; Take 1 tablet (15 mg total) by mouth daily.  Dispense: 90 tablet; Refill: 1   6 months on chronic conditions.  > 15 minutes spent with patient, > 50% of that time face to face    Felix Pacini, DO 06/29/2019

## 2019-06-30 ENCOUNTER — Other Ambulatory Visit: Payer: Self-pay

## 2019-07-01 ENCOUNTER — Encounter: Payer: Self-pay | Admitting: Family Medicine

## 2019-07-01 ENCOUNTER — Ambulatory Visit: Payer: BC Managed Care – PPO | Admitting: Family Medicine

## 2019-07-01 VITALS — BP 114/78 | HR 71 | Temp 97.8°F | Resp 18 | Ht 65.0 in | Wt 180.4 lb

## 2019-07-01 DIAGNOSIS — R635 Abnormal weight gain: Secondary | ICD-10-CM | POA: Diagnosis not present

## 2019-07-01 DIAGNOSIS — R5383 Other fatigue: Secondary | ICD-10-CM

## 2019-07-01 DIAGNOSIS — E669 Obesity, unspecified: Secondary | ICD-10-CM | POA: Diagnosis not present

## 2019-07-01 MED ORDER — BUPROPION HCL ER (SR) 150 MG PO TB12: 150.0000 mg | ORAL_TABLET | Freq: Every day | ORAL | 0 refills | Status: DC

## 2019-07-01 NOTE — Progress Notes (Signed)
This visit occurred during the SARS-CoV-2 public health emergency.  Safety protocols were in place, including screening questions prior to the visit, additional usage of staff PPE, and extensive cleaning of exam room while observing appropriate contact time as indicated for disinfecting solutions.    Brenda Ray , [birth year], 48 y.o., female MRN: [id number] Patient Care Team    Relationship Specialty Notifications Start End  Natalia LeatherwoodKuneff,  A, DO PCP - General Family Medicine  05/01/15     Chief Complaint  Patient presents with  . Follow-up    Pt states she is here to get lab work and discuss weight loss      Subjective: Pt presents for an OV with complaints of weight gain.  Patient reports she usually eats 2 meals a day.  She has not tracked her calories.  She walks about 45 minutes a week prior to work.  She has not made much dietary changes, but admits she does love breads and carbohydrates.  She does not seem to eat a late dinner.  She has gained approximately 36 pounds in 1.5 years.   [facility] 06/29/2019 06/08/2018 11/03/2017 10/05/2017 10/02/2017  Decreased Interest 0 0 0 1 2  Down, Depressed, Hopeless 0 0 0 0 1  PHQ - 2 Score 0 0 0 1 3  Altered sleeping - - - 1 1  Tired, decreased energy - - - 1 2  Change in appetite - - - 1 0  Feeling bad or failure about yourself  - - - 0 0  Trouble concentrating - - - 0 1  Moving slowly or fidgety/restless - - - 0 0  Suicidal thoughts - - - 0 0  PHQ-9 Score - - - 4 7    Allergies  Allergen Reactions  . Augmentin [Amoxicillin-Pot Clavulanate] Diarrhea  . Doxycycline Nausea And Vomiting  . Hydrocodone Itching   Social History   Social History Narrative   Lives with Son and significant other. Engaged to be married.   [employer] administratordriver and cashier at the [employer]   Every day smoker   No etoh. No drugs. Wears her seatbelt.    Does not exercise daily.    Firearms present in the home in a gun safe.   Smoke alarm in the  home.    Feel safe in relationships.    Past Medical History:  Diagnosis Date  . Abnormal Pap smear of cervix    age 48  . Anemia   . Anxiety    use to use / take Lorazepam ., last episode of panic  2 yrs. ago  . Arthritis    hands, knees, back - Osteo  . Colon polyps    she reports colon polyps about 20 years ago; she was told 5 yr follow up.   . Complication of anesthesia    woke up before the tube was removed   . Cystitis   . Facial nerve disorder   . GERD (gastroesophageal reflux disease)   . Hiatal hernia    reports [facility] 20s [facility] Completed for pain and FOBT +.   . History of chicken pox   . Pneumonia    as a child- hosp.   . Vaginal delivery    x2   Past Surgical History:  Procedure Laterality Date  . ANTERIOR CERVICAL DECOMP/DISCECTOMY FUSION  2017   C3-C5  . BREAST BIOPSY Left 2014   fatty tissue only  .  CERVICAL DISCECTOMY  2014  . CHOLECYSTECTOMY N/A 06/16/2014   Procedure: LAPAROSCOPIC CHOLECYSTECTOMY ;  Surgeon: Abigail Miyamoto, MD;  [facility];  Service: General;  Laterality: N/A;  laparoscopic cholecystectomy  . GYNECOLOGIC CRYOSURGERY     age 58  . LAPAROSCOPIC ABDOMINAL EXPLORATION     for endometrimosis    Family History  Problem Relation Age of Onset  . Arthritis Mother   . Hyperlipidemia Mother   . Hypertension Mother   . Thyroid disease Mother        hypothyroid  . Arthritis Father   . Hyperlipidemia Father   . Hypertension Father   . Bladder Cancer Father   . Prostate cancer Father   . Thyroid disease Brother   . Arthritis Maternal Grandmother   . Arthritis Maternal Grandfather   . Arthritis Paternal Grandmother   . Arthritis Paternal Grandfather   . Breast cancer Maternal Aunt 50   Allergies as of 07/01/2019      Reactions   Augmentin [amoxicillin-pot Clavulanate] Diarrhea   Doxycycline Nausea And Vomiting   Hydrocodone Itching      Medication List       Accurate as of July 01, 2019  11:59 PM. If you have any questions, ask your nurse or doctor.        buPROPion 150 MG 12 hr tablet Commonly known as: WELLBUTRIN SR Take 1 tablet (150 mg total) by mouth daily. Started by: Felix Pacini, DO   cetirizine 10 MG tablet Commonly known as: ZYRTEC Take 1 tablet (10 mg total) by mouth at bedtime.   citalopram 40 MG tablet Commonly known as: CELEXA Take 1 tablet (40 mg total) by mouth daily.   fluticasone 50 MCG/ACT nasal spray Commonly known as: FLONASE Place 2 sprays into both nostrils daily.   LORazepam 0.5 MG tablet Commonly known as: ATIVAN Take 1 tablet (0.5 mg total) by mouth 2 (two) times daily as needed for anxiety.   meloxicam 15 MG tablet Commonly known as: MOBIC Take 1 tablet (15 mg total) by mouth daily.       All past medical history, surgical history, allergies, family history, immunizations andmedications were updated in the EMR today and reviewed under the history and medication portions of their EMR.     ROS: Negative, with the exception of above mentioned in HPI   Objective:  BP 114/78 (BP Location: Left Arm, Patient Position: Sitting, Cuff Size: Normal)   Pulse 71   Temp 97.8 F (36.6 C) (Temporal)   Resp 18   Ht 5\' 5"  (1.651 m)   Wt 180 lb 6 oz (81.8 kg)   SpO2 96%   BMI 30.02 kg/m  Body mass index is 30.02 kg/m. Gen: Afebrile. No acute distress. Nontoxic in appearance, well developed, well nourished.  Very pleasant, obese Caucasian female. HENT: AT. [place].  Eyes:Pupils Equal Round Reactive to light, Extraocular movements intact,  Conjunctiva without redness, discharge or icterus. Neck/lymp/endocrine: Supple, no lymphadenopathy, no thyromegaly CV: RRR , no edema Chest: CTAB, no wheeze or crackles.  Neuro: Alert. Oriented x3  Psych: Normal affect, dress and demeanor. Normal speech. Normal thought content and judgment.  No exam data present No results found. No results found for this or any previous visit (from the past 24  hour(s)).  Assessment/Plan: Brenda Ray is a 48 y.o. female present for OV for  Weight gain/obesity/fatigue -Discussed options with patient for weight loss medications.  She is agreeable to start Wellbutrin daily. -In addition she was instructed on calorie  calculator to manage daily caloric needs.  My fitness app to track her calories daily. -Avoidance of high carbohydrate meals and carbohydrates should be consumed through vegetables (not sugar/starch) when at all possible. Increase water consumption to at least 80-100 ounces daily.  Consume a glass of water prior to onset of meals. Increasing exercise will be key for her.  Initial benchmark of 150 minutes a week and fat burning zone for heart rate, which for her should be between Body mass index is 30.02 kg/m.  Weight 180 pounds - 6 ounces. Rule out thyroid disorder and B12 for weight gain and fatigue - TSH - T4, free - B12 -Follow-up in 4 weeks    Reviewed expectations re: course of current medical issues.  Discussed self-management of symptoms.  Outlined signs and symptoms indicating need for more acute intervention.  Patient verbalized understanding and all questions were answered.  Patient received an After-Visit Summary.    Orders Placed This Encounter  Procedures  . TSH  . T4, free  . B12   > 25 minutes spent with patient, >50% of time spent face to face     Note is dictated utilizing voice recognition software. Although note has been proof read prior to signing, occasional typographical errors still can be missed. If any questions arise, please do not hesitate to call for verification.   electronically signed by:  Howard Pouch, DO  [place]

## 2019-07-01 NOTE — Patient Instructions (Addendum)
https://www.calculator.net/calorie-calculator.html>> weekly calorie  My fitness pal app. Tracks calories.  Increase water to at least 100 ounces.  Drink a glass of water prior to meal.  Increase exercise to 150 min week. Heart rate should be around 150 for fat burning zone.     Exercising to Lose Weight Exercise is structured, repetitive physical activity to improve fitness and health. Getting regular exercise is important for everyone. It is especially important if you are overweight. Being overweight increases your risk of heart disease, stroke, diabetes, high blood pressure, and several types of cancer. Reducing your calorie intake and exercising can help you lose weight. Exercise is usually categorized as moderate or vigorous intensity. To lose weight, most people need to do a certain amount of moderate-intensity or vigorous-intensity exercise each week. Moderate-intensity exercise  Moderate-intensity exercise is any activity that gets you moving enough to burn at least three times more energy (calories) than if you were sitting. Examples of moderate exercise include:  Walking a mile in 15 minutes.  Doing light yard work.  Biking at an easy pace. Most people should get at least 150 minutes (2 hours and 30 minutes) a week of moderate-intensity exercise to maintain their body weight. Vigorous-intensity exercise Vigorous-intensity exercise is any activity that gets you moving enough to burn at least six times more calories than if you were sitting. When you exercise at this intensity, you should be working hard enough that you are not able to carry on a conversation. Examples of vigorous exercise include:  Running.  Playing a team sport, such as football, basketball, and soccer.  Jumping rope. Most people should get at least 75 minutes (1 hour and 15 minutes) a week of vigorous-intensity exercise to maintain their body weight. How can exercise affect me? When you exercise enough to  burn more calories than you eat, you lose weight. Exercise also reduces body fat and builds muscle. The more muscle you have, the more calories you burn. Exercise also:  Improves mood.  Reduces stress and tension.  Improves your overall fitness, flexibility, and endurance.  Increases bone strength. The amount of exercise you need to lose weight depends on:  Your age.  The type of exercise.  Any health conditions you have.  Your overall physical ability. Talk to your health care provider about how much exercise you need and what types of activities are safe for you. What actions can I take to lose weight? Nutrition   Make changes to your diet as told by your health care provider or diet and nutrition specialist (dietitian). This may include: ? Eating fewer calories. ? Eating more protein. ? Eating less unhealthy fats. ? Eating a diet that includes fresh fruits and vegetables, whole grains, low-fat dairy products, and lean protein. ? Avoiding foods with added fat, salt, and sugar.  Drink plenty of water while you exercise to prevent dehydration or heat stroke. Activity  Choose an activity that you enjoy and set realistic goals. Your health care provider can help you make an exercise plan that works for you.  Exercise at a moderate or vigorous intensity most days of the week. ? The intensity of exercise may vary from person to person. You can tell how intense a workout is for you by paying attention to your breathing and heartbeat. Most people will notice their breathing and heartbeat get faster with more intense exercise.  Do resistance training twice each week, such as: ? Push-ups. ? Sit-ups. ? Lifting weights. ? Using resistance bands.  Getting  short amounts of exercise can be just as helpful as long structured periods of exercise. If you have trouble finding time to exercise, try to include exercise in your daily routine. ? Get up, stretch, and walk around every 30  minutes throughout the day. ? Go for a walk during your lunch break. ? Park your car farther away from your destination. ? If you take public transportation, get off one stop early and walk the rest of the way. ? Make phone calls while standing up and walking around. ? Take the stairs instead of elevators or escalators.  Wear comfortable clothes and shoes with good support.  Do not exercise so much that you hurt yourself, feel dizzy, or get very short of breath. Where to find more information  U.S. Department of Health and Human Services: ThisPath.fi  Centers for Disease Control and Prevention (CDC): FootballExhibition.com.br Contact a health care provider:  Before starting a new exercise program.  If you have questions or concerns about your weight.  If you have a medical problem that keeps you from exercising. Get help right away if you have any of the following while exercising:  Injury.  Dizziness.  Difficulty breathing or shortness of breath that does not go away when you stop exercising.  Chest pain.  Rapid heartbeat. Summary  Being overweight increases your risk of heart disease, stroke, diabetes, high blood pressure, and several types of cancer.  Losing weight happens when you burn more calories than you eat.  Reducing the amount of calories you eat in addition to getting regular moderate or vigorous exercise each week helps you lose weight. This information is not intended to replace advice given to you by your health care provider. Make sure you discuss any questions you have with your health care provider. Document Released: 08/16/2010 Document Revised: 07/27/2017 Document Reviewed: 07/27/2017 Elsevier Patient Education  2020 [employer].

## 2019-07-02 LAB — VITAMIN B12: Vitamin B-12: 433 pg/mL (ref 200–1100)

## 2019-07-02 LAB — TSH: TSH: 2.71 mIU/L

## 2019-07-02 LAB — T4, FREE: Free T4: 0.8 ng/dL (ref 0.8–1.8)

## 2019-07-05 ENCOUNTER — Encounter: Payer: Self-pay | Admitting: Family Medicine

## 2019-07-05 DIAGNOSIS — E669 Obesity, unspecified: Secondary | ICD-10-CM | POA: Insufficient documentation

## 2019-07-05 DIAGNOSIS — R635 Abnormal weight gain: Secondary | ICD-10-CM | POA: Insufficient documentation

## 2019-07-05 DIAGNOSIS — R5383 Other fatigue: Secondary | ICD-10-CM | POA: Insufficient documentation

## 2019-07-06 DIAGNOSIS — M79672 Pain in left foot: Secondary | ICD-10-CM | POA: Diagnosis not present

## 2019-07-20 DIAGNOSIS — Z20828 Contact with and (suspected) exposure to other viral communicable diseases: Secondary | ICD-10-CM | POA: Diagnosis not present

## 2019-07-20 DIAGNOSIS — Z03818 Encounter for observation for suspected exposure to other biological agents ruled out: Secondary | ICD-10-CM | POA: Diagnosis not present

## 2019-10-05 ENCOUNTER — Other Ambulatory Visit: Payer: Self-pay | Admitting: Sports Medicine

## 2019-10-05 DIAGNOSIS — M25512 Pain in left shoulder: Secondary | ICD-10-CM

## 2019-10-05 DIAGNOSIS — M7522 Bicipital tendinitis, left shoulder: Secondary | ICD-10-CM | POA: Diagnosis not present

## 2019-10-28 ENCOUNTER — Other Ambulatory Visit: Payer: Self-pay

## 2019-10-28 ENCOUNTER — Ambulatory Visit
Admission: RE | Admit: 2019-10-28 | Discharge: 2019-10-28 | Disposition: A | Discharge status: Home / Self Care | Payer: BC Managed Care – PPO | Source: Ambulatory Visit | Attending: Sports Medicine | Admitting: Sports Medicine

## 2019-10-28 DIAGNOSIS — M25512 Pain in left shoulder: Secondary | ICD-10-CM

## 2019-10-28 DIAGNOSIS — M75102 Unspecified rotator cuff tear or rupture of left shoulder, not specified as traumatic: Secondary | ICD-10-CM | POA: Diagnosis not present

## 2019-10-31 DIAGNOSIS — M7582 Other shoulder lesions, left shoulder: Secondary | ICD-10-CM | POA: Diagnosis not present

## 2019-12-01 DIAGNOSIS — Z20828 Contact with and (suspected) exposure to other viral communicable diseases: Secondary | ICD-10-CM | POA: Diagnosis not present

## 2019-12-01 DIAGNOSIS — Z03818 Encounter for observation for suspected exposure to other biological agents ruled out: Secondary | ICD-10-CM | POA: Diagnosis not present

## 2019-12-06 DIAGNOSIS — Z03818 Encounter for observation for suspected exposure to other biological agents ruled out: Secondary | ICD-10-CM | POA: Diagnosis not present

## 2019-12-06 DIAGNOSIS — Z20828 Contact with and (suspected) exposure to other viral communicable diseases: Secondary | ICD-10-CM | POA: Diagnosis not present

## 2019-12-10 DIAGNOSIS — Z03818 Encounter for observation for suspected exposure to other biological agents ruled out: Secondary | ICD-10-CM | POA: Diagnosis not present

## 2019-12-10 DIAGNOSIS — Z20828 Contact with and (suspected) exposure to other viral communicable diseases: Secondary | ICD-10-CM | POA: Diagnosis not present

## 2019-12-12 ENCOUNTER — Other Ambulatory Visit: Payer: Self-pay

## 2019-12-12 ENCOUNTER — Telehealth (INDEPENDENT_AMBULATORY_CARE_PROVIDER_SITE_OTHER): Payer: BC Managed Care – PPO | Admitting: Family Medicine

## 2019-12-12 ENCOUNTER — Encounter: Payer: Self-pay | Admitting: Family Medicine

## 2019-12-12 VITALS — Temp 101.0°F | Ht 65.0 in

## 2019-12-12 DIAGNOSIS — J329 Chronic sinusitis, unspecified: Secondary | ICD-10-CM

## 2019-12-12 DIAGNOSIS — B9689 Other specified bacterial agents as the cause of diseases classified elsewhere: Secondary | ICD-10-CM | POA: Diagnosis not present

## 2019-12-12 MED ORDER — PREDNISONE 20 MG PO TABS: 60.0000 mg | ORAL_TABLET | Freq: Once | ORAL | 0 refills | Status: AC

## 2019-12-12 MED ORDER — CEFDINIR 300 MG PO CAPS: 300.0000 mg | ORAL_CAPSULE | Freq: Two times a day (BID) | ORAL | 0 refills | Status: DC

## 2019-12-12 NOTE — Progress Notes (Signed)
VIRTUAL VISIT VIA VIDEO  I connected with Brenda Ray on 12/12/19 at  2:30 PM EDT by elemedicine application and verified that I am speaking with the correct person using two identifiers. Location patient: Home Location provider: [facility], Office Persons participating in the virtual visit: Patient, Dr. Claiborne Billings and R.Baker, LPN  I discussed the limitations of evaluation and management by telemedicine and the availability of in person appointments. The patient expressed understanding and agreed to proceed.   SUBJECTIVE Chief Complaint  Patient presents with  . Cough    Started yesterday. Tested for COVID Saturday after co worker was +. She was tested twice 5 days after being in contact with person.   . Nasal Congestion  . Facial Pain  . Fever    HPI: Brenda Ray is a 49 y.o. female present for acute illness appt. She reports she has had sinus congestion, headaches, facial pressure, teeth pain, fever and myalgia that started Friday. [facility] was [facility] at [facility] Saturday and had a negative covid test. She denies chills, nausea, vomit, diarrhea, cough or shortness of breath. She had been exposed to a covid + co-worker about a week prior to her testing.   ROS: See pertinent positives and negatives per HPI.  Patient Active Problem List   Diagnosis Date Noted  . Obesity (BMI 30-39.9) 07/05/2019  . Weight gain 07/05/2019  . Other fatigue 07/05/2019  . Acute pain of left shoulder 08/02/2018  . Benzodiazepine contract exists 10/05/2017  . Anxiety 10/02/2017  . Hot flashes due to menopause 10/02/2017  . Stress incontinence 10/01/2015    Social History   Tobacco Use  . Smoking status: Former Smoker    Packs/day: 0.50    Years: 36.00    Pack years: 18.00    Types: Cigarettes, E-cigarettes    Quit date: 02/25/2017    Years since quitting: 2.7  . Smokeless tobacco: Former Engineer, water Use Topics  . Alcohol use: Not Currently    Current Outpatient Medications:    .  cetirizine (ZYRTEC) 10 MG tablet, Take 1 tablet (10 mg total) by mouth at bedtime., Disp: 90 tablet, Rfl: 3 .  citalopram (CELEXA) 40 MG tablet, Take 1 tablet (40 mg total) by mouth daily., Disp: 90 tablet, Rfl: 1 .  fluticasone (FLONASE) 50 MCG/ACT nasal spray, Place 2 sprays into both nostrils daily., Disp: 16 g, Rfl: 6 .  LORazepam (ATIVAN) 0.5 MG tablet, Take 1 tablet (0.5 mg total) by mouth 2 (two) times daily as needed for anxiety., Disp: 30 tablet, Rfl: 2 .  meloxicam (MOBIC) 15 MG tablet, Take 1 tablet (15 mg total) by mouth daily., Disp: 90 tablet, Rfl: 1 .  cefdinir (OMNICEF) 300 MG capsule, Take 1 capsule (300 mg total) by mouth 2 (two) times daily., Disp: 20 capsule, Rfl: 0 .  predniSONE (DELTASONE) 20 MG tablet, Take 3 tablets (60 mg total) by mouth once for 1 dose., Disp: 3 tablet, Rfl: 0  Allergies  Allergen Reactions  . Augmentin [Amoxicillin-Pot Clavulanate] Diarrhea  . Doxycycline Nausea And Vomiting  . Hydrocodone Itching    OBJECTIVE: Temp (!) 101 F (38.3 C) (Oral)   Ht 5\' 5"  (1.651 m)   BMI 30.02 kg/m  Gen: No acute distress. Nontoxic in appearance. Appears ill laying in bed.  HENT: AT. [employer].  MMM. Sinus congestion present.  Eyes:Pupils Equal Round Reactive to light, Extraocular movements intact,  Conjunctiva without redness, discharge or icterus. Chest: Cough and shortness of breath not  present.  Skin: no rashes, purpura or petechiae.  Neuro:  Normal gait. Alert. Oriented x3   ASSESSMENT AND PLAN: Brenda Ray is a 49 y.o. female present for  1. Bacterial sinusitis Rest, hydrate.  +/flonase, mucinex (DM if cough), nettie pot or nasal saline.  omnicef prescribed, take until completed.  Prednisone 60 mg only (can not tolerate continued daily dose) usually has Depo medrol injection- but can not 2/2 to [facility] policy and pt + with covid like symptoms.  If cough present it can last up to 6-8 weeks.  Work excuse provided for 3 days.  F/U 2 weeks of not improved.  Sooner if worsening.    Howard Pouch, DO 12/12/2019   No orders of the defined types were placed in this encounter.  Meds ordered this encounter  Medications  . predniSONE (DELTASONE) 20 MG tablet    Sig: Take 3 tablets (60 mg total) by mouth once for 1 dose.    Dispense:  3 tablet    Refill:  0    Just 60 mg (3 tabs)  . cefdinir (OMNICEF) 300 MG capsule    Sig: Take 1 capsule (300 mg total) by mouth 2 (two) times daily.    Dispense:  20 capsule    Refill:  0   Referral Orders  No referral(s) requested today

## 2020-01-09 ENCOUNTER — Other Ambulatory Visit: Payer: Self-pay | Admitting: *Deleted

## 2020-01-09 MED ORDER — CITALOPRAM HYDROBROMIDE 40 MG PO TABS: 40.0000 mg | ORAL_TABLET | Freq: Every day | ORAL | 0 refills | Status: DC

## 2020-01-17 ENCOUNTER — Telehealth (INDEPENDENT_AMBULATORY_CARE_PROVIDER_SITE_OTHER): Payer: BC Managed Care – PPO | Admitting: Family Medicine

## 2020-01-17 ENCOUNTER — Encounter: Payer: Self-pay | Admitting: Family Medicine

## 2020-01-17 VITALS — Ht 65.0 in

## 2020-01-17 DIAGNOSIS — M25512 Pain in left shoulder: Secondary | ICD-10-CM

## 2020-01-17 DIAGNOSIS — F410 Panic disorder [episodic paroxysmal anxiety] without agoraphobia: Secondary | ICD-10-CM

## 2020-01-17 DIAGNOSIS — N951 Menopausal and female climacteric states: Secondary | ICD-10-CM | POA: Diagnosis not present

## 2020-01-17 DIAGNOSIS — Z79899 Other long term (current) drug therapy: Secondary | ICD-10-CM

## 2020-01-17 DIAGNOSIS — F419 Anxiety disorder, unspecified: Secondary | ICD-10-CM

## 2020-01-17 MED ORDER — LORAZEPAM 0.5 MG PO TABS: 0.5000 mg | ORAL_TABLET | Freq: Two times a day (BID) | ORAL | 2 refills | Status: DC | PRN

## 2020-01-17 MED ORDER — CITALOPRAM HYDROBROMIDE 40 MG PO TABS: 40.0000 mg | ORAL_TABLET | Freq: Every day | ORAL | 1 refills | Status: DC

## 2020-01-17 MED ORDER — MELOXICAM 15 MG PO TABS: 15.0000 mg | ORAL_TABLET | Freq: Every day | ORAL | 1 refills | Status: DC

## 2020-01-17 NOTE — Progress Notes (Signed)
VIRTUAL VISIT VIA VIDEO  I connected with Brenda Ray on 01/17/20 at  8:00 AM EDT by a video enabled telemedicine application and verified that I am speaking with the correct person using two identifiers. Location patient: Home Location provider: [facility], Office Persons participating in the virtual visit: Patient, Dr. Raoul Pitch and R.Baker, LPN  I discussed the limitations of evaluation and management by telemedicine and the availability of in person appointments. The patient expressed understanding and agreed to proceed.  Interactive audio and video telecommunications were attempted between this provider and patient, however failed, due to patient having technical difficulties OR patient did not have access to video capability. We continued and completed visit with audio only.   SUBJECTIVE Chief Complaint  Patient presents with  . Anxiety    Needs refills     HPI: Brenda Ray is a 49 y.o. female present for [facility] follow up.  Anxiety/hot flashes:  Pt reports she is doing well on current regimen of celexa 40 mg Qd and ativan BID prn. Benzo contract in place. She reports she uses an ativan very infrequently. Last refill 11/30/2018 Indication for chronic controlled substance: Anxiety Medication and dose: 0.5 mg of Ativan twice daily as needed # pills per month: 30 Last UDS date: Collect next visit Pain contract signed (Y/N): Yes Date narcotic database last reviewed (include red flags):01/17/20   Shoulder pain: She is taking mobic Qd. It has helped a great deal with her shoulder pain.   ROS: See pertinent positives and negatives per HPI.  Patient Active Problem List   Diagnosis Date Noted  . Obesity (BMI 30-39.9) 07/05/2019  . Weight gain 07/05/2019  . Other fatigue 07/05/2019  . Acute pain of left shoulder 08/02/2018  . Benzodiazepine contract exists 10/05/2017  . Anxiety 10/02/2017  . Hot flashes due to menopause 10/02/2017  . Stress incontinence 10/01/2015     Social History   Tobacco Use  . Smoking status: Former Smoker    Packs/day: 0.50    Years: 36.00    Pack years: 18.00    Types: Cigarettes, E-cigarettes    Quit date: 02/25/2017    Years since quitting: 2.8  . Smokeless tobacco: Former Network engineer Use Topics  . Alcohol use: Not Currently    Current Outpatient Medications:  .  cetirizine (ZYRTEC) 10 MG tablet, Take 1 tablet (10 mg total) by mouth at bedtime., Disp: 90 tablet, Rfl: 3 .  citalopram (CELEXA) 40 MG tablet, Take 1 tablet (40 mg total) by mouth daily., Disp: 90 tablet, Rfl: 0 .  fluticasone (FLONASE) 50 MCG/ACT nasal spray, Place 2 sprays into both nostrils daily., Disp: 16 g, Rfl: 6 .  LORazepam (ATIVAN) 0.5 MG tablet, Take 1 tablet (0.5 mg total) by mouth 2 (two) times daily as needed for anxiety., Disp: 30 tablet, Rfl: 2 .  meloxicam (MOBIC) 15 MG tablet, Take 1 tablet (15 mg total) by mouth daily., Disp: 90 tablet, Rfl: 1 .  cefdinir (OMNICEF) 300 MG capsule, Take 1 capsule (300 mg total) by mouth 2 (two) times daily. (Patient not taking: Reported on 01/17/2020), Disp: 20 capsule, Rfl: 0  Allergies  Allergen Reactions  . Augmentin [Amoxicillin-Pot Clavulanate] Diarrhea  . Doxycycline Nausea And Vomiting  . Hydrocodone Itching    OBJECTIVE: Ht 5\' 5"  (1.651 m)   BMI 30.02 kg/m  Gen: Afebrile. No acute distress.  HENT: AT. [place]. Eyes:Pupils Equal Round Reactive to light, Extraocular movements intact,  Conjunctiva without redness, discharge or icterus. Chest:  no cough or shortness of breath Neuro:  Alert. Oriented x3 Psych: Normal affect, dress and demeanor. Normal speech. Normal thought content and judgment    Depression screen [facility] 2/9 06/29/2019 06/08/2018 11/03/2017 10/05/2017 10/02/2017  Decreased Interest 0 0 0 1 2  Down, Depressed, Hopeless 0 0 0 0 1  PHQ - 2 Score 0 0 0 1 3  Altered sleeping - - - 1 1  Tired, decreased energy - - - 1 2  Change in appetite - - - 1 0  Feeling bad or failure about yourself  -  - - 0 0  Trouble concentrating - - - 0 1  Moving slowly or fidgety/restless - - - 0 0  Suicidal thoughts - - - 0 0  PHQ-9 Score - - - 4 7   GAD 7 : Generalized Anxiety Score 01/17/2020 06/29/2019 11/30/2018 06/08/2018  Nervous, Anxious, on Edge 0 0 0 0  Control/stop worrying 0 0 0 0  Worry too much - different things 0 0 0 0  Trouble relaxing 0 0 0 0  Restless 0 0 0 0  Easily annoyed or irritable 0 0 0 0  Afraid - awful might happen 0 0 0 0  Total GAD 7 Score 0 0 0 0  Anxiety Difficulty Not difficult at all Not difficult at all Not difficult at all Not difficult at all    ASSESSMENT AND PLAN: Brenda Ray is a 49 y.o. female present for  Anxiety/Hot flashes due to menopauseBenzodiazepine contract exists/panic - stable.    Doing very well with anxiety and hot flashes. - continue  celexa 40 mg QD  - continue ativan 0.5 mg BID PRN. Uses very infrequently.  - [facility] database reviewed 01/17/20 - f/u 6 mos.   Acute pain of left shoulder Stable.  Continue  Mobic 15 mg daily.   6 months on chronic conditions.   No orders of the defined types were placed in this encounter.  Meds ordered this encounter  Medications  . meloxicam (MOBIC) 15 MG tablet    Sig: Take 1 tablet (15 mg total) by mouth daily.    Dispense:  90 tablet    Refill:  1  . citalopram (CELEXA) 40 MG tablet    Sig: Take 1 tablet (40 mg total) by mouth daily.    Dispense:  90 tablet    Refill:  1    Needs ov/follow up  . LORazepam (ATIVAN) 0.5 MG tablet    Sig: Take 1 tablet (0.5 mg total) by mouth 2 (two) times daily as needed for anxiety.    Dispense:  30 tablet    Refill:  2    Hold until pt request   Referral Orders  No referral(s) requested today      Felix Pacini, DO 01/17/2020

## 2020-04-16 ENCOUNTER — Encounter: Payer: Self-pay | Admitting: Family Medicine

## 2020-04-16 ENCOUNTER — Ambulatory Visit (INDEPENDENT_AMBULATORY_CARE_PROVIDER_SITE_OTHER): Payer: BC Managed Care – PPO

## 2020-04-16 ENCOUNTER — Telehealth (INDEPENDENT_AMBULATORY_CARE_PROVIDER_SITE_OTHER): Payer: BC Managed Care – PPO | Admitting: Family Medicine

## 2020-04-16 ENCOUNTER — Other Ambulatory Visit: Payer: Self-pay

## 2020-04-16 VITALS — Temp 99.0°F

## 2020-04-16 DIAGNOSIS — R059 Cough, unspecified: Secondary | ICD-10-CM

## 2020-04-16 DIAGNOSIS — R05 Cough: Secondary | ICD-10-CM

## 2020-04-16 DIAGNOSIS — B9689 Other specified bacterial agents as the cause of diseases classified elsewhere: Secondary | ICD-10-CM | POA: Diagnosis not present

## 2020-04-16 DIAGNOSIS — R0981 Nasal congestion: Secondary | ICD-10-CM

## 2020-04-16 DIAGNOSIS — J329 Chronic sinusitis, unspecified: Secondary | ICD-10-CM

## 2020-04-16 MED ORDER — CEFDINIR 300 MG PO CAPS: 300.0000 mg | ORAL_CAPSULE | Freq: Two times a day (BID) | ORAL | 0 refills | Status: DC

## 2020-04-16 MED ORDER — METHYLPREDNISOLONE ACETATE 80 MG/ML IJ SUSP
80.0000 mg | Freq: Once | INTRAMUSCULAR | Status: AC
  Administered 2020-04-16: 80 mg via INTRAMUSCULAR

## 2020-04-16 NOTE — Progress Notes (Signed)
VIRTUAL VISIT VIA VIDEO  I connected with Brenda Ray on 04/16/20 at 11:30 AM EDT by elemedicine application and verified that I am speaking with the correct person using two identifiers. Location patient: Home Location provider: [facility], Office Persons participating in the virtual visit: Patient, Dr. Claiborne Billings and Paulina Fusi, CMA  I discussed the limitations of evaluation and management by telemedicine and the availability of in person appointments. The patient expressed understanding and agreed to proceed.   SUBJECTIVE Chief Complaint  Patient presents with  . Nasal Congestion    pt c/o fever (101), nasal congestion, body ache, chills, headache, productive cough x 2 days; no known exposure;     HPI: Brenda Ray is a 49 y.o. female present today virtually to discuss her new onset symptoms of headache, nasal congestion, fever (TMAX 101F), cough, body ache and nasal drainage worsening last 2 days, present for 4 days. She denies loss of taste or smell. She denies shortness of breath. She states she has noticed her teeth hurt like when she has a bad sinus infection. She has tried advil, mucinex DM, nasal saline and advil for her symptoms.  She has not had her covid vaccines. 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   ROS: See pertinent positives and negatives per HPI.  Patient Active Problem List   Diagnosis Date Noted  . Obesity (BMI 30-39.9) 07/05/2019  . Weight gain 07/05/2019  . Other fatigue 07/05/2019  . Acute pain of left shoulder 08/02/2018  . Benzodiazepine contract exists 10/05/2017  . Anxiety 10/02/2017  . Hot flashes due to menopause 10/02/2017  . Stress incontinence 10/01/2015    Social History   Tobacco Use  . Smoking status: Former Smoker    Packs/day: 0.50    Years: 36.00    Pack years: 18.00    Types: Cigarettes, E-cigarettes    Quit date: 02/25/2017    Years since quitting: 3.1  . Smokeless tobacco: Former Engineer, water Use Topics    . Alcohol use: Not Currently    Current Outpatient Medications:  .  cetirizine (ZYRTEC) 10 MG tablet, Take 1 tablet (10 mg total) by mouth at bedtime., Disp: 90 tablet, Rfl: 3 .  citalopram (CELEXA) 40 MG tablet, Take 1 tablet (40 mg total) by mouth daily., Disp: 90 tablet, Rfl: 1 .  LORazepam (ATIVAN) 0.5 MG tablet, Take 1 tablet (0.5 mg total) by mouth 2 (two) times daily as needed for anxiety., Disp: 30 tablet, Rfl: 2 .  meloxicam (MOBIC) 15 MG tablet, Take 1 tablet (15 mg total) by mouth daily., Disp: 90 tablet, Rfl: 1 .  cefdinir (OMNICEF) 300 MG capsule, Take 1 capsule (300 mg total) by mouth 2 (two) times daily., Disp: 20 capsule, Rfl: 0 .  fluticasone (FLONASE) 50 MCG/ACT nasal spray, Place 2 sprays into both nostrils daily. (Patient not taking: Reported on 04/16/2020), Disp: 16 g, Rfl: 6  Allergies  Allergen Reactions  . Augmentin [Amoxicillin-Pot Clavulanate] Diarrhea  . Doxycycline Nausea And Vomiting  . Hydrocodone Itching    OBJECTIVE: Temp 99 F (37.2 C) (Oral)  Gen: No acute distress. Nontoxic in appearance.  HENT: AT. [place].  MMM.  Chest: Cough present on exam, no shortness of breath Skin: no rashes, purpura or petechiae.  Neuro: Alert. Oriented x3  Psych: Normal affect and demeanor. Normal speech. Normal thought content and judgment.  ASSESSMENT AND PLAN: Brenda Ray is a 49 y.o. female present for  Bacterial sinusitis/Cough/[place], hydrate.  +/-  flonase, mucinex (DM if cough), nettie pot or nasal saline.  omnicef prescribed, take until completed.  If cough present it can last up to 6-8 weeks.  - Novel Coronavirus, NAA ([facility]); Future - verbal order for IM depo medrol 80 inj at time of covid testing has been given to CMA.   - work excuse provided.  F/U 2 weeks of not improved.   > 20 Minutes was dedicated to this patient's encounter to include pre-visit review of chart, face-to-face time with patient and post-visit work- which include documentation and  prescribing medications and/or ordering test when necessary.     Felix Pacini, DO 04/16/2020   Return if symptoms worsen or fail to improve.  Orders Placed This Encounter  Procedures  . Novel Coronavirus, NAA ([facility])   Meds ordered this encounter  Medications  . cefdinir (OMNICEF) 300 MG capsule    Sig: Take 1 capsule (300 mg total) by mouth 2 (two) times daily.    Dispense:  20 capsule    Refill:  0   Referral Orders  No referral(s) requested today

## 2020-04-18 ENCOUNTER — Telehealth: Payer: Self-pay

## 2020-04-18 NOTE — Telephone Encounter (Signed)
Patient calling regarding COVID test results.  patient states that she has improved.  Her employer is needing her back at work as soon as possible.  Please call patient as soon as possible when results are made available.  [phone]

## 2020-04-19 ENCOUNTER — Telehealth: Payer: Self-pay | Admitting: Family Medicine

## 2020-04-19 ENCOUNTER — Encounter: Payer: Self-pay | Admitting: Family Medicine

## 2020-04-19 LAB — NOVEL CORONAVIRUS, NAA: SARS-CoV-2, NAA: DETECTED — AB

## 2020-04-19 LAB — SARS-COV-2, NAA 2 DAY TAT

## 2020-04-19 NOTE — Telephone Encounter (Signed)
Please inform patient her Covid test is positive.   - Please provide her with a work excuse from day of appointment 04/16/2020 to be out of work due to Covid positive testing with illness, with return to work date Wednesday April 25, 2020.  She will need to: Rest.   Hydrate.  Hydration is very important.  Would recommend electrolyte replacement such as Gatorade and water. Take medications as prescribed. Monitor symptoms Seek prompt medical attention if  illness is worsening (e.g., difficulty breathing).  Self isolate-Stay home except to get medical care Do not go to work, school, or public areas, and do not use public transportation or taxis.  Wear a facemask You should wear a facemask that covers your nose and mouth when you are in the same room with other people  Separate yourself from other people in your home  Avoid sharing household items Wash your handsSh[facility] your hands often and thoroughly with soap and water for at least 20 seconds.   Follow-up by Tuesday, September 28 with this provider virtually if symptoms are not improving, sooner if worsening.

## 2020-04-19 NOTE — Telephone Encounter (Signed)
Pt aware of result.

## 2020-04-19 NOTE — Telephone Encounter (Signed)
LVM to CB if have any additional questions regarding message below.

## 2020-04-20 ENCOUNTER — Other Ambulatory Visit (HOSPITAL_COMMUNITY): Payer: Self-pay | Admitting: Nurse Practitioner

## 2020-04-20 DIAGNOSIS — E669 Obesity, unspecified: Secondary | ICD-10-CM

## 2020-04-20 DIAGNOSIS — U071 COVID-19: Secondary | ICD-10-CM

## 2020-04-20 NOTE — Telephone Encounter (Signed)
MyChart message read.

## 2020-04-20 NOTE — Progress Notes (Signed)
I connected by phone with Brenda Ray on 04/20/2020 at 5:07 PM to discuss the potential use of a new treatment for mild to moderate COVID-19 viral infection in non-hospitalized patients.  This patient is a 50 y.o. female that meets the FDA criteria for Emergency Use Authorization of COVID monoclonal antibody casirivimab/imdevimab or bamlanivimab/eteseviamb.  Has a (+) direct SARS-CoV-2 viral test result  Has mild or moderate COVID-19   Is NOT hospitalized due to COVID-19  Is within 10 days of symptom onset  Has at least one of the high risk factor(s) for progression to severe COVID-19 and/or hospitalization as defined in EUA.  Specific high risk criteria : BMI > 25   I have spoken and communicated the following to the patient or parent/caregiver regarding COVID monoclonal antibody treatment:  1. FDA has authorized the emergency use for the treatment of mild to moderate COVID-19 in adults and pediatric patients with positive results of direct SARS-CoV-2 viral testing who are 51 years of age and older weighing at least 40 kg, and who are at high risk for progressing to severe COVID-19 and/or hospitalization.  2. The significant known and potential risks and benefits of COVID monoclonal antibody, and the extent to which such potential risks and benefits are unknown.  3. Information on available alternative treatments and the risks and benefits of those alternatives, including clinical trials.  4. Patients treated with COVID monoclonal antibody should continue to self-isolate and use infection control measures (e.g., wear mask, isolate, social distance, avoid sharing personal items, clean and disinfect "high touch" surfaces, and frequent handwashing) according to CDC guidelines.   5. The patient or parent/caregiver has the option to accept or refuse COVID monoclonal antibody treatment.  After reviewing this information with the patient, the patient has agreed to receive one of the available  covid 19 monoclonal antibodies and will be provided an appropriate fact sheet prior to infusion. [name], NP 04/20/2020 5:07 PM

## 2020-04-21 ENCOUNTER — Other Ambulatory Visit (HOSPITAL_COMMUNITY): Payer: Self-pay

## 2020-04-21 ENCOUNTER — Telehealth (HOSPITAL_COMMUNITY): Payer: Self-pay

## 2020-04-21 ENCOUNTER — Ambulatory Visit (HOSPITAL_COMMUNITY)
Admission: RE | Admit: 2020-04-21 | Discharge: 2020-04-21 | Disposition: A | Discharge status: Home / Self Care | Payer: BC Managed Care – PPO | Source: Ambulatory Visit | Attending: Pulmonary Disease | Admitting: Pulmonary Disease

## 2020-04-21 DIAGNOSIS — E669 Obesity, unspecified: Secondary | ICD-10-CM | POA: Diagnosis not present

## 2020-04-21 DIAGNOSIS — U071 COVID-19: Secondary | ICD-10-CM | POA: Diagnosis not present

## 2020-04-21 MED ORDER — LOPERAMIDE HCL 2 MG PO CAPS
4.0000 mg | ORAL_CAPSULE | ORAL | Status: AC | PRN
  Administered 2020-04-21: 4 mg via ORAL
  Filled 2020-04-21: qty 2

## 2020-04-21 MED ORDER — DIPHENHYDRAMINE HCL 50 MG/ML IJ SOLN: 50.0000 mg | Freq: Once | INTRAMUSCULAR | Status: DC | PRN

## 2020-04-21 MED ORDER — SODIUM CHLORIDE 0.9 % IV SOLN: Freq: Once | INTRAVENOUS | Status: AC

## 2020-04-21 MED ORDER — METHYLPREDNISOLONE SODIUM SUCC 125 MG IJ SOLR: 125.0000 mg | Freq: Once | INTRAMUSCULAR | Status: DC | PRN

## 2020-04-21 MED ORDER — ALBUTEROL SULFATE HFA 108 (90 BASE) MCG/ACT IN AERS: 2.0000 | INHALATION_SPRAY | Freq: Once | RESPIRATORY_TRACT | Status: DC | PRN

## 2020-04-21 MED ORDER — EPINEPHRINE 0.3 MG/0.3ML IJ SOAJ: 0.3000 mg | Freq: Once | INTRAMUSCULAR | Status: DC | PRN

## 2020-04-21 MED ORDER — FAMOTIDINE IN NACL 20-0.9 MG/50ML-% IV SOLN: 20.0000 mg | Freq: Once | INTRAVENOUS | Status: DC | PRN

## 2020-04-21 MED ORDER — SODIUM CHLORIDE 0.9 % IV SOLN
1200.0000 mg | Freq: Once | INTRAVENOUS | Status: AC
  Administered 2020-04-21: 1200 mg via INTRAVENOUS

## 2020-04-21 MED ORDER — SODIUM CHLORIDE 0.9 % IV SOLN: INTRAVENOUS | Status: DC | PRN

## 2020-04-21 NOTE — Discharge Instructions (Signed)

## 2020-04-21 NOTE — Progress Notes (Signed)
°  Diagnosis: COVID-19  Physician: Dr Delford Field  Procedure: Covid Infusion Clinic Med: casirivimab\imdevimab infusion - Provided patient with casirivimab\imdevimab fact sheet for patients, parents and caregivers prior to infusion.  Complications: No immediate complications noted.   Patient had LBM x7, 1L of .9 nacl given as ordered. 2 tabs of loperamide given. Teaching reinforced.  Discharge: Discharged home   Reginia Forts [place]arece 04/21/2020

## 2020-04-21 NOTE — Telephone Encounter (Signed)
Brenda Ray, patients husband has called states that the patient is c/o chest pain.  Per our Lead RN, Brenda Ray, pt was told to go to the ER since we cannot assess what type of chest pain patient is having. Pt husband verbalized understanding and states that this is what he will do.  Pt is also c/o shakes and chills.  Pt states that she had a fever and has not taken any medications to relieve her fever since 0830.  Recommended that she take either ibuprofen or tylenol to help with those symptoms.  Pt husband verbalized understanding again and will give her some medications to help with her fever.  No further needs, call ended.

## 2020-05-05 ENCOUNTER — Telehealth: Payer: Self-pay

## 2020-05-05 NOTE — Telephone Encounter (Signed)
Patient left VM yesterday stating she was diagnosed with covid and completed infusion. She is still having lingering cough with congestion. Tried to contact patient to offer VV with Dr.McGowen today to further assist and provide recommendations. LM to return call if interested.

## 2020-06-12 ENCOUNTER — Telehealth (INDEPENDENT_AMBULATORY_CARE_PROVIDER_SITE_OTHER): Payer: BC Managed Care – PPO | Admitting: Family Medicine

## 2020-06-12 ENCOUNTER — Encounter: Payer: Self-pay | Admitting: Family Medicine

## 2020-06-12 DIAGNOSIS — B9689 Other specified bacterial agents as the cause of diseases classified elsewhere: Secondary | ICD-10-CM | POA: Diagnosis not present

## 2020-06-12 DIAGNOSIS — J040 Acute laryngitis: Secondary | ICD-10-CM | POA: Diagnosis not present

## 2020-06-12 DIAGNOSIS — J329 Chronic sinusitis, unspecified: Secondary | ICD-10-CM

## 2020-06-12 MED ORDER — CEFDINIR 300 MG PO CAPS
300.0000 mg | ORAL_CAPSULE | Freq: Two times a day (BID) | ORAL | 0 refills | Status: DC
Start: 2020-06-12 — End: 2020-08-22

## 2020-06-12 MED ORDER — METHYLPREDNISOLONE ACETATE 80 MG/ML IJ SUSP
80.0000 mg | Freq: Once | INTRAMUSCULAR | Status: AC
  Administered 2020-06-12: 80 mg via INTRAMUSCULAR

## 2020-06-12 NOTE — Progress Notes (Signed)
VIRTUAL VISIT VIA VIDEO  I connected with Brenda Ray on 06/12/20 at  4:00 PM EST by elemedicine application and verified that I am speaking with the correct person using two identifiers. Location patient: Home Location provider: [facility], Office Persons participating in the virtual visit: Patient, Dr. Claiborne Billings and Paulina Fusi, CMA  I discussed the limitations of evaluation and management by telemedicine and the availability of in person appointments. The patient expressed understanding and agreed to proceed.   SUBJECTIVE Chief Complaint  Patient presents with  . Cough    cough, congestion, hoarse x 3 days    HPI: Brenda Ray is a 49 y.o. female present for productive cough, nasal congestion-"chunks of green snot", left sided facial pressure and hoarseness worsening over the last  3 days- present for 1 week after blowing leaves out in her yard last week. She denies fever, chills, nausea or vomit. She had covid less than 3 months ago.    ROS: See pertinent positives and negatives per HPI.  Patient Active Problem List   Diagnosis Date Noted  . Obesity (BMI 30-39.9) 07/05/2019  . Weight gain 07/05/2019  . Other fatigue 07/05/2019  . Acute pain of left shoulder 08/02/2018  . Benzodiazepine contract exists 10/05/2017  . Anxiety 10/02/2017  . Hot flashes due to menopause 10/02/2017  . Stress incontinence 10/01/2015    Social History   Tobacco Use  . Smoking status: Former Smoker    Packs/day: 0.50    Years: 36.00    Pack years: 18.00    Types: Cigarettes, E-cigarettes    Quit date: 02/25/2017    Years since quitting: 3.2  . Smokeless tobacco: Former Engineer, water Use Topics  . Alcohol use: Not Currently    Current Outpatient Medications:  .  cetirizine (ZYRTEC) 10 MG tablet, Take 1 tablet (10 mg total) by mouth at bedtime., Disp: 90 tablet, Rfl: 3 .  citalopram (CELEXA) 40 MG tablet, Take 1 tablet (40 mg total) by mouth daily., Disp: 90 tablet, Rfl: 1 .   fluticasone (FLONASE) 50 MCG/ACT nasal spray, Place 2 sprays into both nostrils daily., Disp: 16 g, Rfl: 6 .  LORazepam (ATIVAN) 0.5 MG tablet, Take 1 tablet (0.5 mg total) by mouth 2 (two) times daily as needed for anxiety., Disp: 30 tablet, Rfl: 2 .  meloxicam (MOBIC) 15 MG tablet, Take 1 tablet (15 mg total) by mouth daily., Disp: 90 tablet, Rfl: 1 .  cefdinir (OMNICEF) 300 MG capsule, Take 1 capsule (300 mg total) by mouth 2 (two) times daily., Disp: 20 capsule, Rfl: 0  Allergies  Allergen Reactions  . Augmentin [Amoxicillin-Pot Clavulanate] Diarrhea  . Doxycycline Nausea And Vomiting  . Hydrocodone Itching    OBJECTIVE: There were no vitals taken for this visit. Gen: No acute distress. Nontoxic in appearance.  HENT: AT. [place].  MMM.  Eyes:Pupils Equal Round Reactive to light, Extraocular movements intact,  Conjunctiva without redness, discharge or icterus. Chest: Cough not present, loss of voice present, TTP left max sinus Skin: no rashes, purpura or petechiae.  Neuro:  Alert. Oriented x3  Psych: Normal affect and demeanor. Normal speech. Normal thought content and judgment.  ASSESSMENT AND PLAN: NELL GALES is a 49 y.o. female present for  Bacterial sinusitis/Laryngitis Rest, hydrate.  +/- flonase, mucinex (DM if cough), nettie pot or nasal saline.  omnicef prescribed, take until completed.  Depo medrol 80 administered  by drive up/nurse If cough present it can last up to 6-8 weeks.  F/U 2 weeks of not improved.    Felix Pacini, DO 06/12/2020   No follow-ups on file.  No orders of the defined types were placed in this encounter.  Meds ordered this encounter  Medications  . cefdinir (OMNICEF) 300 MG capsule    Sig: Take 1 capsule (300 mg total) by mouth 2 (two) times daily.    Dispense:  20 capsule    Refill:  0   Referral Orders  No referral(s) requested today

## 2020-06-12 NOTE — Addendum Note (Signed)
Addended by: Paschal Dopp on: 06/12/2020 11:44 AM   Modules accepted: Orders

## 2020-08-22 ENCOUNTER — Other Ambulatory Visit: Payer: Self-pay | Admitting: Family Medicine

## 2020-08-22 ENCOUNTER — Telehealth: Payer: Self-pay | Admitting: Family Medicine

## 2020-08-22 ENCOUNTER — Telehealth (INDEPENDENT_AMBULATORY_CARE_PROVIDER_SITE_OTHER): Payer: BC Managed Care – PPO | Admitting: Family Medicine

## 2020-08-22 ENCOUNTER — Encounter: Payer: Self-pay | Admitting: Family Medicine

## 2020-08-22 DIAGNOSIS — K219 Gastro-esophageal reflux disease without esophagitis: Secondary | ICD-10-CM | POA: Diagnosis not present

## 2020-08-22 DIAGNOSIS — R110 Nausea: Secondary | ICD-10-CM | POA: Diagnosis not present

## 2020-08-22 DIAGNOSIS — R5381 Other malaise: Secondary | ICD-10-CM

## 2020-08-22 MED ORDER — PANTOPRAZOLE SODIUM 40 MG PO TBEC: 40.0000 mg | DELAYED_RELEASE_TABLET | Freq: Two times a day (BID) | ORAL | 3 refills | Status: DC

## 2020-08-22 NOTE — Telephone Encounter (Signed)
Please advise. Pt hasnt had CPE in years

## 2020-08-22 NOTE — Telephone Encounter (Signed)
Patient requesting referral to [facility] on [address] in [place], for annual mammogram.

## 2020-08-22 NOTE — Progress Notes (Signed)
VIRTUAL VISIT VIA VIDEO  I connected with Brenda Ray on 08/22/20 at 11:00 AM EST by elemedicine application and verified that I am speaking with the correct person using two identifiers. Location patient: Home Location provider: [facility], Office Persons participating in the virtual visit: Patient, Dr. Claiborne Billings and Paulina Fusi, CMA  I discussed the limitations of evaluation and management by telemedicine and the availability of in person appointments. The patient expressed understanding and agreed to proceed.   SUBJECTIVE Chief Complaint  Patient presents with  . Heartburn    Pt c/o heartburn worsen in the last week;     HPI: Brenda Ray is a 50 y.o. female present for increased gerd symptoms over the last week. She reports her diet os about the same. She is a smoker. She endorses increased stress. She has been using  OTC Prilosec and pepcid complete BID. She endorses classic heartburn symptoms and reflux/stomach acid regurg. It has made her nauseated a few times - no vomiting. She has a h/o of reflux and has had to take protonix in the past- which worked well.   ROS: See pertinent positives and negatives per HPI.  Patient Active Problem List   Diagnosis Date Noted  . GERD without esophagitis 08/22/2020  . Obesity (BMI 30-39.9) 07/05/2019  . Weight gain 07/05/2019  . Other fatigue 07/05/2019  . Acute pain of left shoulder 08/02/2018  . Benzodiazepine contract exists 10/05/2017  . Anxiety 10/02/2017  . Hot flashes due to menopause 10/02/2017  . Stress incontinence 10/01/2015    Social History   Tobacco Use  . Smoking status: Former Smoker    Packs/day: 0.50    Years: 36.00    Pack years: 18.00    Types: Cigarettes, E-cigarettes    Quit date: 02/25/2017    Years since quitting: 3.4  . Smokeless tobacco: Former Engineer, water Use Topics  . Alcohol use: Not Currently    Current Outpatient Medications:  .  cetirizine (ZYRTEC) 10 MG tablet, Take 1 tablet (10 mg  total) by mouth at bedtime., Disp: 90 tablet, Rfl: 3 .  citalopram (CELEXA) 40 MG tablet, Take 1 tablet (40 mg total) by mouth daily., Disp: 90 tablet, Rfl: 1 .  fluticasone (FLONASE) 50 MCG/ACT nasal spray, Place 2 sprays into both nostrils daily., Disp: 16 g, Rfl: 6 .  LORazepam (ATIVAN) 0.5 MG tablet, Take 1 tablet (0.5 mg total) by mouth 2 (two) times daily as needed for anxiety., Disp: 30 tablet, Rfl: 2 .  meloxicam (MOBIC) 15 MG tablet, Take 1 tablet (15 mg total) by mouth daily., Disp: 90 tablet, Rfl: 1 .  pantoprazole (PROTONIX) 40 MG tablet, Take 1 tablet (40 mg total) by mouth 2 (two) times daily., Disp: 60 tablet, Rfl: 3  Allergies  Allergen Reactions  . Augmentin [Amoxicillin-Pot Clavulanate] Diarrhea  . Doxycycline Nausea And Vomiting  . Hydrocodone Itching    OBJECTIVE: There were no vitals taken for this visit. Gen: No acute distress. Nontoxic in appearance.  HENT: AT. [place].  MMM.  Chest: Cough not present on exam . Neuro: Alert. Oriented x3  Psych: Normal affect and demeanor. Normal speech. Normal thought content and judgment.  ASSESSMENT AND PLAN: Brenda Ray is a 50 y.o. female present for  GERD without esophagitis/nausea Worsening GERD despite Omeprazole and pepcid use.  DC OTC omeprazole.  Start protonix 40 mg BID for 2 weeks, then decrease to QD.  May continue pepcid BID if needed.  If sx are not  improving within 2 weeks, or worsening> follow up in person, would need to collect labs and consider tx for h.pylori. Pt reports understanding.    Felix Pacini, DO 08/22/2020   Return in about 3 months (around 11/20/2020), or if symptoms worsen or fail to improve.  No orders of the defined types were placed in this encounter.  Meds ordered this encounter  Medications  . pantoprazole (PROTONIX) 40 MG tablet    Sig: Take 1 tablet (40 mg total) by mouth 2 (two) times daily.    Dispense:  60 tablet    Refill:  3   Referral Orders  No referral(s) requested today

## 2020-08-22 NOTE — Telephone Encounter (Signed)
If a diagnostic mammogram is being requested, then patient must of had a complaint when trying to make appointment for her screening mammogram?  Please call patient and see why they are requesting a diagnostic mammogram.  If needing it because she answered yes to 1 other screening questions about breast pain etc., then she will need an in person appointment for breast exam in order for me to order right diagnostic mammogram.

## 2020-08-22 NOTE — Telephone Encounter (Signed)
Patient called back to add that she spoke with [facility] and they requested referral for: Bilateral diagnostic mammogram and bilateral ultrasound.

## 2020-08-23 NOTE — Telephone Encounter (Signed)
Pt states she has cyst in her breast and that is why they are requesting it.

## 2020-08-23 NOTE — Telephone Encounter (Signed)
LVM for pt to CB regarding orders requested

## 2020-08-23 NOTE — Telephone Encounter (Signed)
Routing to team pool

## 2020-08-23 NOTE — Telephone Encounter (Signed)
Copy of [place] recs from last mammogram.  "Otherwise, screening mammogram is recommended in one year.(Code:[id number]-B-01Y)  BI-RADS CATEGORY  2: Benign."   This does not state a diagnostic mammogram is recommended. It states "screening."  Please contact mammogram center and clarify reason for diagnostic mammogram needed since is a conflicting request from radiology recommendations based on last mammogram.   Diagnostic mamms are more expensive and not covered fully by insurance. Screenings are covered... this could cost patient a great deal of money when it seems to not be required, if basing recs from last mammogram.    Thanks.

## 2020-08-23 NOTE — Telephone Encounter (Signed)
Per radiology, pt only needs a screening unless pt is having any issues

## 2020-08-23 NOTE — Telephone Encounter (Signed)
Scheduled pt for OV on 08/27/20 @1  pm. Please advise if pt needs to keep this appt.

## 2020-08-24 NOTE — Telephone Encounter (Addendum)
Please make sure patient is aware radiology now states she only needs a screening.   That order had already been placed prior.  She can schedule her screening mammogram.  She does not need to see this provider-please make sure the 08/27/2020 at 1 PM appointment is canceled for her if it was just for a breast exam-unless she is having breast symptoms I do not need to see her.  I would strongly encourage she make an appointment for her physical she is overdue on many screening measures.   Thanks

## 2020-08-24 NOTE — Telephone Encounter (Signed)
Pt made aware

## 2020-08-27 ENCOUNTER — Other Ambulatory Visit: Payer: Self-pay | Admitting: Family Medicine

## 2020-08-27 ENCOUNTER — Ambulatory Visit: Payer: BC Managed Care – PPO | Admitting: Family Medicine

## 2020-08-27 DIAGNOSIS — Z0000 Encounter for general adult medical examination without abnormal findings: Secondary | ICD-10-CM

## 2020-08-27 NOTE — Telephone Encounter (Signed)
LVM to confirm that appt was cancelled since we was able to order mammogram

## 2020-09-04 ENCOUNTER — Ambulatory Visit: Payer: BC Managed Care – PPO

## 2020-09-21 ENCOUNTER — Encounter: Payer: Self-pay | Admitting: Family Medicine

## 2020-09-21 ENCOUNTER — Other Ambulatory Visit: Payer: Self-pay

## 2020-09-21 ENCOUNTER — Ambulatory Visit: Payer: BC Managed Care – PPO | Admitting: Family Medicine

## 2020-09-21 VITALS — BP 112/66 | HR 67 | Temp 98.2°F | Ht 65.0 in | Wt 182.0 lb

## 2020-09-21 DIAGNOSIS — F419 Anxiety disorder, unspecified: Secondary | ICD-10-CM | POA: Diagnosis not present

## 2020-09-21 DIAGNOSIS — N951 Menopausal and female climacteric states: Secondary | ICD-10-CM | POA: Diagnosis not present

## 2020-09-21 DIAGNOSIS — R5383 Other fatigue: Secondary | ICD-10-CM

## 2020-09-21 DIAGNOSIS — M25512 Pain in left shoulder: Secondary | ICD-10-CM

## 2020-09-21 DIAGNOSIS — F410 Panic disorder [episodic paroxysmal anxiety] without agoraphobia: Secondary | ICD-10-CM

## 2020-09-21 MED ORDER — MELOXICAM 15 MG PO TABS: 15.0000 mg | ORAL_TABLET | Freq: Every day | ORAL | 1 refills | Status: DC

## 2020-09-21 MED ORDER — PAROXETINE HCL 10 MG PO TABS: 10.0000 mg | ORAL_TABLET | Freq: Every day | ORAL | 2 refills | Status: DC

## 2020-09-21 MED ORDER — PANTOPRAZOLE SODIUM 40 MG PO TBEC
40.0000 mg | DELAYED_RELEASE_TABLET | Freq: Every day | ORAL | 1 refills | Status: DC
Start: 2020-09-21 — End: 2021-08-27

## 2020-09-21 MED ORDER — CITALOPRAM HYDROBROMIDE 40 MG PO TABS
40.0000 mg | ORAL_TABLET | Freq: Every day | ORAL | 1 refills | Status: DC
Start: 2020-09-21 — End: 2021-09-03

## 2020-09-21 NOTE — Progress Notes (Signed)
SUBJECTIVE Chief Complaint  Patient presents with  . Anxiety    Pt has lost of motivation and fatigue;    HPI: Brenda Ray is a 50 y.o. female present for [facility] follow up.  Anxiety/hot flashes/fatigue:  Patient reports she does not feel the Celexa 40 mg is controlling her symptoms as well in the past.  She feels stressed out at work and has increased anxiety and depression feelings.  She reports she tried taking the Ativan but it makes her very sleepy even at low dose.  He does not feel the Celexa is working on her hot flashes any longer either. She also endorses increased fatigue.  She states that the end of the day she is feels like she has absolutely no energy and her body is drained.  She states her muscles feel achy and weak.   Shoulder pain: She is taking mobic Qd.  Patient reports Mobic is working well for her shoulder pain  ROS: See pertinent positives and negatives per HPI.  Depression screen [facility] 2/9 09/21/2020 06/29/2019 06/08/2018 11/03/2017 10/05/2017  Decreased Interest 1 0 0 0 1  Down, Depressed, Hopeless 1 0 0 0 0  PHQ - 2 Score 2 0 0 0 1  Altered sleeping 2 - - - 1  Tired, decreased energy 3 - - - 1  Change in appetite 3 - - - 1  Feeling bad or failure about yourself  0 - - - 0  Trouble concentrating 0 - - - 0  Moving slowly or fidgety/restless 1 - - - 0  Suicidal thoughts 0 - - - 0  PHQ-9 Score 11 - - - 4   GAD 7 : Generalized Anxiety Score 09/21/2020 01/17/2020 06/29/2019 11/30/2018  Nervous, Anxious, on Edge 2 0 0 0  Control/stop worrying 2 0 0 0  Worry too much - different things 2 0 0 0  Trouble relaxing 1 0 0 0  Restless 1 0 0 0  Easily annoyed or irritable 1 0 0 0  Afraid - awful might happen 0 0 0 0  Total GAD 7 Score 9 0 0 0  Anxiety Difficulty - Not difficult at all Not difficult at all Not difficult at all     Patient Active Problem List   Diagnosis Date Noted  . GERD without esophagitis 08/22/2020  . Nausea 08/22/2020  . Obesity (BMI 30-39.9)  07/05/2019  . Weight gain 07/05/2019  . Other fatigue 07/05/2019  . Acute pain of left shoulder 08/02/2018  . Benzodiazepine contract exists 10/05/2017  . Anxiety 10/02/2017  . Hot flashes due to menopause 10/02/2017  . Stress incontinence 10/01/2015    Social History   Tobacco Use  . Smoking status: Former Smoker    Packs/day: 0.50    Years: 36.00    Pack years: 18.00    Types: Cigarettes, E-cigarettes    Quit date: 02/25/2017    Years since quitting: 3.5  . Smokeless tobacco: Former Network engineer Use Topics  . Alcohol use: Not Currently    Current Outpatient Medications:  .  cetirizine (ZYRTEC) 10 MG tablet, Take 1 tablet (10 mg total) by mouth at bedtime., Disp: 90 tablet, Rfl: 3 .  citalopram (CELEXA) 40 MG tablet, Take 1 tablet (40 mg total) by mouth daily., Disp: 90 tablet, Rfl: 1 .  fluticasone (FLONASE) 50 MCG/ACT nasal spray, Place 2 sprays into both nostrils daily., Disp: 16 g, Rfl: 6 .  LORazepam (ATIVAN) 0.5 MG tablet, Take 1 tablet (  0.5 mg total) by mouth 2 (two) times daily as needed for anxiety., Disp: 30 tablet, Rfl: 2 .  meloxicam (MOBIC) 15 MG tablet, Take 1 tablet (15 mg total) by mouth daily., Disp: 90 tablet, Rfl: 1 .  pantoprazole (PROTONIX) 40 MG tablet, Take 1 tablet (40 mg total) by mouth 2 (two) times daily., Disp: 60 tablet, Rfl: 3  Allergies  Allergen Reactions  . Augmentin [Amoxicillin-Pot Clavulanate] Diarrhea  . Doxycycline Nausea And Vomiting  . Hydrocodone Itching    OBJECTIVE: BP 112/66   Pulse 67   Temp 98.2 F (36.8 C) (Oral)   Ht 5' 5"  (1.651 m)   Wt 182 lb (82.6 kg)   SpO2 97%   BMI 30.29 kg/m  Gen: Afebrile. No acute distress.  Nontoxic.  Very pleasant female. HENT: AT. [place].  Eyes:Pupils Equal Round Reactive to light, Extraocular movements intact,  Conjunctiva without redness, discharge or icterus. CV: RRR Chest: CTAB, no wheeze or crackles Neuro: Normal gait. PERLA. EOMi. Alert. Oriented x3 Psych: Normal affect, dress and  demeanor. Normal speech. Normal thought content and judgment.  ASSESSMENT AND PLAN: Brenda Ray is a 50 y.o. female present for  Anxiety/Hot flashes due to menopauseBenzodiazepine contract exists/panic Discussed different options for treatment with her today.  She would like to try to stay on the Celexa and add a medication to her regimen. -Continue celexa 40 mg QD  -Start Paxil 10 mg nightly - continue ativan 0.5 mg BID PRN. Uses very infrequently.  Does not need refills today. - [place] reviewed 09/21/20 - f/u 6 weeks..   Fatigue: Possibly multifactorial with worsening depression/anxiety.  Will rule out other potential causes. CBC, CMP, TSH, vitamin D, B12, iron panel  Acute pain of left shoulder stable Continue Mobic 15 mg daily.      Orders Placed This Encounter  Procedures  . Vitamin B12  . CBC w/Diff  . TSH  . T4, free  . Iron, TIBC and Ferritin Panel  . Comp Met (CMET)  . Vitamin D (25 hydroxy)   No orders of the defined types were placed in this encounter.  Referral Orders  No referral(s) requested today      Howard Pouch, DO 09/21/2020

## 2020-09-21 NOTE — Patient Instructions (Signed)

## 2020-09-22 LAB — VITAMIN D 25 HYDROXY (VIT D DEFICIENCY, FRACTURES): Vit D, 25-Hydroxy: 23 ng/mL — ABNORMAL LOW (ref 30–100)

## 2020-09-22 LAB — CBC WITH DIFFERENTIAL/PLATELET
Absolute Monocytes: 469 cells/uL (ref 200–950)
Basophils Absolute: 41 cells/uL (ref 0–200)
Basophils Relative: 0.6 %
Eosinophils Absolute: 143 cells/uL (ref 15–500)
Eosinophils Relative: 2.1 %
HCT: 38.5 % (ref 35.0–45.0)
Hemoglobin: 13.7 g/dL (ref 11.7–15.5)
Lymphs Abs: 2577 cells/uL (ref 850–3900)
MCH: 30.5 pg (ref 27.0–33.0)
MCHC: 35.6 g/dL (ref 32.0–36.0)
MCV: 85.7 fL (ref 80.0–100.0)
MPV: 9.6 fL (ref 7.5–12.5)
Monocytes Relative: 6.9 %
Neutro Abs: 3570 cells/uL (ref 1500–7800)
Neutrophils Relative %: 52.5 %
Platelets: 249 10*3/uL (ref 140–400)
RBC: 4.49 10*6/uL (ref 3.80–5.10)
RDW: 13 % (ref 11.0–15.0)
Total Lymphocyte: 37.9 %
WBC: 6.8 10*3/uL (ref 3.8–10.8)

## 2020-09-22 LAB — COMPREHENSIVE METABOLIC PANEL
AG Ratio: 1.6 (calc) (ref 1.0–2.5)
ALT: 18 U/L (ref 6–29)
AST: 14 U/L (ref 10–35)
Albumin: 4.4 g/dL (ref 3.6–5.1)
Alkaline phosphatase (APISO): 53 U/L (ref 37–153)
BUN: 10 mg/dL (ref 7–25)
CO2: 29 mmol/L (ref 20–32)
Calcium: 9.5 mg/dL (ref 8.6–10.4)
Chloride: 101 mmol/L (ref 98–110)
Creat: 0.96 mg/dL (ref 0.50–1.05)
Globulin: 2.8 g/dL (calc) (ref 1.9–3.7)
Glucose, Bld: 75 mg/dL (ref 65–99)
Potassium: 4.1 mmol/L (ref 3.5–5.3)
Sodium: 138 mmol/L (ref 135–146)
Total Bilirubin: 0.6 mg/dL (ref 0.2–1.2)
Total Protein: 7.2 g/dL (ref 6.1–8.1)

## 2020-09-22 LAB — IRON,TIBC AND FERRITIN PANEL
%SAT: 23 % (calc) (ref 16–45)
Ferritin: 75 ng/mL (ref 16–232)
Iron: 81 ug/dL (ref 45–160)
TIBC: 346 mcg/dL (calc) (ref 250–450)

## 2020-09-22 LAB — T4, FREE: Free T4: 1 ng/dL (ref 0.8–1.8)

## 2020-09-22 LAB — VITAMIN B12: Vitamin B-12: 357 pg/mL (ref 200–1100)

## 2020-09-22 LAB — TSH: TSH: 2.24 mIU/L

## 2020-09-24 ENCOUNTER — Other Ambulatory Visit: Payer: Self-pay | Admitting: Family Medicine

## 2020-09-24 MED ORDER — VITAMIN D (ERGOCALCIFEROL) 1.25 MG (50000 UNIT) PO CAPS: 50000.0000 [IU] | ORAL_CAPSULE | ORAL | 0 refills | Status: DC

## 2020-10-09 ENCOUNTER — Ambulatory Visit: Payer: BC Managed Care – PPO

## 2020-10-23 ENCOUNTER — Ambulatory Visit
Admission: RE | Admit: 2020-10-23 | Discharge: 2020-10-23 | Disposition: A | Discharge status: Home / Self Care | Payer: BC Managed Care – PPO | Source: Ambulatory Visit | Attending: Family Medicine | Admitting: Family Medicine

## 2020-10-23 ENCOUNTER — Other Ambulatory Visit: Payer: Self-pay | Admitting: Family Medicine

## 2020-10-23 ENCOUNTER — Other Ambulatory Visit: Payer: Self-pay

## 2020-10-23 DIAGNOSIS — N63 Unspecified lump in unspecified breast: Secondary | ICD-10-CM

## 2020-10-23 DIAGNOSIS — Z0000 Encounter for general adult medical examination without abnormal findings: Secondary | ICD-10-CM

## 2020-11-02 ENCOUNTER — Telehealth (INDEPENDENT_AMBULATORY_CARE_PROVIDER_SITE_OTHER): Payer: BC Managed Care – PPO | Admitting: Family Medicine

## 2020-11-02 ENCOUNTER — Encounter: Payer: Self-pay | Admitting: Family Medicine

## 2020-11-02 DIAGNOSIS — F419 Anxiety disorder, unspecified: Secondary | ICD-10-CM | POA: Diagnosis not present

## 2020-11-02 MED ORDER — BUSPIRONE HCL 10 MG PO TABS: 10.0000 mg | ORAL_TABLET | Freq: Two times a day (BID) | ORAL | 2 refills | Status: DC

## 2020-11-02 MED ORDER — FLUTICASONE PROPIONATE 50 MCG/ACT NA SUSP
2.0000 | Freq: Every day | NASAL | 6 refills | Status: DC
Start: 2020-11-02 — End: 2021-08-27

## 2020-11-02 NOTE — Progress Notes (Addendum)
VIRTUAL VISIT VIA VIDEO  I connected with Brenda Ray on 11/05/20 at 11:00 AM EDT by a video enabled telemedicine application and verified that I am speaking with the correct person using two identifiers. Location patient: Home Location provider: [facility], Office Persons participating in the virtual visit: Patient, Dr. Claiborne Billings and Les Pou, CMA  I discussed the limitations of evaluation and management by telemedicine and the availability of in person appointments. The patient expressed understanding and agreed to proceed.     SUBJECTIVE Chief Complaint  Patient presents with  . Anxiety    F/u    HPI: Brenda Ray is a 50 y.o. female present for [facility] follow up.  Anxiety/hot flashes/fatigue:  Patient reports the paxil did not work well for her and made her feel sleepy. She endorses feeling she is ok right now on the  Celexa 40 mg only. She also endorses she is having increased stress at work, with her Dad's health and her mother moving in with her.She reports she tried taking the Ativan but it makes her very sleepy even at low dose.   She thinks the vitamin supplements have helped with the fatigue.    Shoulder pain: She is taking mobic Qd.  Patient reports Mobic is working well for her shoulder pain  ROS: See pertinent positives and negatives per HPI.  Depression screen [facility] 2/9 11/02/2020 09/21/2020 06/29/2019 06/08/2018 11/03/2017  Decreased Interest 0 1 0 0 0  Down, Depressed, Hopeless 1 1 0 0 0  PHQ - 2 Score 1 2 0 0 0  Altered sleeping 0 2 - - -  Tired, decreased energy 0 3 - - -  Change in appetite 0 3 - - -  Feeling bad or failure about yourself  0 0 - - -  Trouble concentrating 0 0 - - -  Moving slowly or fidgety/restless 0 1 - - -  Suicidal thoughts 0 0 - - -  PHQ-9 Score 1 11 - - -   GAD 7 : Generalized Anxiety Score 11/02/2020 09/21/2020 01/17/2020 06/29/2019  Nervous, Anxious, on Edge 1 2 0 0  Control/stop worrying 0 2 0 0  Worry too much - different  things 1 2 0 0  Trouble relaxing 0 1 0 0  Restless 0 1 0 0  Easily annoyed or irritable 0 1 0 0  Afraid - awful might happen 0 0 0 0  Total GAD 7 Score 2 9 0 0  Anxiety Difficulty - - Not difficult at all Not difficult at all     Patient Active Problem List   Diagnosis Date Noted  . GERD without esophagitis 08/22/2020  . Nausea 08/22/2020  . Obesity (BMI 30-39.9) 07/05/2019  . Weight gain 07/05/2019  . Other fatigue 07/05/2019  . Acute pain of left shoulder 08/02/2018  . Benzodiazepine contract exists 10/05/2017  . Anxiety 10/02/2017  . Hot flashes due to menopause 10/02/2017  . Stress incontinence 10/01/2015    Social History   Tobacco Use  . Smoking status: Former Smoker    Packs/day: 0.50    Years: 36.00    Pack years: 18.00    Types: Cigarettes, E-cigarettes    Quit date: 02/25/2017    Years since quitting: 3.6  . Smokeless tobacco: Former Engineer, water Use Topics  . Alcohol use: Not Currently    Current Outpatient Medications:  .  busPIRone (BUSPAR) 10 MG tablet, Take 1 tablet (10 mg total) by mouth 2 (two)  times daily., Disp: 60 tablet, Rfl: 2 .  cetirizine (ZYRTEC) 10 MG tablet, Take 1 tablet (10 mg total) by mouth at bedtime., Disp: 90 tablet, Rfl: 3 .  citalopram (CELEXA) 40 MG tablet, Take 1 tablet (40 mg total) by mouth daily., Disp: 90 tablet, Rfl: 1 .  LORazepam (ATIVAN) 0.5 MG tablet, Take 1 tablet (0.5 mg total) by mouth 2 (two) times daily as needed for anxiety., Disp: 30 tablet, Rfl: 2 .  meloxicam (MOBIC) 15 MG tablet, Take 1 tablet (15 mg total) by mouth daily., Disp: 90 tablet, Rfl: 1 .  pantoprazole (PROTONIX) 40 MG tablet, Take 1 tablet (40 mg total) by mouth daily., Disp: 90 tablet, Rfl: 1 .  Vitamin D, Ergocalciferol, (DRISDOL) 1.25 MG (50000 UNIT) CAPS capsule, Take 1 capsule (50,000 Units total) by mouth every 7 (seven) days., Disp: 12 capsule, Rfl: 0 .  fluticasone (FLONASE) 50 MCG/ACT nasal spray, Place 2 sprays into both nostrils daily.,  Disp: 16 g, Rfl: 6  Allergies  Allergen Reactions  . Augmentin [Amoxicillin-Pot Clavulanate] Diarrhea  . Doxycycline Nausea And Vomiting  . Hydrocodone Itching    OBJECTIVE: There were no vitals taken for this visit. Gen: Afebrile. No acute distress. Nontoxic, pleasant female.  HENT: AT. [place].  Eyes:Pupils Equal Round Reactive to light, Extraocular movements intact,  Conjunctiva without redness, discharge or icterus. Neuro: PERLA. EOMi. Alert. Oriented x3 Psych: Normal affect, dress and demeanor. Normal speech. Normal thought content and judgment.   ASSESSMENT AND PLAN: Brenda Ray is a 50 y.o. female present for  Anxiety/Hot flashes due to menopauseBenzodiazepine contract exists/panic Stable, but still stressed and open to trying a different medication than paxil. PHQ and GAD much improved.  -Continue celexa 40 mg QD  Dc paxil Start buspar taper to 10 mg BID.  F/u 5.5 mos, sooner if needed.  - continue ativan 0.5 mg BID PRN. Uses very infrequently.  Does not need refills today. - [facility] reviewed 11/02/20 -f/u 5.5 months.  Fatigue: Possibly multifactorial with worsening depression/anxiety.  Will rule out other potential causes. Improved.  Continue vitamin supplements.   Acute pain of left shoulder Stable Continue Mobic 15 mg daily.   No orders of the defined types were placed in this encounter.  Meds ordered this encounter  Medications  . busPIRone (BUSPAR) 10 MG tablet    Sig: Take 1 tablet (10 mg total) by mouth 2 (two) times daily.    Dispense:  60 tablet    Refill:  2  . fluticasone (FLONASE) 50 MCG/ACT nasal spray    Sig: Place 2 sprays into both nostrils daily.    Dispense:  16 g    Refill:  6   Referral Orders  No referral(s) requested today      Felix Pacini, DO 11/02/2020

## 2020-12-05 ENCOUNTER — Ambulatory Visit
Admission: RE | Admit: 2020-12-05 | Discharge: 2020-12-05 | Disposition: A | Discharge status: Home / Self Care | Payer: BC Managed Care – PPO | Source: Ambulatory Visit | Attending: Family Medicine | Admitting: Family Medicine

## 2020-12-05 ENCOUNTER — Other Ambulatory Visit: Payer: Self-pay

## 2020-12-05 DIAGNOSIS — N63 Unspecified lump in unspecified breast: Secondary | ICD-10-CM

## 2020-12-05 DIAGNOSIS — N6323 Unspecified lump in the left breast, lower outer quadrant: Secondary | ICD-10-CM | POA: Diagnosis not present

## 2020-12-05 DIAGNOSIS — R922 Inconclusive mammogram: Secondary | ICD-10-CM | POA: Diagnosis not present

## 2021-01-02 ENCOUNTER — Ambulatory Visit: Payer: BC Managed Care – PPO | Admitting: Family Medicine

## 2021-01-02 ENCOUNTER — Encounter: Payer: Self-pay | Admitting: Family Medicine

## 2021-01-02 ENCOUNTER — Other Ambulatory Visit: Payer: Self-pay | Admitting: Family Medicine

## 2021-01-02 ENCOUNTER — Other Ambulatory Visit: Payer: Self-pay

## 2021-01-02 VITALS — BP 139/69 | HR 75 | Temp 98.1°F | Wt 184.0 lb

## 2021-01-02 DIAGNOSIS — R519 Headache, unspecified: Secondary | ICD-10-CM | POA: Diagnosis not present

## 2021-01-02 DIAGNOSIS — W57XXXA Bitten or stung by nonvenomous insect and other nonvenomous arthropods, initial encounter: Secondary | ICD-10-CM | POA: Diagnosis not present

## 2021-01-02 DIAGNOSIS — M791 Myalgia, unspecified site: Secondary | ICD-10-CM | POA: Diagnosis not present

## 2021-01-02 MED ORDER — AZITHROMYCIN 500 MG PO TABS: 500.0000 mg | ORAL_TABLET | Freq: Every day | ORAL | 0 refills | Status: DC

## 2021-01-02 NOTE — Progress Notes (Signed)
This visit occurred during the SARS-CoV-2 public health emergency.  Safety protocols were in place, including screening questions prior to the visit, additional usage of staff PPE, and extensive cleaning of exam room while observing appropriate contact time as indicated for disinfecting solutions.    Brenda Ray , 1971-06-19, 50 y.o., female MRN: [id number] Patient Care Team    Relationship Specialty Notifications Start End  Natalia Leatherwood, DO PCP - General Family Medicine  05/01/15     Chief Complaint  Patient presents with  . Insect Bite    Pt reports tick bite pulled off skin last Wed.; pt c/o fatigue, HA, dry eyes starting 4 days ago     Subjective: Pt presents for an OV with complaints of headache, fatigue, dry eyes, and mylagia of 4 days duration.  Associated symptoms include tick exposure 6/1. Pt reports she had a tick embedded on the right side of her neck. Her SO was able to remove tick intact. She reports it had to be there more 48 hours.    Depression screen [facility] 2/9 11/02/2020 09/21/2020 06/29/2019 06/08/2018 11/03/2017  Decreased Interest 0 1 0 0 0  Down, Depressed, Hopeless 1 1 0 0 0  PHQ - 2 Score 1 2 0 0 0  Altered sleeping 0 2 - - -  Tired, decreased energy 0 3 - - -  Change in appetite 0 3 - - -  Feeling bad or failure about yourself  0 0 - - -  Trouble concentrating 0 0 - - -  Moving slowly or fidgety/restless 0 1 - - -  Suicidal thoughts 0 0 - - -  PHQ-9 Score 1 11 - - -    Allergies  Allergen Reactions  . Augmentin [Amoxicillin-Pot Clavulanate] Diarrhea  . Doxycycline Nausea And Vomiting  . Hydrocodone Itching   Social History   Social History Narrative   Lives with Son and significant other. Engaged to be married.   Bus Arts administrator at the [employer]   Every day smoker   No etoh. No drugs. Wears her seatbelt.    Does not exercise daily.    Firearms present in the home in a gun safe.   Smoke alarm in the home.    Feel safe in  relationships.    Past Medical History:  Diagnosis Date  . Abnormal Pap smear of cervix    age 30  . Anemia   . Anxiety    use to use / take Lorazepam ., last episode of panic  2 yrs. ago  . Arthritis    hands, knees, back - Osteo  . Colon polyps    she reports colon polyps about 20 years ago; she was told 5 yr follow up.   . Complication of anesthesia    woke up before the tube was removed   . Cystitis   . Facial nerve disorder   . GERD (gastroesophageal reflux disease)   . Hiatal hernia    reports seen on [facility] completed in her 20s [facility]. Completed for pain and FOBT +.   . History of chicken pox   . Pneumonia    as a child- hosp.   . Vaginal delivery    x2   Past Surgical History:  Procedure Laterality Date  . ANTERIOR CERVICAL DECOMP/DISCECTOMY FUSION  2017   C3-C5  . BREAST BIOPSY Left 2014   fatty tissue only  . CERVICAL DISCECTOMY  2014  . CHOLECYSTECTOMY N/A 06/16/2014  Procedure: LAPAROSCOPIC CHOLECYSTECTOMY ;  Surgeon: Abigail Miyamoto, MD;  Location: [facility] OR;  Service: General;  Laterality: N/A;  laparoscopic cholecystectomy  . GYNECOLOGIC CRYOSURGERY     age 53  . LAPAROSCOPIC ABDOMINAL EXPLORATION     for endometrimosis    Family History  Problem Relation Age of Onset  . Arthritis Mother   . Hyperlipidemia Mother   . Hypertension Mother   . Thyroid disease Mother        hypothyroid  . Arthritis Father   . Hyperlipidemia Father   . Hypertension Father   . Bladder Cancer Father   . Prostate cancer Father   . Thyroid disease Brother   . Arthritis Maternal Grandmother   . Arthritis Maternal Grandfather   . Arthritis Paternal Grandmother   . Arthritis Paternal Grandfather   . Breast cancer Maternal Aunt 50   Allergies as of 01/02/2021      Reactions   Augmentin [amoxicillin-pot Clavulanate] Diarrhea   Doxycycline Nausea And Vomiting   Hydrocodone Itching      Medication List       Accurate as of January 02, 2021  8:58 AM. If you have any  questions, ask your nurse or doctor.        STOP taking these medications   Vitamin D (Ergocalciferol) 1.25 MG (50000 UNIT) Caps capsule Commonly known as: DRISDOL Stopped by: Felix Pacini, DO     TAKE these medications   azithromycin 500 MG tablet Commonly known as: ZITHROMAX Take 1 tablet (500 mg total) by mouth daily. Take 2 tablets on day 1, then 1 tablet daily on days 2 through 5 Started by: Felix Pacini, DO   busPIRone 10 MG tablet Commonly known as: BUSPAR Take 1 tablet (10 mg total) by mouth 2 (two) times daily.   cetirizine 10 MG tablet Commonly known as: ZYRTEC Take 1 tablet (10 mg total) by mouth at bedtime.   citalopram 40 MG tablet Commonly known as: CELEXA Take 1 tablet (40 mg total) by mouth daily.   fluticasone 50 MCG/ACT nasal spray Commonly known as: FLONASE Place 2 sprays into both nostrils daily.   LORazepam 0.5 MG tablet Commonly known as: ATIVAN Take 1 tablet (0.5 mg total) by mouth 2 (two) times daily as needed for anxiety.   meloxicam 15 MG tablet Commonly known as: MOBIC Take 1 tablet (15 mg total) by mouth daily.   pantoprazole 40 MG tablet Commonly known as: PROTONIX Take 1 tablet (40 mg total) by mouth daily.   Vitamin D-3 25 MCG (1000 UT) Caps Take by mouth.       All past medical history, surgical history, allergies, family history, immunizations andmedications were updated in the EMR today and reviewed under the history and medication portions of their EMR.     ROS: Negative, with the exception of above mentioned in HPI   Objective:  BP 139/69   Pulse 75   Temp 98.1 F (36.7 C) (Oral)   Wt 184 lb (83.5 kg)   SpO2 98%   BMI 30.62 kg/m  Body mass index is 30.62 kg/m. Gen: Afebrile. No acute distress. Nontoxic in appearance, well developed, well nourished.  HENT: AT. .  Eyes:Pupils Equal Round Reactive to light, Extraocular movements intact,  Conjunctiva without redness, discharge or icterus. Neck/lymp/endocrine:  Supple,no lymphadenopathy CV: RRR no murmur, no edema Chest: CTAB, no wheeze or crackles. Good air movement, normal resp effort.  Skin: no rashes, purpura or petechiae. Site does not appear to have FB. Tick appears completely  extracted.No redness of swelling.  Neuro: Normal gait. PERLA. EOMi. Alert. Oriented x3    No exam data present No results found. No results found for this or any previous visit (from the past 24 hour(s)).  Assessment/Plan: Brenda Ray is a 50 y.o. female present for OV for  Tick bite, unspecified site, initial encounter/Nonintractable headache, unspecified chronicity pattern, unspecified headache type/myalgia Tick had been embedded and attached > 48 hrs, in a symptomatic pt> elected to treat with azith 500 x10days. Abx is not 1st choice, however pt becomes severely ill with 1st and 2nd choice abx. For lyme.  She is aware if her RMSF titre returns +, then we will need to tx with doxy.  Labs appt only in 5 weeks - B. burgdorfi antibodies; Future - Rocky mtn spotted fvr abs pnl(IgG+IgM); Future NSAIDS for relief.  Monitor for rash, fever, joint swelling Work note provided for today.  AVS education on RMSF and Lyme    Reviewed expectations re: course of current medical issues.  Discussed self-management of symptoms.  Outlined signs and symptoms indicating need for more acute intervention.  Patient verbalized understanding and all questions were answered.  Patient received an After-Visit Summary.    Orders Placed This Encounter  Procedures  . B. burgdorfi antibodies  . [place] mtn spotted fvr abs pnl(IgG+IgM)   Meds ordered this encounter  Medications  . azithromycin (ZITHROMAX) 500 MG tablet    Sig: Take 1 tablet (500 mg total) by mouth daily. Take 2 tablets on day 1, then 1 tablet daily on days 2 through 5    Dispense:  10 tablet    Refill:  0   Referral Orders  No referral(s) requested today     Note is dictated utilizing voice recognition  software. Although note has been proof read prior to signing, occasional typographical errors still can be missed. If any questions arise, please do not hesitate to call for verification.   electronically signed by:  Felix Pacini, DO  [facility] - [place]

## 2021-01-02 NOTE — Patient Instructions (Addendum)
Start azith one tab daily for 10 days.  Watch for any rash development  Lab appt around 7/13 for titre.    [place] Spotted Fever [place] spotted fever is a bacterial infection that spreads to people through contact with certain ticks. The illness causes flu-like symptoms and a reddish-purple rash. The illness does not spread from person to person (is not contagious). When this condition is not treated right away, it can quickly become very serious, and can sometimes lead to long-term (chronic) health problems or even death. What are the causes? This condition is caused by a type of bacteria (Rickettsia rickettsii) that is carried by [place] dog ticks, brown dog ticks, and [employer] wood ticks. The infection spreads through:  A bite from an infected tick. Tick bites are usually painless, and they frequently are not noticed.  Infected tick blood, body fluids, or feces that get into the body through damaged skin, such as a small cut or sore. This could happen while removing a tick from a pet or from another person. What increases the risk? The following factors may make you more likely to develop this condition:  Spending a lot of time outdoors, especially in rural areas or areas with long grass.  Spending time outdoors during warm weather. Ticks are most active during warm weather. What are the signs or symptoms? Symptoms of this condition include:  Fever.  Muscle aches.  Headache.  Nausea.  Vomiting.  Poor appetite.  Abdominal pain.  A reddish-purple rash. ? This usually appears 2-5 days after the first symptoms begin. ? The rash often starts on the wrists, forearms, and ankles. It may then spread to the palms, the bottom of the feet, legs, and trunk. Symptoms may develop 2-14 days after a tick bite.   How is this diagnosed? This condition is diagnosed based on:  Your medical history.  A physical exam.  Blood tests.  Whether you have recently been  bitten by a tick or spent time in areas where: ? Ticks are common. ? [facility] [address] spotted fever is common. How is this treated? This condition is treated with antibiotic medicines. It is important to begin treatment right away. In some cases, your health care provider may begin treatment before the diagnosis is confirmed. If your symptoms are severe, you may need to be treated in the hospital where you can get antibiotics and be monitored during treatment. Follow these instructions at home:  Take over-the-counter and prescription medicines only as told by your health care provider.  Take your antibiotic medicine as told by your health care provider. Do not stop taking the antibiotic even if you start to feel better.  Rest as much as possible until you feel better. Return to your normal activities as told by your health care provider.  Drink enough fluid to keep your urine pale yellow.  Keep all follow-up visits as told by your health care provider. This is important. Contact a health care provider if:  You have a rash that gets increasingly red or swollen.  You have fluid draining from any areas of your rash. Get help right away if:  You develop a fever after being bitten by a tick.  You develop a rash 2-5 days after experiencing flu-like symptoms.  You have chest pain.  You have shortness of breath.  You have a severe headache.  You have jerky movements you cannot control (seizure).  You have severe abdominal pain.  You feel confused.  You bruise easily.  You have bleeding from your gums.  You have blood in your stool (feces).  You have trouble controlling when you urinate or have bowel movements (incontinence).  You have vision problems.  You have numbness or tingling in your arms or legs. Summary  [facility] spotted fever is a bacterial infection that spreads to people through contact with certain ticks.  When this condition is not treated right away, it  can quickly become very serious, and can sometimes lead to long-term (chronic) health problems or even death.  You are more likely to develop this infection if you spend time outdoors in warm weather and in areas with tall grass.  Symptoms of this condition include fever, headache, nausea, vomiting, abdominal pain, muscle aches, and a reddish-purple rash that usually appears 2-5 days after a fever.  This condition is treated with antibiotic medicines. This information is not intended to replace advice given to you by your health care provider. Make sure you discuss any questions you have with your health care provider. Document Revised: 07/17/2017 Document Reviewed: 10/09/2016 Elsevier Patient Education  2021 Elsevier Inc.  Lyme Disease Lyme disease is an infection that can affect many parts of the body, including the skin, joints, and nervous system. It is a bacterial infection that starts from the bite of an infected tick. Over time, the infection can worsen, and some of the symptoms are similar to the flu. If Lyme disease is not treated, it may cause joint pain, swelling, numbness, problems thinking, fatigue, muscle weakness, and other problems. What are the causes? This condition is caused by bacteria called Borrelia burgdorferi.  You can get Lyme disease by being bitten by an infected tick.  Only black-legged, or Ixodes, ticks that are infected with the bacteria can cause Lyme disease.  The tick must be attached to your skin for a certain period of time to pass along the infection. This is usually 36-48 hours.  Deer often carry infected ticks. What increases the risk? The following factors may make you more likely to develop this condition:  Living in or visiting these areas in the [place] ? [place]. ? [place]. ? [place].  Spending time in wooded or grassy areas.  Being outdoors with exposed skin.  Camping, gardening, hiking, fishing, hunting, or  working outdoors.  Failing to remove a tick from your skin. What are the signs or symptoms? Symptoms of this condition may include:  Chills and fever.  Headache.  Fatigue.  General achiness.  Muscle pain.  Joint pain, often in the knees.  A round, red rash that surrounds the center of the tick bite. The center of the rash may be blood colored or have tiny blisters.  Swollen lymph glands.  Stiff neck.   How is this diagnosed? This condition is diagnosed based on:  Your symptoms and medical history.  A physical exam.  A blood test. How is this treated? The main treatment for this condition is antibiotic medicine, which is usually taken by mouth (orally).  The length of treatment depends on how soon after a tick bite you begin taking the medicine. In some cases, treatment is necessary for several weeks.  If the infection is severe, antibiotics may need to be given through an IV that is inserted into one of your veins. Follow these instructions at home:  Take over-the-counter and prescription medicines only as told by your health care provider. Finish all antibiotic medicine, even when you start to feel better.  Ask  your health care provider about taking a probiotic in between doses of your antibiotic to help avoid an upset stomach or diarrhea.  Check with your health care provider before supplementing your treatment. Many alternative therapies have not been proven and may be harmful to you.  Keep all follow-up visits as told by your health care provider. This is important. How is this prevented? You can become reinfected if you get another tick bite from an infected tick. Take these steps to help prevent an infection:  Cover your skin with light-colored clothing when you are outdoors in the spring and summer months.  Spray clothing and skin with bug spray. The spray should be 20-30% DEET. You can also treat clothing with permethrin, and let it dry before you wear it.  Do not apply permethrin directly to your skin. Permethrin can also be used to treat camping gear and boots. Always read and follow the instructions that come with a bug spray or insecticide.  Avoid wooded, grassy, and shaded areas.  Remove yard litter, brush, trash, and plants that attract deer and rodents.  Check yourself for ticks when you come indoors.  Wash clothing worn each day.  Shower after spending time outdoors.  Check your pets for ticks before they come inside.  If you find a tick attached to your skin: ? Remove it with tweezers. ? Clean your hands and the bite area with rubbing alcohol or soap and water. ? Dispose of the tick by putting it in rubbing alcohol, putting it in a sealed bag or container, or flushing it down the toilet. ? You may choose to save the tick in a sealed container if you wish for it to be tested at a later time. Pregnant women should take special care to avoid tick bites because it is possible that the infection may be passed along to the fetus.   Contact a health care provider if:  You have symptoms after treatment.  You have removed a tick and want to bring it to your health care provider for testing. Get help right away if:  You have an irregular heartbeat.  You have chest pain.  You have nerve pain.  Your face feels numb.  You develop the following: ? A stiff neck. ? A severe headache. ? Severe nausea and vomiting. ? Sensitivity to light. Summary  Lyme disease is an infection that can affect many parts of the body, including the skin, joints, and nervous system.  This condition is caused by bacteria called Borrelia burgdorferi.  You can get Lyme disease by being bitten by an infected tick.  The main treatment for this condition is antibiotic medicine. This information is not intended to replace advice given to you by your health care provider. Make sure you discuss any questions you have with your health care provider. Document  Revised: 11/05/2018 Document Reviewed: 09/30/2018 Elsevier Patient Education  2021 [employer].

## 2021-02-05 ENCOUNTER — Ambulatory Visit (INDEPENDENT_AMBULATORY_CARE_PROVIDER_SITE_OTHER): Payer: Self-pay | Admitting: Family Medicine

## 2021-02-05 ENCOUNTER — Other Ambulatory Visit: Payer: Self-pay

## 2021-02-05 ENCOUNTER — Encounter: Payer: Self-pay | Admitting: Family Medicine

## 2021-02-05 VITALS — BP 118/74 | HR 77 | Temp 98.3°F | Ht 65.0 in | Wt 184.0 lb

## 2021-02-05 DIAGNOSIS — L237 Allergic contact dermatitis due to plants, except food: Secondary | ICD-10-CM

## 2021-02-05 MED ORDER — HYDROXYZINE HCL 10 MG PO TABS: ORAL_TABLET | ORAL | 1 refills | Status: DC

## 2021-02-05 MED ORDER — METHYLPREDNISOLONE ACETATE 80 MG/ML IJ SUSP
80.0000 mg | Freq: Once | INTRAMUSCULAR | Status: AC
  Administered 2021-02-05: 80 mg via INTRAMUSCULAR

## 2021-02-05 NOTE — Patient Instructions (Signed)
Poison Ivy Dermatitis Poison ivy dermatitis is redness and soreness of the skin caused by chemicals in the leaves of the poison ivy plant. You may have very bad itching, swelling, a rash, and blisters. What are the causes? Touching a poison ivy plant. Touching something that has the chemical on it. This may include animals or objects that have come in contact with the plant. What increases the risk? Going outdoors often in wooded or marshy areas. Going outdoors without wearing protective clothing, such as closed shoes, long pants, and a long-sleeved shirt. What are the signs or symptoms?  Skin redness. Very bad itching. A rash that often includes bumps and blisters. The rash usually appears 48 hours after exposure, if you have been exposed before. If this is the first time you have been exposed, the rash may not appear until a week after exposure. Swelling. This may occur if the reaction is very bad. Symptoms usually last for 1-2 weeks. The first time you develop this condition, symptoms may last 3-4 weeks. How is this treated? This condition may be treated with: Hydrocortisone cream or calamine lotion to relieve itching. Oatmeal baths to soothe the skin. Medicines, such as over-the-counter antihistamine tablets. Oral steroid medicine for more severe reactions. Follow these instructions at home: Medicines Take or apply over-the-counter and prescription medicines only as told by your doctor. Use hydrocortisone cream or calamine lotion as needed to help with itching. General instructions Do not scratch or rub your skin. Put a cold, wet cloth (cold compress) on the affected areas or take baths in cool water. This will help with itching. Avoid hot baths and showers. Take oatmeal baths as needed. Use colloidal oatmeal. You can get this at a pharmacy or grocery store. Follow the instructions on the package. While you have the rash, wash your clothes right after you wear them. Keep all  follow-up visits as told by your health care provider. This is important. How is this prevented?  Know what poison ivy looks like, so you can avoid it. This plant has three leaves with flowering branches on a single stem. The leaves are glossy. The leaves have uneven edges that come to a point at the front. If you touch poison ivy, wash your skin with soap and water right away. Be sure to wash under your fingernails. When hiking or camping, wear long pants, a long-sleeved shirt, tall socks, and hiking boots. You can also use a lotion on your skin that helps to prevent contact with poison ivy. If you think that your clothes or outdoor gear came in contact with poison ivy, rinse them off with a garden hose before you bring them inside your house. When doing yard work or gardening, wear gloves, long sleeves, long pants, and boots. Wash your garden tools and gloves if they come in contact with poison ivy. If you think that your pet has come into contact with poison ivy, wash him or her with pet shampoo and water. Make sure to wear gloves while washing your pet. Contact a doctor if: You have open sores in the rash area. You have more redness, swelling, or pain in the rash area. You have redness that spreads beyond the rash area. You have fluid, blood, or pus coming from the rash area. You have a fever. You have a rash over a large area of your body. You have a rash on your eyes, mouth, or genitals. Your rash does not get better after a few weeks. Get help right away   if: Your face swells or your eyes swell shut. You have trouble breathing. You have trouble swallowing. These symptoms may be an emergency. Do not wait to see if the symptoms will go away. Get medical help right away. Call your local emergency services (911 in the [place] Do not drive yourself to the hospital. Summary Poison ivy dermatitis is redness and soreness of the skin caused by chemicals in the leaves of the poison ivy  plant. You may have skin redness, very bad itching, swelling, and a rash. Do not scratch or rub your skin. Take or apply over-the-counter and prescription medicines only as told by your doctor. This information is not intended to replace advice given to you by your health care provider. Make sure you discuss any questions you have with your health care provider. Document Revised: 11/05/2018 Document Reviewed: 07/09/2018 Elsevier Patient Education  2022 Elsevier Inc.  

## 2021-02-05 NOTE — Progress Notes (Signed)
This visit occurred during the SARS-CoV-2 public health emergency.  Safety protocols were in place, including screening questions prior to the visit, additional usage of staff PPE, and extensive cleaning of exam room while observing appropriate contact time as indicated for disinfecting solutions.    Brenda Ray , [birth year], 50 y.o., female MRN: [id number] Patient Care Team    Relationship Specialty Notifications Start End  Natalia Leatherwood, DO PCP - General Family Medicine  05/01/15     Chief Complaint  Patient presents with   Rash    Pt c/o rash on left leg and arm; pt reports rash burning, aching, itching, and red x 4 day     Subjective: Pt presents for an OV with complaints of red itchy, burning red rash with blisters of 4 days duration.  Rash is located behind her left knee, leg and arm. She is concerned it is shingles. She was working out in her yard.   Depression screen [facility] 2/9 11/02/2020 09/21/2020 06/29/2019 06/08/2018 11/03/2017  Decreased Interest 0 1 0 0 0  Down, Depressed, Hopeless 1 1 0 0 0  PHQ - 2 Score 1 2 0 0 0  Altered sleeping 0 2 - - -  Tired, decreased energy 0 3 - - -  Change in appetite 0 3 - - -  Feeling bad or failure about yourself  0 0 - - -  Trouble concentrating 0 0 - - -  Moving slowly or fidgety/restless 0 1 - - -  Suicidal thoughts 0 0 - - -  PHQ-9 Score 1 11 - - -    Allergies  Allergen Reactions   Augmentin [Amoxicillin-Pot Clavulanate] Diarrhea   Doxycycline Nausea And Vomiting   Hydrocodone Itching   Social History   Social History Narrative   Lives with Son and significant other. Engaged to be married.   Bus Arts administrator at the [employer]   Every day smoker   No etoh. No drugs. Wears her seatbelt.    Does not exercise daily.    Firearms present in the home in a gun safe.   Smoke alarm in the home.    Feel safe in relationships.    Past Medical History:  Diagnosis Date   Abnormal Pap smear of cervix    age 76    Anemia    Anxiety    use to use / take Lorazepam ., last episode of panic  2 yrs. ago   Arthritis    hands, knees, back - Osteo   Colon polyps    she reports colon polyps about 20 years ago; she was told 5 yr follow up.    Complication of anesthesia    woke up before the tube was removed    Cystitis    Facial nerve disorder    GERD (gastroesophageal reflux disease)    Hiatal hernia    reports seen on [facility] completed in her 20s [facility]. Completed for pain and FOBT +.    History of chicken pox    Pneumonia    as a child- hosp.    Vaginal delivery    x2   Past Surgical History:  Procedure Laterality Date   ANTERIOR CERVICAL DECOMP/DISCECTOMY FUSION  2017   C3-C5   BREAST BIOPSY Left 2014   fatty tissue only   CERVICAL DISCECTOMY  2014   CHOLECYSTECTOMY N/A 06/16/2014   Procedure: LAPAROSCOPIC CHOLECYSTECTOMY ;  Surgeon: Abigail Miyamoto, MD;  Location: [facility] OR;  Service:  General;  Laterality: N/A;  laparoscopic cholecystectomy   GYNECOLOGIC CRYOSURGERY     age 70   LAPAROSCOPIC ABDOMINAL EXPLORATION     for endometrimosis    Family History  Problem Relation Age of Onset   Arthritis Mother    Hyperlipidemia Mother    Hypertension Mother    Thyroid disease Mother        hypothyroid   Arthritis Father    Hyperlipidemia Father    Hypertension Father    Bladder Cancer Father    Prostate cancer Father    Thyroid disease Brother    Arthritis Maternal Grandmother    Arthritis Maternal Grandfather    Arthritis Paternal Grandmother    Arthritis Paternal Grandfather    Breast cancer Maternal Aunt 50   Allergies as of 02/05/2021       Reactions   Augmentin [amoxicillin-pot Clavulanate] Diarrhea   Doxycycline Nausea And Vomiting   Hydrocodone Itching        Medication List        Accurate as of February 05, 2021  8:41 AM. If you have any questions, ask your nurse or doctor.          busPIRone 10 MG tablet Commonly known as: BUSPAR Take 1 tablet (10 mg total) by  mouth 2 (two) times daily.   cetirizine 10 MG tablet Commonly known as: ZYRTEC Take 1 tablet (10 mg total) by mouth at bedtime.   citalopram 40 MG tablet Commonly known as: CELEXA Take 1 tablet (40 mg total) by mouth daily.   fluticasone 50 MCG/ACT nasal spray Commonly known as: FLONASE Place 2 sprays into both nostrils daily.   LORazepam 0.5 MG tablet Commonly known as: ATIVAN Take 1 tablet (0.5 mg total) by mouth 2 (two) times daily as needed for anxiety.   meloxicam 15 MG tablet Commonly known as: MOBIC Take 1 tablet (15 mg total) by mouth daily.   pantoprazole 40 MG tablet Commonly known as: PROTONIX Take 1 tablet (40 mg total) by mouth daily.   Vitamin D-3 25 MCG (1000 UT) Caps Take by mouth.        All past medical history, surgical history, allergies, family history, immunizations andmedications were updated in the EMR today and reviewed under the history and medication portions of their EMR.     ROS: Negative, with the exception of above mentioned in HPI   Objective:  BP 118/74   Pulse 77   Temp 98.3 F (36.8 C) (Oral)   Ht 5\' 5"  (1.651 m)   Wt 184 lb (83.5 kg)   SpO2 97%   BMI 30.62 kg/m  Body mass index is 30.62 kg/m. Gen: Afebrile. No acute distress. Nontoxic in appearance, well developed, well nourished.  HENT: AT. [place]. Eyes:Pupils Equal Round Reactive to light, Extraocular movements intact,  Conjunctiva without redness, discharge or icterus. Skin: erythema base patches with vesicles behind knee, calf and forearm (left) Neuro:Normal gait. PERLA. EOMi. Alert. Oriented x3  Psych: Normal affect, dress and demeanor. Normal speech. Normal thought content and judgment.  No results found. No results found. No results found for this or any previous visit (from the past 24 hour(s)).  Assessment/Plan: Brenda Ray is a 50 y.o. female present for OV for  Poison oak dermatitis Prevention discussed  IM depo medrol administered.  OTC benadryl gel for  itch Vistaril 5-20 mg TID PRN prescribed.  F/ U pRN  Reviewed expectations re: course of current medical issues. Discussed self-management of symptoms. Outlined signs and symptoms  indicating need for more acute intervention. Patient verbalized understanding and all questions were answered. Patient received an After-Visit Summary.    No orders of the defined types were placed in this encounter.  No orders of the defined types were placed in this encounter.  Referral Orders  No referral(s) requested today     Note is dictated utilizing voice recognition software. Although note has been proof read prior to signing, occasional typographical errors still can be missed. If any questions arise, please do not hesitate to call for verification.   electronically signed by:  Felix Pacini, DO  [facility] - [place]

## 2021-02-07 ENCOUNTER — Ambulatory Visit: Payer: BC Managed Care – PPO

## 2021-07-10 ENCOUNTER — Encounter: Payer: Self-pay | Admitting: Family Medicine

## 2021-07-10 ENCOUNTER — Other Ambulatory Visit: Payer: Self-pay

## 2021-07-10 ENCOUNTER — Telehealth (INDEPENDENT_AMBULATORY_CARE_PROVIDER_SITE_OTHER): Payer: Self-pay | Admitting: Family Medicine

## 2021-07-10 DIAGNOSIS — J209 Acute bronchitis, unspecified: Secondary | ICD-10-CM

## 2021-07-10 DIAGNOSIS — B9689 Other specified bacterial agents as the cause of diseases classified elsewhere: Secondary | ICD-10-CM

## 2021-07-10 DIAGNOSIS — J988 Other specified respiratory disorders: Secondary | ICD-10-CM

## 2021-07-10 MED ORDER — PREDNISONE 20 MG PO TABS: 40.0000 mg | ORAL_TABLET | Freq: Every day | ORAL | 0 refills | Status: DC

## 2021-07-10 MED ORDER — GUAIFENESIN-CODEINE 100-10 MG/5ML PO SOLN: 10.0000 mL | Freq: Four times a day (QID) | ORAL | 0 refills | Status: DC | PRN

## 2021-07-10 MED ORDER — CEFDINIR 300 MG PO CAPS: 300.0000 mg | ORAL_CAPSULE | Freq: Two times a day (BID) | ORAL | 0 refills | Status: DC

## 2021-07-10 NOTE — Progress Notes (Signed)
VIRTUAL VISIT VIA VIDEO  I connected with Brenda Ray on 07/10/21 at  4:00 PM EST by elemedicine application and verified that I am speaking with the correct person using two identifiers. Location patient: Home Location provider: [facility], Office Persons participating in the virtual visit: Patient, Dr. Claiborne Billings and Reece Agar. Cesar, CMA  I discussed the limitations of evaluation and management by telemedicine and the availability of in person appointments. The patient expressed understanding and agreed to proceed.   SUBJECTIVE Chief Complaint  Patient presents with   Fever    Pt c/o fever 100, HA, cough, and sore throat x a week     HPI: Brenda Ray is a 50 y.o. female present for acute illness of fever, headache, cough and sore throat of one week duration. Covid test negative at home. She is tolerating PO   ROS: See pertinent positives and negatives per HPI.  Patient Active Problem List   Diagnosis Date Noted   GERD without esophagitis 08/22/2020   Nausea 08/22/2020   Obesity (BMI 30-39.9) 07/05/2019   Weight gain 07/05/2019   Other fatigue 07/05/2019   Acute pain of left shoulder 08/02/2018   Benzodiazepine contract exists 10/05/2017   Anxiety 10/02/2017   Hot flashes due to menopause 10/02/2017   Stress incontinence 10/01/2015    Social History   Tobacco Use   Smoking status: Former    Packs/day: 0.50    Years: 36.00    Pack years: 18.00    Types: Cigarettes, E-cigarettes    Quit date: 02/25/2017    Years since quitting: 4.3   Smokeless tobacco: Former  Substance Use Topics   Alcohol use: Not Currently    Current Outpatient Medications:    cefdinir (OMNICEF) 300 MG capsule, Take 1 capsule (300 mg total) by mouth 2 (two) times daily., Disp: 20 capsule, Rfl: 0   cetirizine (ZYRTEC) 10 MG tablet, Take 1 tablet (10 mg total) by mouth at bedtime., Disp: 90 tablet, Rfl: 3   Cholecalciferol (VITAMIN D-3) 25 MCG (1000 UT) CAPS, Take by mouth., Disp: , Rfl:     citalopram (CELEXA) 40 MG tablet, Take 1 tablet (40 mg total) by mouth daily., Disp: 90 tablet, Rfl: 1   fluticasone (FLONASE) 50 MCG/ACT nasal spray, Place 2 sprays into both nostrils daily., Disp: 16 g, Rfl: 6   guaiFENesin-codeine 100-10 MG/5ML syrup, Take 10 mLs by mouth every 6 (six) hours as needed for cough., Disp: 120 mL, Rfl: 0   hydrOXYzine (ATARAX/VISTARIL) 10 MG tablet, 1/2 tab during the day and 2 tabs before bed. Can take every 8 hours as needed., Disp: 90 tablet, Rfl: 1   LORazepam (ATIVAN) 0.5 MG tablet, Take 1 tablet (0.5 mg total) by mouth 2 (two) times daily as needed for anxiety., Disp: 30 tablet, Rfl: 2   meloxicam (MOBIC) 15 MG tablet, Take 1 tablet (15 mg total) by mouth daily., Disp: 90 tablet, Rfl: 1   pantoprazole (PROTONIX) 40 MG tablet, Take 1 tablet (40 mg total) by mouth daily., Disp: 90 tablet, Rfl: 1  Allergies  Allergen Reactions   Augmentin [Amoxicillin-Pot Clavulanate] Diarrhea   Doxycycline Nausea And Vomiting   Hydrocodone Itching    OBJECTIVE: There were no vitals taken for this visit. Gen: No acute distress. Nontoxic in appearance.  HENT: AT. [place].  MMM.  Eyes:Pupils Equal Round Reactive to light, Extraocular movements intact,  Conjunctiva without redness, discharge or icterus. Chest: Cough present, no shortness of breath Skin: no rashes, purpura or petechiae.  Neuro:  Alert. Oriented x3  Psych: Normal affect and demeanor. Normal speech. Normal thought content and judgment.  ASSESSMENT AND PLAN: Brenda Ray is a 50 y.o. female present for  Acute bronchitis with symptoms > 10 days/Bacterial respiratory infection Rest, hydrate.  +/- flonase, mucinex (DM if cough), nettie pot or nasal saline.  Omnicef prescribed, take until completed.  IM depo medrol for nurse injection 07/11/2021. Robitussin AC prescribed for cough.  If cough present it can last up to 6-8 weeks.  F/U 2 weeks of not improved.   Felix Pacini, DO 07/10/2021   Return in about 2  weeks (around 07/24/2021), or if symptoms worsen or fail to improve.  No orders of the defined types were placed in this encounter.  Meds ordered this encounter  Medications   cefdinir (OMNICEF) 300 MG capsule    Sig: Take 1 capsule (300 mg total) by mouth 2 (two) times daily.    Dispense:  20 capsule    Refill:  0   DISCONTD: predniSONE (DELTASONE) 20 MG tablet    Sig: Take 2 tablets (40 mg total) by mouth daily with breakfast.    Dispense:  10 tablet    Refill:  0   guaiFENesin-codeine 100-10 MG/5ML syrup    Sig: Take 10 mLs by mouth every 6 (six) hours as needed for cough.    Dispense:  120 mL    Refill:  0    Not allergic- she has had before    Referral Orders  No referral(s) requested today

## 2021-07-10 NOTE — Patient Instructions (Signed)
Great to see you today.  I have refilled the medication(s) we provide.   If labs were collected, we will inform you of lab results once received either by echart message or telephone call.   - echart message- for normal results that have been seen by the patient already.   - telephone call: abnormal results or if patient has not viewed results in their echart.  

## 2021-07-11 ENCOUNTER — Telehealth: Payer: Self-pay

## 2021-07-11 ENCOUNTER — Ambulatory Visit (INDEPENDENT_AMBULATORY_CARE_PROVIDER_SITE_OTHER): Payer: Self-pay

## 2021-07-11 ENCOUNTER — Other Ambulatory Visit: Payer: Self-pay

## 2021-07-11 DIAGNOSIS — R0981 Nasal congestion: Secondary | ICD-10-CM

## 2021-07-11 DIAGNOSIS — J988 Other specified respiratory disorders: Secondary | ICD-10-CM

## 2021-07-11 DIAGNOSIS — B9689 Other specified bacterial agents as the cause of diseases classified elsewhere: Secondary | ICD-10-CM

## 2021-07-11 DIAGNOSIS — R051 Acute cough: Secondary | ICD-10-CM

## 2021-07-11 DIAGNOSIS — J209 Acute bronchitis, unspecified: Secondary | ICD-10-CM

## 2021-07-11 DIAGNOSIS — J329 Chronic sinusitis, unspecified: Secondary | ICD-10-CM

## 2021-07-11 MED ORDER — METHYLPREDNISOLONE ACETATE 80 MG/ML IJ SUSP
80.0000 mg | Freq: Once | INTRAMUSCULAR | Status: AC
  Administered 2021-07-11: 80 mg via INTRAMUSCULAR

## 2021-07-11 NOTE — Telephone Encounter (Signed)
Called [facility] [address] and all [facility] within 20 mile radius are out of this medication.  Please review and advise

## 2021-07-11 NOTE — Progress Notes (Signed)
Brenda Ray is a 50 y.o. female presents to the office today for depo-medrol injection, per physician's orders. Original order: 07/10/21 Depo-medrol (med), 80mg  (dose),  IM (route) was administered left ventrogluteal (location) today. Patient tolerated injection. Patient due for follow up labs/provider appt: No. Date due: n/a, appt made No Patient next injection due: n/a, appt made No  

## 2021-07-11 NOTE — Telephone Encounter (Signed)
[facility] not have medication in stock. Patient requesting to change to another medication  guaiFENesin-codeine 100-10 MG/5ML syrup [[id number]]

## 2021-07-12 MED ORDER — GUAIFENESIN-CODEINE 100-10 MG/5ML PO SOLN: 10.0000 mL | Freq: Four times a day (QID) | ORAL | 0 refills | Status: DC | PRN

## 2021-07-12 NOTE — Telephone Encounter (Signed)
Pt aware.

## 2021-07-12 NOTE — Addendum Note (Signed)
Addended by: Emi Holes D on: 07/12/2021 10:40 AM   Modules accepted: Orders

## 2021-07-12 NOTE — Telephone Encounter (Signed)
Please inform patient new prescription called into [facility]

## 2021-07-12 NOTE — Addendum Note (Signed)
Addended by: Felix Pacini A on: 07/12/2021 04:36 PM   Modules accepted: Orders

## 2021-07-12 NOTE — Telephone Encounter (Signed)
Pt called office today to follow up on medication. She is calling [facility] in [place] to see if they have medication in stock and will call us back to let us know.

## 2021-07-12 NOTE — Telephone Encounter (Signed)
In stock at [facility]

## 2021-08-28 ENCOUNTER — Other Ambulatory Visit: Payer: Self-pay

## 2021-08-29 ENCOUNTER — Encounter: Payer: Self-pay | Admitting: Family Medicine

## 2021-08-29 ENCOUNTER — Ambulatory Visit (INDEPENDENT_AMBULATORY_CARE_PROVIDER_SITE_OTHER): Payer: 59 | Admitting: Family Medicine

## 2021-08-29 VITALS — BP 119/76 | HR 72 | Temp 98.0°F | Wt 192.6 lb

## 2021-08-29 DIAGNOSIS — E669 Obesity, unspecified: Secondary | ICD-10-CM

## 2021-08-29 DIAGNOSIS — E559 Vitamin D deficiency, unspecified: Secondary | ICD-10-CM

## 2021-08-29 DIAGNOSIS — K219 Gastro-esophageal reflux disease without esophagitis: Secondary | ICD-10-CM | POA: Diagnosis not present

## 2021-08-29 DIAGNOSIS — M25512 Pain in left shoulder: Secondary | ICD-10-CM | POA: Diagnosis not present

## 2021-08-29 DIAGNOSIS — R7309 Other abnormal glucose: Secondary | ICD-10-CM

## 2021-08-29 DIAGNOSIS — N951 Menopausal and female climacteric states: Secondary | ICD-10-CM | POA: Diagnosis not present

## 2021-08-29 DIAGNOSIS — E538 Deficiency of other specified B group vitamins: Secondary | ICD-10-CM

## 2021-08-29 DIAGNOSIS — J301 Allergic rhinitis due to pollen: Secondary | ICD-10-CM

## 2021-08-29 DIAGNOSIS — F419 Anxiety disorder, unspecified: Secondary | ICD-10-CM | POA: Diagnosis not present

## 2021-08-29 DIAGNOSIS — M25519 Pain in unspecified shoulder: Secondary | ICD-10-CM

## 2021-08-29 DIAGNOSIS — Z79899 Other long term (current) drug therapy: Secondary | ICD-10-CM

## 2021-08-29 DIAGNOSIS — G8929 Other chronic pain: Secondary | ICD-10-CM

## 2021-08-29 DIAGNOSIS — J309 Allergic rhinitis, unspecified: Secondary | ICD-10-CM | POA: Insufficient documentation

## 2021-08-29 MED ORDER — HYDROXYZINE HCL 10 MG PO TABS: ORAL_TABLET | ORAL | 1 refills | Status: DC

## 2021-08-29 MED ORDER — VENLAFAXINE HCL ER 37.5 MG PO CP24: 37.5000 mg | ORAL_CAPSULE | Freq: Every day | ORAL | 0 refills | Status: DC

## 2021-08-29 MED ORDER — MELOXICAM 15 MG PO TABS: 15.0000 mg | ORAL_TABLET | Freq: Every day | ORAL | 1 refills | Status: DC

## 2021-08-29 MED ORDER — VENLAFAXINE HCL ER 75 MG PO CP24: 75.0000 mg | ORAL_CAPSULE | Freq: Every day | ORAL | 1 refills | Status: DC

## 2021-08-29 MED ORDER — PANTOPRAZOLE SODIUM 40 MG PO TBEC: 40.0000 mg | DELAYED_RELEASE_TABLET | Freq: Every day | ORAL | 1 refills | Status: DC

## 2021-08-29 MED ORDER — CETIRIZINE HCL 10 MG PO TABS: 10.0000 mg | ORAL_TABLET | Freq: Every day | ORAL | 3 refills | Status: DC

## 2021-08-29 MED ORDER — FLUTICASONE PROPIONATE 50 MCG/ACT NA SUSP: 2.0000 | Freq: Every day | NASAL | 6 refills | Status: DC

## 2021-08-29 NOTE — Progress Notes (Signed)
SUBJECTIVE Chief Complaint  Patient presents with   Medication Refill    Pt not fasting    HPI: Brenda Ray is a 51 y.o. female present for [facility] follow up.  Anxiety/hot flashes/fatigue:  Patient reports compliance with Celexa 40 mg only.  She reports having more anxiety secondary to the grief of her father, and her mother moving in with her.   She thinks the vitamin supplements have helped with the fatigue.  She also is using the Atarax on occasions and feels it is working well for her. Tried: paxil (sleepy), ativan (sleepy at low doses)  Shoulder pain: She is taking mobic Qd.  Patient reports Mobic is working well for her shoulder pain  GERD:compliance with protonix 40 mg qd.    Allergies: Patient continues to take her Zyrtec nightly and Flonase nasal spray daily, this combination seems to work well for her. ROS: See pertinent positives and negatives per HPI. Depression screen [facility] 2/9 07/10/2021 11/02/2020 09/21/2020 06/29/2019 06/08/2018  Decreased Interest 0 0 1 0 0  Down, Depressed, Hopeless 0 1 1 0 0  PHQ - 2 Score 0 1 2 0 0  Altered sleeping - 0 2 - -  Tired, decreased energy - 0 3 - -  Change in appetite - 0 3 - -  Feeling bad or failure about yourself  - 0 0 - -  Trouble concentrating - 0 0 - -  Moving slowly or fidgety/restless - 0 1 - -  Suicidal thoughts - 0 0 - -  PHQ-9 Score - 1 11 - -   GAD 7 : Generalized Anxiety Score 11/02/2020 09/21/2020 01/17/2020 06/29/2019  Nervous, Anxious, on Edge 1 2 0 0  Control/stop worrying 0 2 0 0  Worry too much - different things 1 2 0 0  Trouble relaxing 0 1 0 0  Restless 0 1 0 0  Easily annoyed or irritable 0 1 0 0  Afraid - awful might happen 0 0 0 0  Total GAD 7 Score 2 9 0 0  Anxiety Difficulty - - Not difficult at all Not difficult at all     Patient Active Problem List   Diagnosis Date Noted   Allergic rhinitis 08/29/2021   GERD without esophagitis 08/22/2020   Obesity (BMI 30-39.9) 07/05/2019   Chronic  shoulder pain 08/02/2018   Benzodiazepine contract exists 10/05/2017   Anxiety 10/02/2017   Hot flashes due to menopause 10/02/2017   Stress incontinence 10/01/2015    Social History   Tobacco Use   Smoking status: Former    Packs/day: 0.50    Years: 36.00    Pack years: 18.00    Types: Cigarettes, E-cigarettes    Quit date: 02/25/2017    Years since quitting: 4.5   Smokeless tobacco: Former  Substance Use Topics   Alcohol use: Not Currently    Current Outpatient Medications:    Cholecalciferol (VITAMIN D-3) 25 MCG (1000 UT) CAPS, Take by mouth., Disp: , Rfl:    venlafaxine XR (EFFEXOR XR) 37.5 MG 24 hr capsule, Take 1 capsule (37.5 mg total) by mouth daily with breakfast., Disp: 7 capsule, Rfl: 0   venlafaxine XR (EFFEXOR XR) 75 MG 24 hr capsule, Take 1 capsule (75 mg total) by mouth daily with breakfast., Disp: 90 capsule, Rfl: 1   cetirizine (ZYRTEC) 10 MG tablet, Take 1 tablet (10 mg total) by mouth at bedtime., Disp: 90 tablet, Rfl: 3   fluticasone (FLONASE) 50 MCG/ACT nasal spray, Place  2 sprays into both nostrils daily., Disp: 16 g, Rfl: 6   hydrOXYzine (ATARAX) 10 MG tablet, 1/2 tab during the day and 2 tabs before bed. Can take every 8 hours as needed., Disp: 90 tablet, Rfl: 1   meloxicam (MOBIC) 15 MG tablet, Take 1 tablet (15 mg total) by mouth daily., Disp: 90 tablet, Rfl: 1   pantoprazole (PROTONIX) 40 MG tablet, Take 1 tablet (40 mg total) by mouth daily., Disp: 90 tablet, Rfl: 1  Allergies  Allergen Reactions   Augmentin [Amoxicillin-Pot Clavulanate] Diarrhea   Doxycycline Nausea And Vomiting   Hydrocodone Itching    OBJECTIVE: BP 119/76    Pulse 72    Temp 98 F (36.7 C)    Wt 192 lb 9.6 oz (87.4 kg)    SpO2 95%    BMI 32.05 kg/m  Physical Exam Vitals and nursing note reviewed.  Constitutional:      General: She is not in acute distress.    Appearance: Normal appearance. She is not ill-appearing, toxic-appearing or diaphoretic.  HENT:     Head:  Normocephalic and atraumatic.     Mouth/Throat:     Mouth: Mucous membranes are moist.  Eyes:     General: No scleral icterus.       Right eye: No discharge.        Left eye: No discharge.     Extraocular Movements: Extraocular movements intact.     Conjunctiva/sclera: Conjunctivae normal.     Pupils: Pupils are equal, round, and reactive to light.  Cardiovascular:     Rate and Rhythm: Normal rate and regular rhythm.  Pulmonary:     Effort: Pulmonary effort is normal. No respiratory distress.     Breath sounds: Normal breath sounds. No wheezing, rhonchi or rales.  Musculoskeletal:     Cervical back: Neck supple. No tenderness.  Lymphadenopathy:     Cervical: No cervical adenopathy.  Skin:    General: Skin is warm and dry.     Coloration: Skin is not jaundiced or pale.     Findings: No erythema or rash.  Neurological:     Mental Status: She is alert and oriented to person, place, and time. Mental status is at baseline.     Motor: No weakness.     Gait: Gait normal.  Psychiatric:        Mood and Affect: Mood normal.        Behavior: Behavior normal.        Thought Content: Thought content normal.        Judgment: Judgment normal.     ASSESSMENT AND PLAN: Brenda Ray is a 51 y.o. female present for  Anxiety/Hot flashes due to menopauseBenzodiazepine contract exists/panic Increase in anxiety -tapering off celexa 40 mg QD > as tapering on effexor 75 mg qd Dc paxil Start buspar taper to 10 mg BID.  - continue ativan 0.5 mg BID PRN. Uses very infrequently.  Does not need refills today. - [facility] database reviewed 09/03/21 - TSH; Future -f/u 4 weeks  Chronic pain of left shoulder Stable Continue  Mobic 15 mg daily.  GERD without esophagitis Stable. Continue Protonix   Non-seasonal allergic rhinitis due to pollen Continue Flonase Continue zyrtec  B12 deficiency/Vitamin D deficiency - B12; Future - Vitamin D (25 hydroxy); Future  Elevated glucose/Obesity (BMI  30-39.9) - Hemoglobin A1c; Future - Comp Met (CMET); Future - Cardio IQ Insulin Resistance Panel with Score; Future   Orders Placed This Encounter  Procedures  Vitamin D (25 hydroxy)   B12   Hemoglobin A1c   Comp Met (CMET)   TSH   Lipid panel   Cardio IQ Insulin Resistance Panel with Score   Meds ordered this encounter  Medications   cetirizine (ZYRTEC) 10 MG tablet    Sig: Take 1 tablet (10 mg total) by mouth at bedtime.    Dispense:  90 tablet    Refill:  3   fluticasone (FLONASE) 50 MCG/ACT nasal spray    Sig: Place 2 sprays into both nostrils daily.    Dispense:  16 g    Refill:  6   hydrOXYzine (ATARAX) 10 MG tablet    Sig: 1/2 tab during the day and 2 tabs before bed. Can take every 8 hours as needed.    Dispense:  90 tablet    Refill:  1   meloxicam (MOBIC) 15 MG tablet    Sig: Take 1 tablet (15 mg total) by mouth daily.    Dispense:  90 tablet    Refill:  1   pantoprazole (PROTONIX) 40 MG tablet    Sig: Take 1 tablet (40 mg total) by mouth daily.    Dispense:  90 tablet    Refill:  1   venlafaxine XR (EFFEXOR XR) 37.5 MG 24 hr capsule    Sig: Take 1 capsule (37.5 mg total) by mouth daily with breakfast.    Dispense:  7 capsule    Refill:  0   venlafaxine XR (EFFEXOR XR) 75 MG 24 hr capsule    Sig: Take 1 capsule (75 mg total) by mouth daily with breakfast.    Dispense:  90 capsule    Refill:  1    Script #2 of taper   Referral Orders  No referral(s) requested today      Howard Pouch, DO 09/03/2021

## 2021-08-29 NOTE — Patient Instructions (Addendum)
° ° °  When you start the effexor 37.5 mg - you will decrease celexa to 1/2 tab for 7 days. Then stop celexa and increase to the Effexor 75 mg a day.  Great to see you today.  I have refilled the medication(s) we provide.   If labs were collected, we will inform you of lab results once received either by echart message or telephone call.   - echart message- for normal results that have been seen by the patient already.   - telephone call: abnormal results or if patient has not viewed results in their echart.

## 2021-09-30 ENCOUNTER — Ambulatory Visit (INDEPENDENT_AMBULATORY_CARE_PROVIDER_SITE_OTHER): Payer: 59 | Admitting: Family Medicine

## 2021-09-30 ENCOUNTER — Other Ambulatory Visit: Payer: Self-pay

## 2021-09-30 ENCOUNTER — Encounter: Payer: Self-pay | Admitting: Family Medicine

## 2021-09-30 VITALS — BP 127/78 | HR 86 | Temp 98.0°F | Ht 65.0 in | Wt 191.0 lb

## 2021-09-30 DIAGNOSIS — R7309 Other abnormal glucose: Secondary | ICD-10-CM

## 2021-09-30 DIAGNOSIS — E669 Obesity, unspecified: Secondary | ICD-10-CM

## 2021-09-30 DIAGNOSIS — N951 Menopausal and female climacteric states: Secondary | ICD-10-CM

## 2021-09-30 DIAGNOSIS — E559 Vitamin D deficiency, unspecified: Secondary | ICD-10-CM

## 2021-09-30 DIAGNOSIS — F419 Anxiety disorder, unspecified: Secondary | ICD-10-CM

## 2021-09-30 DIAGNOSIS — N393 Stress incontinence (female) (male): Secondary | ICD-10-CM | POA: Diagnosis not present

## 2021-09-30 DIAGNOSIS — E538 Deficiency of other specified B group vitamins: Secondary | ICD-10-CM

## 2021-09-30 DIAGNOSIS — K219 Gastro-esophageal reflux disease without esophagitis: Secondary | ICD-10-CM

## 2021-09-30 LAB — TSH: TSH: 2.83 u[IU]/mL (ref 0.35–5.50)

## 2021-09-30 LAB — COMPREHENSIVE METABOLIC PANEL
ALT: 24 U/L (ref 0–35)
AST: 15 U/L (ref 0–37)
Albumin: 4.6 g/dL (ref 3.5–5.2)
Alkaline Phosphatase: 66 U/L (ref 39–117)
BUN: 10 mg/dL (ref 6–23)
CO2: 29 mEq/L (ref 19–32)
Calcium: 9.9 mg/dL (ref 8.4–10.5)
Chloride: 101 mEq/L (ref 96–112)
Creatinine, Ser: 1.01 mg/dL (ref 0.40–1.20)
GFR: 63.76 mL/min (ref >60.00)
Glucose, Bld: 85 mg/dL (ref 70–99)
Potassium: 4.1 mEq/L (ref 3.5–5.1)
Sodium: 140 mEq/L (ref 135–145)
Total Bilirubin: 0.8 mg/dL (ref 0.2–1.2)
Total Protein: 7.6 g/dL (ref 6.0–8.3)

## 2021-09-30 LAB — LIPID PANEL
Cholesterol: 196 mg/dL (ref 0–200)
HDL: 44.2 mg/dL (ref >39.00)
LDL Cholesterol: 119 mg/dL — ABNORMAL HIGH (ref 0–99)
NonHDL: 151.57
Total CHOL/HDL Ratio: 4
Triglycerides: 165 mg/dL — ABNORMAL HIGH (ref 0.0–149.0)
VLDL: 33 mg/dL (ref 0.0–40.0)

## 2021-09-30 LAB — HEMOGLOBIN A1C: Hgb A1c MFr Bld: 5.1 % (ref 4.6–6.5)

## 2021-09-30 LAB — VITAMIN D 25 HYDROXY (VIT D DEFICIENCY, FRACTURES): VITD: 24.59 ng/mL — ABNORMAL LOW (ref 30.00–100.00)

## 2021-09-30 LAB — VITAMIN B12: Vitamin B-12: 418 pg/mL (ref 211–911)

## 2021-09-30 MED ORDER — BUSPIRONE HCL 10 MG PO TABS: 10.0000 mg | ORAL_TABLET | Freq: Two times a day (BID) | ORAL | 5 refills | Status: DC

## 2021-09-30 NOTE — Patient Instructions (Signed)
Start buspar every 12 hours today! Start at 1/2 tab twice a day, then after 1 increase to 1 tab every 12 hours.  ? ? ? ?

## 2021-09-30 NOTE — Progress Notes (Signed)
? ? ? ? ?SUBJECTIVE ?Chief Complaint  ?Patient presents with  ? Anxiety  ?  F/u;   ? ? ?HPI: Brenda Ray is a 51 y.o. female present for [facility] follow up.  ?Anxiety/hot flashes/fatigue:  ?Patient reports tapering off celexa and onto effexor went well. She forgot to start the buspar.  She felt a little anxious today. She reported having more anxiety secondary to the grief of her father, and her mother moving in with her.   ?She thinks the vitamin supplements have helped with the fatigue.  She also is using the Atarax on occasions and feels it is working well for her. ?Tried: paxil (sleepy), ativan (sleepy at low doses) ? ?Weight loss counseling.  ?Normal diet consist of egg in the morning and [place] pizza, tacos or like for lunch and dinner. She does not exercise. ?Start weight today is 191 lbs.  ? ?Documentation only: ?Shoulder pain: ?She is taking mobic Qd.  Patient reports Mobic is working well for her shoulder pain ? ?GERD:compliance with protonix 40 mg qd.  ? ? ?Allergies: ?Patient continues to take her Zyrtec nightly and Flonase nasal spray daily, this combination seems to work well for her. ?ROS: See pertinent positives and negatives per HPI. ?Depression screen [facility] 2/9 09/30/2021 07/10/2021 11/02/2020 09/21/2020 06/29/2019  ?Decreased Interest 1 0 0 1 0  ?Down, Depressed, Hopeless 0 0 1 1 0  ?PHQ - 2 Score 1 0 1 2 0  ?Altered sleeping 2 - 0 2 -  ?Tired, decreased energy 3 - 0 3 -  ?Change in appetite 0 - 0 3 -  ?Feeling bad or failure about yourself  0 - 0 0 -  ?Trouble concentrating 0 - 0 0 -  ?Moving slowly or fidgety/restless 0 - 0 1 -  ?Suicidal thoughts 0 - 0 0 -  ?PHQ-9 Score 6 - 1 11 -  ? ?GAD 7 : Generalized Anxiety Score 09/30/2021 11/02/2020 09/21/2020 01/17/2020  ?Nervous, Anxious, on Edge 1 1 2  0  ?Control/stop worrying 0 0 2 0  ?Worry too much - different things 1 1 2  0  ?Trouble relaxing - 0 1 0  ?Restless 0 0 1 0  ?Easily annoyed or irritable 0 0 1 0  ?Afraid - awful might happen 0 0 0 0  ?Total GAD 7  Score - 2 9 0  ?Anxiety Difficulty - - - Not difficult at all  ? ? ? ?Patient Active Problem List  ? Diagnosis Date Noted  ? Allergic rhinitis 08/29/2021  ? GERD without esophagitis 08/22/2020  ? Obesity (BMI 30-39.9) 07/05/2019  ? Chronic shoulder pain 08/02/2018  ? Benzodiazepine contract exists 10/05/2017  ? Anxiety 10/02/2017  ? Hot flashes due to menopause 10/02/2017  ? Stress incontinence 10/01/2015  ?  ?Social History  ? ?Tobacco Use  ? Smoking status: Former  ?  Packs/day: 0.50  ?  Years: 36.00  ?  Pack years: 18.00  ?  Types: Cigarettes, E-cigarettes  ?  Quit date: 02/25/2017  ?  Years since quitting: 4.5  ? Smokeless tobacco: Former  ?Substance Use Topics  ? Alcohol use: Not Currently  ? ? ?Current Outpatient Medications:  ?  busPIRone (BUSPAR) 10 MG tablet, Take 1 tablet (10 mg total) by mouth 2 (two) times daily., Disp: 60 tablet, Rfl: 5 ?  cetirizine (ZYRTEC) 10 MG tablet, Take 1 tablet (10 mg total) by mouth at bedtime., Disp: 90 tablet, Rfl: 3 ?  Cholecalciferol (VITAMIN D-3) 25 MCG (1000 UT) CAPS, Take  by mouth., Disp: , Rfl:  ?  fluticasone (FLONASE) 50 MCG/ACT nasal spray, Place 2 sprays into both nostrils daily., Disp: 16 g, Rfl: 6 ?  hydrOXYzine (ATARAX) 10 MG tablet, 1/2 tab during the day and 2 tabs before bed. Can take every 8 hours as needed., Disp: 90 tablet, Rfl: 1 ?  meloxicam (MOBIC) 15 MG tablet, Take 1 tablet (15 mg total) by mouth daily., Disp: 90 tablet, Rfl: 1 ?  pantoprazole (PROTONIX) 40 MG tablet, Take 1 tablet (40 mg total) by mouth daily., Disp: 90 tablet, Rfl: 1 ?  venlafaxine XR (EFFEXOR XR) 75 MG 24 hr capsule, Take 1 capsule (75 mg total) by mouth daily with breakfast., Disp: 90 capsule, Rfl: 1 ? ?Allergies  ?Allergen Reactions  ? Augmentin [Amoxicillin-Pot Clavulanate] Diarrhea  ? Doxycycline Nausea And Vomiting  ? Hydrocodone Itching  ? ? ?OBJECTIVE: ?BP 127/78   Pulse 86   Temp 98 ?F (36.7 ?C) (Oral)   Ht 5' 5"  (1.651 m)   Wt 191 lb (86.6 kg)   SpO2 98%   BMI 31.78  kg/m?  ?Physical Exam ?Vitals and nursing note reviewed.  ?Constitutional:   ?   General: She is not in acute distress. ?   Appearance: Normal appearance. She is not ill-appearing, toxic-appearing or diaphoretic.  ?HENT:  ?   Head: Normocephalic and atraumatic.  ?   Mouth/Throat:  ?   Mouth: Mucous membranes are moist.  ?Eyes:  ?   General: No scleral icterus.    ?   Right eye: No discharge.     ?   Left eye: No discharge.  ?   Extraocular Movements: Extraocular movements intact.  ?   Conjunctiva/sclera: Conjunctivae normal.  ?   Pupils: Pupils are equal, round, and reactive to light.  ?Cardiovascular:  ?   Rate and Rhythm: Normal rate and regular rhythm.  ?Pulmonary:  ?   Effort: Pulmonary effort is normal. No respiratory distress.  ?   Breath sounds: Normal breath sounds. No wheezing, rhonchi or rales.  ?Musculoskeletal:  ?   Cervical back: Neck supple. No tenderness.  ?Lymphadenopathy:  ?   Cervical: No cervical adenopathy.  ?Skin: ?   General: Skin is warm and dry.  ?   Coloration: Skin is not jaundiced or pale.  ?   Findings: No erythema or rash.  ?Neurological:  ?   Mental Status: She is alert and oriented to person, place, and time. Mental status is at baseline.  ?   Motor: No weakness.  ?   Gait: Gait normal.  ?Psychiatric:     ?   Mood and Affect: Mood normal.     ?   Behavior: Behavior normal.     ?   Thought Content: Thought content normal.     ?   Judgment: Judgment normal.  ? ? ? ?ASSESSMENT AND PLAN: ?Brenda Ray is a 51 y.o. female present for  ?Anxiety/Hot flashes due to menopauseBenzodiazepine contract exists/panic ?Tapered off celexa. ?Tolerating effexor 75 mg. > may need to increase to 150 mg, but will wait until next appt to see if needed after buspar.  ?Forgot to start buspar taper to 10 mg BID> encouraged her to start tapering.  ?- continue ativan 0.5 mg BID PRN. Uses very infrequently.  Does not need refills today. ?- [facility]CCS database reviewed 09/30/21 ?- TSH-wnl 08/2020 ?-f/u 4  weeks ? ?Chronic pain of left shoulder ?Stable ?Continue  Mobic 15 mg daily. ? ?GERD without esophagitis ?Stable. ?Continue Protonix  ? ?  Non-seasonal allergic rhinitis due to pollen ?Continue Flonase ?Continue zyrtec ? ?B12 deficiency/Vitamin D deficiency ?Had not been collected will collect today ?- B12; Future ?- Vitamin D (25 hydroxy); Future ? ?Elevated glucose/Obesity (BMI 30-39.9) ?Had not been collected> will collect today ?Patient was counseled on exercise, calorie counting, weight loss and potential medications to help with weight loss today. ?-Patient was provided with online resources for: Weekly net calorie calculator.  Applications for calorie counting.  Patient was advised to ensure she is taking in adequate nutrition daily by meeting calorie goals. ?-Patient was educated on dietary changes to not only lose weight but to eat healthy.  Patient was educated on glycemic index. ?-Patient was educated on exercise goal of 150 minutes a week (plus warm up and cool down) of cardiovascular exercise.  Patient was educated on heart rate for cardiovascular and fat burning zones. ?-Patient was encouraged to maintain adequate water consumption of at least 120 ounces a day, more if exercising/sweating. ? ?- Hemoglobin A1c; Future ?- Comp Met (CMET); Future ?- Cardio IQ Insulin Resistance Panel with Score; Future ? ? ?No orders of the defined types were placed in this encounter. ? ?Meds ordered this encounter  ?Medications  ? busPIRone (BUSPAR) 10 MG tablet  ?  Sig: Take 1 tablet (10 mg total) by mouth 2 (two) times daily.  ?  Dispense:  60 tablet  ?  Refill:  5  ? ?Referral Orders  ?No referral(s) requested today  ? ? ? ? ?Howard Pouch, DO ?09/30/2021 ? ? ?

## 2021-10-07 LAB — CARDIO IQ INSULIN RESISTANCE PANEL WITH SCORE
C-PEPTIDE, LC/MS/MS: 3.61 ng/mL — ABNORMAL HIGH (ref 0.68–2.16)
INSULIN, INTACT, LC/MS/MS: 11 u[IU]/mL (ref <=16)
Insulin Resistance Score: 87 — ABNORMAL HIGH (ref <=66)

## 2021-10-08 ENCOUNTER — Telehealth: Payer: Self-pay | Admitting: Family Medicine

## 2021-10-08 ENCOUNTER — Other Ambulatory Visit: Payer: Self-pay | Admitting: Family Medicine

## 2021-10-08 DIAGNOSIS — E8881 Metabolic syndrome: Secondary | ICD-10-CM | POA: Insufficient documentation

## 2021-10-08 DIAGNOSIS — E88819 Insulin resistance, unspecified: Secondary | ICD-10-CM | POA: Insufficient documentation

## 2021-10-08 DIAGNOSIS — E6609 Other obesity due to excess calories: Secondary | ICD-10-CM

## 2021-10-08 DIAGNOSIS — E669 Obesity, unspecified: Secondary | ICD-10-CM | POA: Insufficient documentation

## 2021-10-08 MED ORDER — TIRZEPATIDE 2.5 MG/0.5ML ~~LOC~~ SOAJ: 2.5000 mg | SUBCUTANEOUS | 5 refills | Status: DC

## 2021-10-08 NOTE — Telephone Encounter (Signed)
Spoke with pt regarding labs and instructions.   

## 2021-10-08 NOTE — Telephone Encounter (Signed)
LVM for pt to CB regarding results.  

## 2021-10-08 NOTE — Telephone Encounter (Signed)
Please inform patient  ?Her insulin resistance score shows significant insulin resistance. ?I have called in [facility] weekly injection.  This is in the same class as Ozempic and Wegovy. ? ? ?Please inform her is likely we may need to fill out a prior authorization for this medication.  Her pharmacy will need to let us know if so.  It is also possible it may be denied by her insurance.  In which we could attempt to prescribe [facility] for her, which is also weekly injection. ? ?If she does become approved for one of the injections, then I would encourage her to make a nurse appointment when she picks up the medication for instruction on proper technique of injection. ?And if she is approved, I would want to follow-up with her in 5 weeks after starting medication. ?

## 2021-10-10 NOTE — Telephone Encounter (Signed)
Spoke with pt and informed to contact pharmacy to fill script and see if a PA is needed.  ?

## 2021-10-10 NOTE — Telephone Encounter (Signed)
Patient calling to check status of Mounjaro refill.  Does she need to make nurse appt to learn how to use? ? ?Please call [phone] ?

## 2021-10-11 ENCOUNTER — Other Ambulatory Visit: Payer: Self-pay | Admitting: Family Medicine

## 2021-10-11 MED ORDER — OZEMPIC (0.25 OR 0.5 MG/DOSE) 2 MG/1.5ML ~~LOC~~ SOPN
0.2500 mg | PEN_INJECTOR | SUBCUTANEOUS | 1 refills | Status: DC
Start: 2021-10-11 — End: 2021-10-23

## 2021-10-11 NOTE — Telephone Encounter (Signed)
Pt called insurance and stated they will cover Trulicity and Ozempic.  ?

## 2021-10-11 NOTE — Telephone Encounter (Signed)
Faxed received asking for alt med on the mounjaro. ? ?LVM for pt to CB regarding medication.  ? ?Note: ?Please have pt call ins and see what is on pts formulary list.  ?

## 2021-10-11 NOTE — Telephone Encounter (Signed)
Patient calling for Toria.  Toria with patient at the time of call. ? ?Please call patient back whenever you are available. ? ? ?

## 2021-10-11 NOTE — Telephone Encounter (Signed)
Awaiting form from pharmacy for possible PA.  ?

## 2021-10-11 NOTE — Telephone Encounter (Signed)
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 ?I discontinued the [facility] and called in a Ozempic.  I have doubts that it will be covered.  Considering her insurance stated Trulicity or Ozempic.  Trulicity is a diabetic medicine-there are not treating diabetes. ?

## 2021-10-11 NOTE — Telephone Encounter (Signed)
Pt called back about medication. ? ?Asked pt did she call her insurance to see what on formulary list. ? ?Pt said no, she called her pharmacy, and asked about details of medication. ? ?She will call insurance about formulary list, then call back. ?

## 2021-10-11 NOTE — Addendum Note (Signed)
Addended by: Howard Pouch A on: 10/11/2021 04:43 PM ? ? Modules accepted: Orders ? ?

## 2021-10-14 ENCOUNTER — Telehealth: Payer: Self-pay | Admitting: Family Medicine

## 2021-10-14 NOTE — Telephone Encounter (Signed)
Pt called, please call pt back ?Insurance won't cover [employer]. They won't accept it. ? ?Pt cell: [phone] ?

## 2021-10-14 NOTE — Telephone Encounter (Signed)
Brenda Ray (Key: [id number]) ?Ozempic (0.25 or 0.5 MG/DOSE) 2MG /1.5ML pen-injectors ?    ?Status: New ? ?Created: March 20th, 2023 ?

## 2021-10-15 NOTE — Telephone Encounter (Signed)
Pt informed of PA being submitted and will await a call with ins decision.  ?

## 2021-10-17 ENCOUNTER — Encounter: Payer: Self-pay | Admitting: Family Medicine

## 2021-10-17 ENCOUNTER — Telehealth: Payer: Self-pay

## 2021-10-17 ENCOUNTER — Ambulatory Visit (INDEPENDENT_AMBULATORY_CARE_PROVIDER_SITE_OTHER): Payer: 59 | Admitting: Family Medicine

## 2021-10-17 ENCOUNTER — Other Ambulatory Visit: Payer: Self-pay

## 2021-10-17 VITALS — BP 133/85 | HR 88 | Temp 98.4°F | Ht 65.0 in | Wt 193.4 lb

## 2021-10-17 DIAGNOSIS — S8391XA Sprain of unspecified site of right knee, initial encounter: Secondary | ICD-10-CM

## 2021-10-17 MED ORDER — HYDROCODONE-ACETAMINOPHEN 5-325 MG PO TABS
ORAL_TABLET | ORAL | 0 refills | Status: DC
Start: 2021-10-17 — End: 2021-10-18

## 2021-10-17 NOTE — Telephone Encounter (Signed)
Patient calling for Brenda Ray about recent info about medication.  Insurance will not cover Ozempic or Monjaro. ? ?Dr. Claiborne Billings mentioned another medication she could prescribe Emberly,  if the 2 above mentioned meds were not covered by insurance. ?She is aware Dr. Claiborne Billings is not in office today or tomorrow. ? ?Please call [phone]. ? ?

## 2021-10-17 NOTE — Telephone Encounter (Signed)
PA Case: [id number], Status: Denied, Denial Rationale: Your medication request has been denied as it does not appear to meet medically necessary requirements. Plan rules require clinical parameters (diagnosis, lab values, test results, physical exam findings etc.) be met for medical necessity approval. Information submitted does not indicate required parameter results were met. Coverage for Ozempic is denied. It does not meet medical necessity. Ozempic has been requested for the treatment of obesity. The information provided by your prescriber does not meet [employer]'s guideline. The guideline used is: GLP-1 (glucagon-like peptide-1) Agonists Step Therapy and Quantity Limit Criteria. Information provided does not show that the policy requirements have been met. The requirement(s) not met are: A required diagnosis of type 2 diabetes mellitus; AND submission of medical records confirming the diagnosis of type 2 diabetes; AND ONE of the following: - Information indicating current treatment with the requested GLP-1 within the past 90 days; OR - the prescriber stating currently being treated with the requested GLP-1 within the past 90 days AND at risk if therapy is changed; OR - a medication history that shows the use of one or more of the following: an agent containing metformin or insulin within the past 90 days; OR - an intolerance or hypersensitivity to ONE of the following agents: metformin or insulin; OR - an FDA labeled contraindication to ALL of the following agents: metformin AND insulin; OR - a diagnosis of type 2 diabetes with or at high risk for atherosclerotic cardiovascular disease, heart failure, and/or chronic kidney disease. Therefore, coverage for Ozempic is denied. Of note, medications when used for weight loss are excluded by your plan benefit. ?

## 2021-10-17 NOTE — Telephone Encounter (Signed)
Pt informed PA denied.  ?

## 2021-10-17 NOTE — Progress Notes (Signed)
OFFICE VISIT ? ?10/17/2021 ? ?CC:  ?Chief Complaint  ?Patient presents with  ? Knee Pain  ?  Right knee injury 2 days ago, Tuesday evening. Yesterday it was sore and today it hurts to apply pressure. Ibuprofen use starting 2 days ago (600mg ) tid and tried elevating.  Hurts to bend and has noticeable swelling  ? ? ?Patient is a 51 y.o. female who presents for knee injury. ? ?HPI: ?2 d/a "tweaked" R knee when bracing firmly to push something. ?No pop heard, was able to walk on it, no immediate swelling.   ?Yesterday it started to swell some and pain was worse.  Hurts back of knee more than sides.  Not so much on the top.  Has to limp quite a bit.  Has noticed mild swelling below the knee as well. ? 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 ? ?Past Medical History:  ?Diagnosis Date  ? Abnormal Pap smear of cervix   ? age 33  ? Anemia   ? Anxiety   ? use to use / take Lorazepam ., last episode of panic  2 yrs. ago  ? Arthritis   ? hands, knees, back - Osteo  ? Colon polyps   ? she reports colon polyps about 20 years ago; she was told 5 yr follow up.   ? Complication of anesthesia   ? woke up before the tube was removed   ? Cystitis   ? Facial nerve disorder   ? GERD (gastroesophageal reflux disease)   ? Hiatal hernia   ? reports seen on EGD completed in her 5s [facility]. Completed for pain and FOBT +.   ? History of chicken pox   ? Pneumonia   ? as a child- hosp.   ? Vaginal delivery   ? x2  ? ? ?Past Surgical History:  ?Procedure Laterality Date  ? ANTERIOR CERVICAL DECOMP/DISCECTOMY FUSION  2017  ? C3-C5  ? BREAST BIOPSY Left 2014  ? fatty tissue only  ? CERVICAL DISCECTOMY  2014  ? CHOLECYSTECTOMY N/A 06/16/2014  ? Procedure: LAPAROSCOPIC CHOLECYSTECTOMY ;  Surgeon: 06/18/2014, MD;  Location: [facility] OR;  Service: General;  Laterality: N/A;  laparoscopic cholecystectomy  ? GYNECOLOGIC CRYOSURGERY    ? age 27  ? LAPAROSCOPIC ABDOMINAL EXPLORATION    ? for  endometrimosis   ? ? ?Outpatient Medications Prior to Visit  ?Medication Sig Dispense Refill  ? busPIRone (BUSPAR) 10 MG tablet Take 1 tablet (10 mg total) by mouth 2 (two) times daily. 60 tablet 5  ? cetirizine (ZYRTEC) 10 MG tablet Take 1 tablet (10 mg total) by mouth at bedtime. 90 tablet 3  ? Cholecalciferol (VITAMIN D-3) 25 MCG (1000 UT) CAPS Take by mouth.    ? fluticasone (FLONASE) 50 MCG/ACT nasal spray Place 2 sprays into both nostrils daily. 16 g 6  ? hydrOXYzine (ATARAX) 10 MG tablet 1/2 tab during the day and 2 tabs before bed. Can take every 8 hours as needed. 90 tablet 1  ? meloxicam (MOBIC) 15 MG tablet Take 1 tablet (15 mg total) by mouth daily. 90 tablet 1  ? pantoprazole (PROTONIX) 40 MG tablet Take 1 tablet (40 mg total) by mouth daily. 90 tablet 1  ? Semaglutide,0.25 or 0.5MG /DOS, (OZEMPIC, 0.25 OR 0.5 MG/DOSE,) 2 MG/1.5ML SOPN Inject 0.25 mg into the skin once a week. 1.5 mL 1  ? venlafaxine XR (EFFEXOR XR) 75 MG 24 hr capsule Take 1 capsule (75 mg total)  by mouth daily with breakfast. 90 capsule 1  ? ?No facility-administered medications prior to visit.  ? ? ?Allergies  ?Allergen Reactions  ? Augmentin [Amoxicillin-Pot Clavulanate] Diarrhea  ? Doxycycline Nausea And Vomiting  ? Hydrocodone Itching  ? ? ?ROS ?As per HPI ? ?PE: ? ?  10/17/2021  ? 10:56 AM 09/30/2021  ?  8:15 AM 08/29/2021  ?  8:36 AM  ?Vitals with BMI  ?Height 5\' 5"  5\' 5"    ?Weight 193 lbs 6 oz 191 lbs 192 lbs 10 oz  ?BMI 32.18 31.78   ?Systolic 133 127  ?Diastolic 85 78 76  ?Pulse 88 86 72  ? ?Physical Exam ? ?Gen: Alert, well appearing.  Patient is oriented to person, place, time, and situation. ?Right knee: Minimal effusion noted on visual inspection.  No erythema or warmth. ?Active range of motion max flexion to about 120 degrees due to pain, passive flexion to 70 degrees.  She is able to fully extend although painful.  Tender to palpation along medial lateral aspects of the knee, also over popliteal fossa just medial to  midline.  No instability noted, although her pain/tenderness prevented vigorous testing of this today ?No pitting edema.  Few small bruises in popliteal region ? ?LABS:  ?Last metabolic panel ?Lab Results  ?Component Value Date  ? GLUCOSE 85 09/30/2021  ? NA 140 09/30/2021  ? K 4.1 09/30/2021  ? CL 101 09/30/2021  ? CO2 29 09/30/2021  ? BUN 10 09/30/2021  ? CREATININE 1.01 09/30/2021  ? GFRNONAA >60 11/02/2017  ? CALCIUM 9.9 09/30/2021  ? PROT 7.6 09/30/2021  ? ALBUMIN 4.6 09/30/2021  ? BILITOT 0.8 09/30/2021  ? ALKPHOS 66 09/30/2021  ? AST 15 09/30/2021  ? ALT 24 09/30/2021  ? ANIONGAP 11 11/02/2017  ? ?Last hemoglobin A1c ?Lab Results  ?Component Value Date  ? HGBA1C 5.1 09/30/2021  ? ?IMPRESSION AND PLAN: ? ?#1 right knee sprain. ?Mild effusion noted, possible hemorrhagic component posteriorly. ?Recommended ice 20 minutes 3 times daily, hinged knee brace, and continue meloxicam 15 mg a day. ?Vicodin 5/325, 1-2 3 times daily as needed, #30. ?We will see her back in 5 to 6 days and if not significantly improved will order MRI and/or refer to sports medicine or orthopedics. ? ?Limited bedside ultrasound today: Suprapatellar pouch with physiologic level fluid.  Anechoic changes noted medial and lateral to the patella in transverse plane consistent with effusion.  Popliteal region shows a subgastroc (medial head) anechoic region with some scattered subtle hypoechoic echoes within it.  This extends distally to the level of the intersection of the semimembranosus tendon abutting medial head of gastroc.  ?No other abnormal findings. ? ? ? After Visit Summary was printed and given to the patient. ? ?FOLLOW UP: Return for 5-6d f/u knee with me. ? ?Signed:  01/02/2018, MD           10/17/2021 ? ?

## 2021-10-18 ENCOUNTER — Telehealth: Payer: Self-pay

## 2021-10-18 MED ORDER — HYDROCODONE-ACETAMINOPHEN 7.5-325 MG PO TABS: 1.0000 | ORAL_TABLET | Freq: Four times a day (QID) | ORAL | 0 refills | Status: DC | PRN

## 2021-10-18 NOTE — Telephone Encounter (Signed)
Please advise 

## 2021-10-18 NOTE — Telephone Encounter (Signed)
Pt advised of rx change and update ?

## 2021-10-18 NOTE — Telephone Encounter (Signed)
Molli Knock, Norco 7.5/325 prescription just sent ?

## 2021-10-18 NOTE — Telephone Encounter (Signed)
Pt received rx for Norco 5-325mg  yesterday during OV. Rx is on backorder at [facility]. Pharmacy is suggesting alternative. ? ?Please review and advise ? ?

## 2021-10-22 ENCOUNTER — Encounter: Payer: Self-pay | Admitting: Family Medicine

## 2021-10-22 ENCOUNTER — Ambulatory Visit (INDEPENDENT_AMBULATORY_CARE_PROVIDER_SITE_OTHER): Payer: 59 | Admitting: Family Medicine

## 2021-10-22 VITALS — BP 115/74 | HR 85 | Temp 97.7°F | Ht 65.0 in | Wt 193.6 lb

## 2021-10-22 DIAGNOSIS — S8391XD Sprain of unspecified site of right knee, subsequent encounter: Secondary | ICD-10-CM

## 2021-10-22 NOTE — Progress Notes (Signed)
OFFICE VISIT ? ?10/22/2021 ? ?CC:  ?Chief Complaint  ?Patient presents with  ? Knee Pain  ?  Swelling has decreased but still tender at times. Hears popping when she gets up in the mornings.  ? ? ?Patient is a 51 y.o. female who presents for 5d f/u R knee sprain. ?A/P as of last visit: ?"#1 right knee sprain. ?Mild effusion noted, possible hemorrhagic component posteriorly. ?Recommended ice 20 minutes 3 times daily, hinged knee brace, and continue meloxicam 15 mg a day. ?Vicodin 5/325, 1-2 3 times daily as needed, #30. ?We will see her back in 5 to 6 days and if not significantly improved will order MRI and/or refer to sports medicine or orthopedics. ? ?Limited bedside ultrasound today: Suprapatellar pouch with physiologic level fluid.  Anechoic changes noted medial and lateral to the patella in transverse plane consistent with effusion.  Popliteal region shows a subgastroc (medial head) anechoic region with some scattered subtle hypoechoic echoes within it.  This extends distally to the level of the intersection of the semimembranosus tendon abutting medial head of gastroc.  ?No other abnormal findings" ? ?INTERIM HX: ?He is feeling significantly better. ?Swelling is gone down. ?She hears a pop sometimes when walking or when standing up after sitting.  No instability.  She has had to take the hydrocodone a few times at night.  Wearing hinged brace. ? ?Past Medical History:  ?Diagnosis Date  ? Abnormal Pap smear of cervix   ? age 38  ? Anemia   ? Anxiety   ? use to use / take Lorazepam ., last episode of panic  2 yrs. ago  ? Arthritis   ? hands, knees, back - Osteo  ? Colon polyps   ? she reports colon polyps about 20 years ago; she was told 5 yr follow up.   ? Complication of anesthesia   ? woke up before the tube was removed   ? Cystitis   ? Facial nerve disorder   ? GERD (gastroesophageal reflux disease)   ? Hiatal hernia   ? reports seen on EGD completed in her 57s in [facility]. Completed for pain and FOBT +.    ? History of chicken pox   ? Pneumonia   ? as a child- hosp.   ? Vaginal delivery   ? x2  ? ? ?Past Surgical History:  ?Procedure Laterality Date  ? ANTERIOR CERVICAL DECOMP/DISCECTOMY FUSION  2017  ? C3-C5  ? BREAST BIOPSY Left 2014  ? fatty tissue only  ? CERVICAL DISCECTOMY  2014  ? CHOLECYSTECTOMY N/A 06/16/2014  ? Procedure: LAPAROSCOPIC CHOLECYSTECTOMY ;  Surgeon: Abigail Miyamoto, MD;  Location: [facility] OR;  Service: General;  Laterality: N/A;  laparoscopic cholecystectomy  ? GYNECOLOGIC CRYOSURGERY    ? age 75  ? LAPAROSCOPIC ABDOMINAL EXPLORATION    ? for endometrimosis   ? ? ?Outpatient Medications Prior to Visit  ?Medication Sig Dispense Refill  ? busPIRone (BUSPAR) 10 MG tablet Take 1 tablet (10 mg total) by mouth 2 (two) times daily. 60 tablet 5  ? cetirizine (ZYRTEC) 10 MG tablet Take 1 tablet (10 mg total) by mouth at bedtime. 90 tablet 3  ? Cholecalciferol (VITAMIN D-3) 25 MCG (1000 UT) CAPS Take by mouth.    ? fluticasone (FLONASE) 50 MCG/ACT nasal spray Place 2 sprays into both nostrils daily. 16 g 6  ? HYDROcodone-acetaminophen (NORCO) 7.5-325 MG tablet Take 1 tablet by mouth every 6 (six) hours as needed for moderate pain. 30 tablet 0  ?  hydrOXYzine (ATARAX) 10 MG tablet 1/2 tab during the day and 2 tabs before bed. Can take every 8 hours as needed. 90 tablet 1  ? meloxicam (MOBIC) 15 MG tablet Take 1 tablet (15 mg total) by mouth daily. 90 tablet 1  ? pantoprazole (PROTONIX) 40 MG tablet Take 1 tablet (40 mg total) by mouth daily. 90 tablet 1  ? venlafaxine XR (EFFEXOR XR) 75 MG 24 hr capsule Take 1 capsule (75 mg total) by mouth daily with breakfast. 90 capsule 1  ? Semaglutide,0.25 or 0.5MG /DOS, (OZEMPIC, 0.25 OR 0.5 MG/DOSE,) 2 MG/1.5ML SOPN Inject 0.25 mg into the skin once a week. (Patient not taking: Reported on 10/22/2021) 1.5 mL 1  ? ?No facility-administered medications prior to visit.  ? ? ?Allergies  ?Allergen Reactions  ? Augmentin [Amoxicillin-Pot Clavulanate] Diarrhea  ? Doxycycline  Nausea And Vomiting  ? Hydrocodone Itching  ? ? ?ROS ?As per HPI ? ?PE: ? ?  10/22/2021  ?  8:37 AM 10/17/2021  ? 10:56 AM 09/30/2021  ?  8:15 AM  ?Vitals with BMI  ?Height 5\' 5"  5\' 5"  5\' 5"   ?Weight 193 lbs 10 oz 193 lbs 6 oz 191 lbs  ?BMI 32.22 32.18 31.78  ?Systolic 115 133  ?Diastolic 74 85 78  ?Pulse 85 88 86  ? ?Physical Exam ? ?General: Alert and well-appearing. ?Right knee without swelling or erythema. ?No significant tenderness to palpation.  Range of motion fully intact actively and passively.  Cruciate and collateral ligaments stable.  No popliteal mass or swelling or tenderness. ?Patella grind negative.  Quad and patellar tendons nontender. ?Bedside ultrasound today showed no effusion and no Baker's cyst. ? ?LABS:  ?Last metabolic panel ?Lab Results  ?Component Value Date  ? GLUCOSE 85 09/30/2021  ? NA 140 09/30/2021  ? K 4.1 09/30/2021  ? CL 101 09/30/2021  ? CO2 29 09/30/2021  ? BUN 10 09/30/2021  ? CREATININE 1.01 09/30/2021  ? GFRNONAA >60 11/02/2017  ? CALCIUM 9.9 09/30/2021  ? PROT 7.6 09/30/2021  ? ALBUMIN 4.6 09/30/2021  ? BILITOT 0.8 09/30/2021  ? ALKPHOS 66 09/30/2021  ? AST 15 09/30/2021  ? ALT 24 09/30/2021  ? ANIONGAP 11 11/02/2017  ? ?IMPRESSION AND PLAN: ? ?#1 right knee sprain, improving significantly with brace, NSAID, ice, and as needed hydrocodone. ?I suspect the pop she hears is patellofemoral ligament or patellar retinaculum friction noise.  Reassured. ?Okay to use brace as needed now.  Ice as needed. ?Signs/symptoms to call or return for were reviewed and pt expressed understanding. ? ?An After Visit Summary was printed and given to the patient. ? ?FOLLOW UP: Return if symptoms worsen or fail to improve. ? ?Signed:  11/30/2021, MD           10/22/2021 ? ?

## 2021-10-23 MED ORDER — NALTREXONE HCL 50 MG PO TABS: 25.0000 mg | ORAL_TABLET | Freq: Two times a day (BID) | ORAL | 5 refills | Status: DC

## 2021-10-23 NOTE — Telephone Encounter (Signed)
I have called in Depade for her to start.  That is the medication that helps with snacking and appetite control.  To half a tab prior to her 2 largest meals of the day.  Follow-up in 4 weeks for weight loss counseling ?

## 2021-10-23 NOTE — Addendum Note (Signed)
Addended by: Felix Pacini A on: 10/23/2021 05:12 PM ? ? Modules accepted: Orders ? ?

## 2021-10-24 ENCOUNTER — Telehealth: Payer: Self-pay | Admitting: Family Medicine

## 2021-10-24 NOTE — Telephone Encounter (Signed)
Pt called she said she is retuning you're call about medication.  ? ?Pt cell: [phone] ?

## 2021-10-24 NOTE — Telephone Encounter (Signed)
Spoke with pt regarding medication and instructions.  

## 2021-10-24 NOTE — Telephone Encounter (Signed)
Ragin, Allen Norris C routed conversation to You 17 minutes ago (12:38 PM)  ? ?Ragin, Kenae C 17 minutes ago (12:38 PM)  ? ?KR ?Pt called she said she is retuning you're call about medication.  ?  ?Pt cell: [phone]  ?  ? ?

## 2021-10-24 NOTE — Telephone Encounter (Signed)
LVM for pt to CB regarding medication.  ?

## 2021-10-24 NOTE — Telephone Encounter (Signed)
Please use other encounter °

## 2021-10-24 NOTE — Telephone Encounter (Signed)
LVM for pt to CB regarding medication and to sched appt.  ?

## 2021-11-18 ENCOUNTER — Ambulatory Visit (INDEPENDENT_AMBULATORY_CARE_PROVIDER_SITE_OTHER): Payer: 59 | Admitting: Family Medicine

## 2021-11-18 ENCOUNTER — Encounter: Payer: Self-pay | Admitting: Family Medicine

## 2021-11-18 ENCOUNTER — Other Ambulatory Visit: Payer: Self-pay | Admitting: Family Medicine

## 2021-11-18 VITALS — BP 119/70 | HR 82 | Temp 97.7°F | Ht 65.0 in | Wt 194.0 lb

## 2021-11-18 DIAGNOSIS — F419 Anxiety disorder, unspecified: Secondary | ICD-10-CM | POA: Diagnosis not present

## 2021-11-18 DIAGNOSIS — E6609 Other obesity due to excess calories: Secondary | ICD-10-CM | POA: Diagnosis not present

## 2021-11-18 DIAGNOSIS — K219 Gastro-esophageal reflux disease without esophagitis: Secondary | ICD-10-CM

## 2021-11-18 DIAGNOSIS — Z6832 Body mass index (BMI) 32.0-32.9, adult: Secondary | ICD-10-CM

## 2021-11-18 DIAGNOSIS — E8881 Metabolic syndrome: Secondary | ICD-10-CM

## 2021-11-18 MED ORDER — WEGOVY 0.25 MG/0.5ML ~~LOC~~ SOAJ: 0.2500 mg | SUBCUTANEOUS | 1 refills | Status: DC

## 2021-11-18 NOTE — Patient Instructions (Signed)
? ?  We will try wegovy script.  ?Increase exercise > 150 min a week  ?No complex carbs. They are not your friend. :) ? ? ?

## 2021-11-18 NOTE — Progress Notes (Signed)
? ? ? ? ?SUBJECTIVE ?Chief Complaint  ?Patient presents with  ? Obesity  ? ? ?HPI: Brenda Ray is a 51 y.o. female present for [facility] follow up.  ?Anxiety/hot flashes/fatigue:  ?Patient reports her anxiety is now well controlled with the use of Effexor 75 mg daily and the BuSpar twice daily. ?She thinks the vitamin supplements have helped with the fatigue.  She also is using the Atarax on occasions and feels it is working well for her. ?Tried: paxil (sleepy), ativan (sleepy at low doses) ? ?Weight loss counseling.  ?Was tried on low-dose Depade to help with craving control, and she states she was unable to tolerate the medication.  It made her feel spacey, dizzy and lack of focus.  She has been trying to change her diet, limiting complex carbohydrates and making wise low glycemic carbohydrate choices.  She thinks she is taking in about 1300-1400 cal a day.  She is drinking about 100 ounces of water a day.  She has been attempting to eat more salads with lean meats.  She does admit she has been having a biscuit with egg for breakfast.  She is trying to increase her activity and exercise. ?Prior note: ?Normal diet consist of egg in the morning and [place] pizza, tacos or like for lunch and dinner. She does not exercise. ?Start weight today is 191 lbs.  ? ? ? ?  09/30/2021  ?  8:23 AM 07/10/2021  ?  3:42 PM 11/02/2020  ? 10:59 AM 09/21/2020  ?  1:47 PM 06/29/2019  ?  3:34 PM  ?Depression screen PHQ 2/9  ?Decreased Interest 1 0 0 1 0  ?Down, Depressed, Hopeless 0 0 1 1 0  ?PHQ - 2 Score 1 0 1 2 0  ?Altered sleeping 2  0 2   ?Tired, decreased energy 3  0 3   ?Change in appetite 0  0 3   ?Feeling bad or failure about yourself  0  0 0   ?Trouble concentrating 0  0 0   ?Moving slowly or fidgety/restless 0  0 1   ?Suicidal thoughts 0  0 0   ?PHQ-9 Score 6  1 11    ? ? ?  09/30/2021  ?  8:24 AM 11/02/2020  ? 11:00 AM 09/21/2020  ?  1:48 PM 01/17/2020  ?  8:12 AM  ?GAD 7 : Generalized Anxiety Score  ?Nervous, Anxious, on Edge 1 1 2  0   ?Control/stop worrying 0 0 2 0  ?Worry too much - different things 1 1 2  0  ?Trouble relaxing  0 1 0  ?Restless 0 0 1 0  ?Easily annoyed or irritable 0 0 1 0  ?Afraid - awful might happen 0 0 0 0  ?Total GAD 7 Score  2 9 0  ?Anxiety Difficulty    Not difficult at all  ? ? ? ?Patient Active Problem List  ? Diagnosis Date Noted  ? Insulin resistance 10/08/2021  ? Obesity with serious comorbidity 10/08/2021  ? Allergic rhinitis 08/29/2021  ? GERD without esophagitis 08/22/2020  ? Chronic shoulder pain 08/02/2018  ? Benzodiazepine contract exists 10/05/2017  ? Anxiety 10/02/2017  ? Hot flashes due to menopause 10/02/2017  ? Stress incontinence 10/01/2015  ?  ?Social History  ? ?Tobacco Use  ? Smoking status: Former  ?  Packs/day: 0.50  ?  Years: 36.00  ?  Pack years: 18.00  ?  Types: Cigarettes, E-cigarettes  ?  Quit date: 02/25/2017  ?  Years  since quitting: 4.7  ? Smokeless tobacco: Former  ?Substance Use Topics  ? Alcohol use: Not Currently  ? ? ?Current Outpatient Medications:  ?  busPIRone (BUSPAR) 10 MG tablet, Take 1 tablet (10 mg total) by mouth 2 (two) times daily., Disp: 60 tablet, Rfl: 5 ?  cetirizine (ZYRTEC) 10 MG tablet, Take 1 tablet (10 mg total) by mouth at bedtime., Disp: 90 tablet, Rfl: 3 ?  Cholecalciferol (VITAMIN D-3) 25 MCG (1000 UT) CAPS, Take by mouth., Disp: , Rfl:  ?  Semaglutide-Weight Management (WEGOVY) 0.25 MG/0.5ML SOAJ, Inject 0.25 mg into the skin once a week., Disp: 2 mL, Rfl: 1 ?  fluticasone (FLONASE) 50 MCG/ACT nasal spray, Place 2 sprays into both nostrils daily., Disp: 16 g, Rfl: 6 ?  hydrOXYzine (ATARAX) 10 MG tablet, 1/2 tab during the day and 2 tabs before bed. Can take every 8 hours as needed., Disp: 90 tablet, Rfl: 1 ?  meloxicam (MOBIC) 15 MG tablet, Take 1 tablet (15 mg total) by mouth daily., Disp: 90 tablet, Rfl: 1 ?  pantoprazole (PROTONIX) 40 MG tablet, Take 1 tablet (40 mg total) by mouth daily., Disp: 90 tablet, Rfl: 1 ?  venlafaxine XR (EFFEXOR XR) 75 MG 24 hr  capsule, Take 1 capsule (75 mg total) by mouth daily with breakfast., Disp: 90 capsule, Rfl: 1 ? ?Allergies  ?Allergen Reactions  ? Augmentin [Amoxicillin-Pot Clavulanate] Diarrhea  ? Doxycycline Nausea And Vomiting  ? Hydrocodone Itching  ? ? ?OBJECTIVE: ?BP 119/70   Pulse 82   Temp 97.7 ?F (36.5 ?C) (Oral)   Ht 5\' 5"  (1.651 m)   Wt 194 lb (88 kg)   SpO2 97%   BMI 32.28 kg/m?  ?Physical Exam ?Vitals and nursing note reviewed.  ?Constitutional:   ?   General: She is not in acute distress. ?   Appearance: Normal appearance. She is obese. She is not ill-appearing, toxic-appearing or diaphoretic.  ?HENT:  ?   Head: Normocephalic and atraumatic.  ?Eyes:  ?   General: No scleral icterus.    ?   Right eye: No discharge.     ?   Left eye: No discharge.  ?   Extraocular Movements: Extraocular movements intact.  ?   Conjunctiva/sclera: Conjunctivae normal.  ?   Pupils: Pupils are equal, round, and reactive to light.  ?Skin: ?   General: Skin is warm and dry.  ?   Coloration: Skin is not jaundiced or pale.  ?   Findings: No erythema or rash.  ?Neurological:  ?   Mental Status: She is alert and oriented to person, place, and time. Mental status is at baseline.  ?   Motor: No weakness.  ?   Gait: Gait normal.  ?Psychiatric:     ?   Mood and Affect: Mood normal.     ?   Behavior: Behavior normal.     ?   Thought Content: Thought content normal.     ?   Judgment: Judgment normal.  ? ? ? ?ASSESSMENT AND PLAN: ?Brenda Ray is a 51 y.o. female present for  ?Anxiety/Hot flashes due to menopauseBenzodiazepine contract exists/panic ?Tried: celexa. ?Continue effexor 75 mg.  ?Continue BuSpar twice daily  ?- continue ativan 0.5 mg BID PRN. Uses very infrequently.  Does not need refills today. ?- [facility] database reviewed 11/21/21 ?- TSH-wnl 08/2020 ?Follow-up 5.5 months ? ?Chronic pain of left shoulder ?Stable ?Continue  Mobic 15 mg daily. ? ?GERD without esophagitis ?Stable. ?Continue Protonix  ? ?Non-seasonal  allergic rhinitis due  to pollen ?Continue Flonase ?Continue zyrtec ? ?B12 deficiency/Vitamin D deficiency ?Labs utd ? ?Elevated glucose/Obesity (BMI 30-39.9) ?Patient was counseled on exercise, calorie counting, weight loss and potential medications to help with weight loss today. ?-Patient was provided with online resources for: Weekly net calorie calculator.  Applications for calorie counting.  Patient was advised to ensure she is taking in adequate nutrition daily by meeting calorie goals> it was encouraged to make sure she is consistently using a calorie tracker and ensuring she is calculating calories accurately by weighing proportions. ?-Patient was educated on dietary changes to not only lose weight but to eat healthy.  Patient was educated on glycemic index.  Would change breakfast to an omelette and avoid consuming biscuits. ?-Patient was educated on exercise goal of 150 minutes a week (plus warm up and cool down) of cardiovascular exercise.  Patient was educated on heart rate for cardiovascular and fat burning zones. ?-Patient was encouraged to maintain adequate water consumption of at least 100 ounces a day, more if exercising/sweating. ?A 1C and thyroid panel was normal. ?C-peptide and insulin resistance score was high at 87= insulin resistance ?Unfortunately she was on it able to tolerate Depade. ?We will attempt Wegovy start for insulin resistance and obesity with severe comorbidity. ? ? ?No orders of the defined types were placed in this encounter. ? ?Meds ordered this encounter  ?Medications  ? Semaglutide-Weight Management (WEGOVY) 0.25 MG/0.5ML SOAJ  ?  Sig: Inject 0.25 mg into the skin once a week.  ?  Dispense:  2 mL  ?  Refill:  1  ? venlafaxine XR (EFFEXOR XR) 75 MG 24 hr capsule  ?  Sig: Take 1 capsule (75 mg total) by mouth daily with breakfast.  ?  Dispense:  90 capsule  ?  Refill:  1  ? pantoprazole (PROTONIX) 40 MG tablet  ?  Sig: Take 1 tablet (40 mg total) by mouth daily.  ?  Dispense:  90 tablet  ?  Refill:   1  ? meloxicam (MOBIC) 15 MG tablet  ?  Sig: Take 1 tablet (15 mg total) by mouth daily.  ?  Dispense:  90 tablet  ?  Refill:  1  ? hydrOXYzine (ATARAX) 10 MG tablet  ?  Sig: 1/2 tab during the day and 2 tabs befo

## 2021-11-19 ENCOUNTER — Telehealth: Payer: Self-pay

## 2021-11-19 NOTE — Telephone Encounter (Signed)
Patient called to say insurance is not going to cover  ? ?Semaglutide-Weight Management (WEGOVY) 0.25 MG/0.5ML SOAJ [[id number]]  ?

## 2021-11-20 NOTE — Telephone Encounter (Signed)
Spoke with pt who will call and see what is on formulary list.  ?

## 2021-11-20 NOTE — Telephone Encounter (Signed)
LVM for pt to CB regarding medication. ? ?Note: will need to know what is on formulary for weight loss for insurance.   ?

## 2021-11-21 ENCOUNTER — Encounter: Payer: Self-pay | Admitting: Family Medicine

## 2021-11-21 ENCOUNTER — Telehealth: Payer: Self-pay

## 2021-11-21 ENCOUNTER — Other Ambulatory Visit: Payer: Self-pay | Admitting: Family Medicine

## 2021-11-21 DIAGNOSIS — Z1231 Encounter for screening mammogram for malignant neoplasm of breast: Secondary | ICD-10-CM

## 2021-11-21 MED ORDER — HYDROXYZINE HCL 10 MG PO TABS: ORAL_TABLET | ORAL | 1 refills | Status: DC

## 2021-11-21 MED ORDER — PANTOPRAZOLE SODIUM 40 MG PO TBEC: 40.0000 mg | DELAYED_RELEASE_TABLET | Freq: Every day | ORAL | 1 refills | Status: DC

## 2021-11-21 MED ORDER — MELOXICAM 15 MG PO TABS: 15.0000 mg | ORAL_TABLET | Freq: Every day | ORAL | 1 refills | Status: DC

## 2021-11-21 MED ORDER — FLUTICASONE PROPIONATE 50 MCG/ACT NA SUSP: 2.0000 | Freq: Every day | NASAL | 6 refills | Status: DC

## 2021-11-21 MED ORDER — VENLAFAXINE HCL ER 75 MG PO CP24: 75.0000 mg | ORAL_CAPSULE | Freq: Every day | ORAL | 1 refills | Status: DC

## 2021-11-21 NOTE — Telephone Encounter (Signed)
Patient wants to know if she can take "Berberine"? ? ?Please call [phone] ?

## 2021-11-21 NOTE — Telephone Encounter (Signed)
Please advise 

## 2021-11-22 ENCOUNTER — Encounter: Payer: Self-pay | Admitting: Family Medicine

## 2021-11-22 NOTE — Telephone Encounter (Signed)
Please advise if pt can have Mounjaro sent to pharmacy.  ?

## 2021-11-22 NOTE — Telephone Encounter (Signed)
Informed pt that this medication is for DM management., Pt will attempted to find a coupon for [facility].  ?

## 2021-11-22 NOTE — Telephone Encounter (Signed)
Is difficult to approve use of over the counter supplements for patient's, since they are not FDA regulated.  When supplements are not FDA regulated, the contents of the capsule and dosage etc. are not guaranteed.  Therefore safety cannot be guaranteed. ? ? ?As far as berberine itself, we simply don't know enough at this point to safely recommend it for those with diabetes or prediabetes. Typical side effects for a healthy individual with no medical conditions may include diarrhea, constipation, gas and upset stomach.  ?

## 2021-11-22 NOTE — Telephone Encounter (Signed)
Pt informed of pt instructions.  ?

## 2021-11-25 NOTE — Telephone Encounter (Signed)
Add'l questions on PA. Sat down with provider for assistance.  ?

## 2021-11-29 NOTE — Telephone Encounter (Signed)
Called pt to discuss remaining questions regarding PA.  ? ?Brent Bulla (Key: [id number]) ?Rx #: [id number] ?Wegovy 0.25MG /0.5ML auto-injectors ?  ?Form ?[facility] Plan WEGOVY [facility]) Form  ?Plan Contact ?[phone] phone ?[phone] fax ?

## 2021-12-03 ENCOUNTER — Encounter: Payer: Self-pay | Admitting: Family Medicine

## 2021-12-04 NOTE — Telephone Encounter (Signed)
Called pharmacy who informed me that coupon is only if medication is covered by insurance. Recommended that pt tries the monjouro coupon.  ? ?LVM for pt to CB regarding medication.  ?

## 2021-12-06 NOTE — Telephone Encounter (Signed)
Spoke with pt and informed that coupon will only be covered once the insurance covers their part.  ?

## 2021-12-19 ENCOUNTER — Ambulatory Visit (INDEPENDENT_AMBULATORY_CARE_PROVIDER_SITE_OTHER): Payer: 59

## 2021-12-19 ENCOUNTER — Ambulatory Visit: Payer: 59 | Admitting: Orthopedic Surgery

## 2021-12-19 ENCOUNTER — Telehealth: Payer: Self-pay | Admitting: Orthopedic Surgery

## 2021-12-19 DIAGNOSIS — M722 Plantar fascial fibromatosis: Secondary | ICD-10-CM

## 2021-12-19 DIAGNOSIS — M79672 Pain in left foot: Secondary | ICD-10-CM | POA: Diagnosis not present

## 2021-12-19 DIAGNOSIS — M6702 Short Achilles tendon (acquired), left ankle: Secondary | ICD-10-CM

## 2021-12-19 NOTE — Telephone Encounter (Signed)
Sent a message to her through [place].

## 2021-12-19 NOTE — Telephone Encounter (Signed)
Letter written and sent to pt through mychart.

## 2021-12-19 NOTE — Telephone Encounter (Signed)
Pt called requesting a call back. Pt is asking for a letter for her employer for yesterday, today and tomorrow's date. Pt had an appt today and forgot to ask for it. Please send to pt's mychart. Pt phone number is [phone]

## 2021-12-26 ENCOUNTER — Encounter: Payer: Self-pay | Admitting: Orthopedic Surgery

## 2021-12-26 NOTE — Progress Notes (Signed)
Office Visit Note   Patient: Brenda Ray           Date of Birth: Nov 26, 1970           MRN: [id number] Visit Date: 12/19/2021              Requested by: Natalia Leatherwood, DO [address] [place],  [place] [address] PCP: Natalia Leatherwood, DO  Chief Complaint  Patient presents with   Left Foot - Pain      HPI: Patient is a 51 year old woman who presents with 1 week history of plantar fascial heel pain.  Patient states she has been off her foot trying to rest.  Patient complains of pain with start up.  She states she has had a neuroma injection between the third and fourth toes.  Assessment & Plan: Visit Diagnoses:  1. Pain in left foot     Plan: Recommended Achilles stretching this was demonstrated recommended a stiff soled sneaker with arch supports and Voltaren gel.  Follow-Up Instructions: Return in about 2 months (around 02/18/2022).   Ortho Exam  Patient is alert, oriented, no adenopathy, well-dressed, normal affect, normal respiratory effort. Examination patient has good pulses she has pain to palpation of the origin of the plantar fascia lateral compression the calcaneus is nontender tarsal tunnel is nontender.  With her knee extended she has dorsiflexion 30 degrees short of neutral with significant Achilles contracture.  Imaging: No results found. No images are attached to the encounter.  Labs: Lab Results  Component Value Date   HGBA1C 5.1 09/30/2021   HGBA1C 4.9 10/01/2015     Lab Results  Component Value Date   ALBUMIN 4.6 09/30/2021   ALBUMIN 4.6 10/01/2015   ALBUMIN 3.9 05/13/2014    No results found for: MG Lab Results  Component Value Date   VD25OH 24.59 (L) 09/30/2021   VD25OH 23 (L) 09/21/2020   VD25OH 67.07 01/08/2016    No results found for: PREALBUMIN    Latest Ref Rng & Units 09/21/2020    1:56 PM 11/02/2017    8:38 AM 10/01/2015    9:51 AM  CBC EXTENDED  WBC 3.8 - 10.8 Thousand/uL 6.8   8.6   7.1    RBC 3.80 - 5.10 Million/uL 4.49    4.38   4.62    Hemoglobin 11.7 - 15.5 g/dL 10.1   75.1   02.5    HCT 35.0 - 45.0 % 38.5   38.4   41.8    Platelets 140 - 400 Thousand/uL 249   223   227.0    NEUT# 1,500 - 7,800 cells/uL 3,570    4.2    Lymph# 850 - 3,900 cells/uL 2,577    2.3       There is no height or weight on file to calculate BMI.  Orders:  Orders Placed This Encounter  Procedures   XR Foot Complete Left   No orders of the defined types were placed in this encounter.    Procedures: No procedures performed  Clinical Data: No additional findings.  ROS:  All other systems negative, except as noted in the HPI. Review of Systems  Objective: Vital Signs: There were no vitals taken for this visit.  Specialty Comments:  No specialty comments available.  PMFS History: Patient Active Problem List   Diagnosis Date Noted   Insulin resistance 10/08/2021   Obesity with serious comorbidity 10/08/2021   Allergic rhinitis 08/29/2021   GERD without esophagitis 08/22/2020  Chronic shoulder pain 08/02/2018   Benzodiazepine contract exists 10/05/2017   Anxiety 10/02/2017   Hot flashes due to menopause 10/02/2017   Stress incontinence 10/01/2015   Past Medical History:  Diagnosis Date   Abnormal Pap smear of cervix    age 25   Anemia    Anxiety    use to use / take Lorazepam ., last episode of panic  2 yrs. ago   Arthritis    hands, knees, back - Osteo   Colon polyps    she reports colon polyps about 20 years ago; she was told 5 yr follow up.    Complication of anesthesia    woke up before the tube was removed    Cystitis    Facial nerve disorder    GERD (gastroesophageal reflux disease)    Hiatal hernia    reports seen on [facility]GD completed in her 20s [facility]. Completed for pain and FOBT +.    History of chicken pox    Pneumonia    as a child- hosp.    Vaginal delivery    x2    Family History  Problem Relation Age of Onset   Arthritis Mother    Hyperlipidemia Mother    Hypertension  Mother    Thyroid disease Mother        hypothyroid   Arthritis Father    Hyperlipidemia Father    Hypertension Father    Bladder Cancer Father    Prostate cancer Father    Thyroid disease Brother    Arthritis Maternal Grandmother    Arthritis Maternal Grandfather    Arthritis Paternal Grandmother    Arthritis Paternal Grandfather    Breast cancer Maternal Aunt 49    Past Surgical History:  Procedure Laterality Date   ANTERIOR CERVICAL DECOMP/DISCECTOMY FUSION  2017   C3-C5   BREAST BIOPSY Left 2014   fatty tissue only   CERVICAL DISCECTOMY  2014   CHOLECYSTECTOMY N/A 06/16/2014   Procedure: LAPAROSCOPIC CHOLECYSTECTOMY ;  Surgeon: Abigail Miyamoto, MD;  Location: [facility] OR;  Service: General;  Laterality: N/A;  laparoscopic cholecystectomy   GYNECOLOGIC CRYOSURGERY     age 40   LAPAROSCOPIC ABDOMINAL EXPLORATION     for endometrimosis    Social History   Occupational History   Not on file  Tobacco Use   Smoking status: Former    Packs/day: 0.50    Years: 36.00    Pack years: 18.00    Types: Cigarettes, E-cigarettes    Quit date: 02/25/2017    Years since quitting: 4.8   Smokeless tobacco: Former  Building services engineer Use: Former  Substance and Sexual Activity   Alcohol use: Not Currently   Drug use: No   Sexual activity: Yes    Birth control/protection: None

## 2022-01-19 ENCOUNTER — Other Ambulatory Visit: Payer: Self-pay | Admitting: Family Medicine

## 2022-01-23 ENCOUNTER — Ambulatory Visit: Payer: 59 | Admitting: Family Medicine

## 2022-02-06 ENCOUNTER — Encounter: Payer: Self-pay | Admitting: Family Medicine

## 2022-02-06 ENCOUNTER — Ambulatory Visit (INDEPENDENT_AMBULATORY_CARE_PROVIDER_SITE_OTHER): Payer: 59 | Admitting: Family Medicine

## 2022-02-06 VITALS — BP 111/72 | HR 85 | Temp 98.1°F | Ht 65.0 in | Wt 196.0 lb

## 2022-02-06 DIAGNOSIS — Z1231 Encounter for screening mammogram for malignant neoplasm of breast: Secondary | ICD-10-CM

## 2022-02-06 DIAGNOSIS — Z6832 Body mass index (BMI) 32.0-32.9, adult: Secondary | ICD-10-CM

## 2022-02-06 DIAGNOSIS — E66811 Obesity, class 1: Secondary | ICD-10-CM

## 2022-02-06 DIAGNOSIS — F419 Anxiety disorder, unspecified: Secondary | ICD-10-CM | POA: Diagnosis not present

## 2022-02-06 DIAGNOSIS — N951 Menopausal and female climacteric states: Secondary | ICD-10-CM | POA: Diagnosis not present

## 2022-02-06 DIAGNOSIS — K219 Gastro-esophageal reflux disease without esophagitis: Secondary | ICD-10-CM | POA: Diagnosis not present

## 2022-02-06 DIAGNOSIS — E8881 Metabolic syndrome: Secondary | ICD-10-CM

## 2022-02-06 DIAGNOSIS — M25519 Pain in unspecified shoulder: Secondary | ICD-10-CM | POA: Diagnosis not present

## 2022-02-06 DIAGNOSIS — E6609 Other obesity due to excess calories: Secondary | ICD-10-CM

## 2022-02-06 DIAGNOSIS — E88819 Insulin resistance, unspecified: Secondary | ICD-10-CM

## 2022-02-06 DIAGNOSIS — G8929 Other chronic pain: Secondary | ICD-10-CM

## 2022-02-06 MED ORDER — VENLAFAXINE HCL ER 150 MG PO CP24: 150.0000 mg | ORAL_CAPSULE | Freq: Every day | ORAL | 1 refills | Status: DC

## 2022-02-06 MED ORDER — PANTOPRAZOLE SODIUM 40 MG PO TBEC
40.0000 mg | DELAYED_RELEASE_TABLET | Freq: Every day | ORAL | 1 refills | Status: DC
Start: 2022-02-06 — End: 2022-11-28

## 2022-02-06 MED ORDER — BUSPIRONE HCL 10 MG PO TABS: 10.0000 mg | ORAL_TABLET | Freq: Two times a day (BID) | ORAL | 5 refills | Status: DC

## 2022-02-06 MED ORDER — HYDROXYZINE HCL 10 MG PO TABS
ORAL_TABLET | ORAL | 1 refills | Status: DC
Start: 2022-02-06 — End: 2022-12-01

## 2022-02-06 MED ORDER — MELOXICAM 15 MG PO TABS
15.0000 mg | ORAL_TABLET | Freq: Every day | ORAL | 1 refills | Status: DC
Start: 2022-02-06 — End: 2022-11-28

## 2022-02-06 NOTE — Patient Instructions (Signed)
No follow-ups on file.        Great to see you today.  I have refilled the medication(s) we provide.   If labs were collected, we will inform you of lab results once received either by echart message or telephone call.   - echart message- for normal results that have been seen by the patient already.   - telephone call: abnormal results or if patient has not viewed results in their echart.  

## 2022-02-06 NOTE — Progress Notes (Signed)
Patient Care Team    Relationship Specialty Notifications Start End  Natalia Leatherwood, DO PCP - General Family Medicine  05/01/15      SUBJECTIVE Chief Complaint  Patient presents with   Anxiety    Cmc; pt is not fasting   HPI: Brenda Ray is a 51 y.o. female present for [facility] follow up.  Anxiety/hot flashes/fatigue:  Patient reports her anxiety is starting to increase a small amount.  She states initially she thought the Effexor 75 mg daily and the BuSpar twice daily was working great, but now she started to get that anxious shaky feeling again and thinks maybe she should go up on the Effexor.  She thinks the vitamin supplements have helped with the fatigue.  She also is using the Atarax on occasions and feels it is working well for her. Tried: paxil (sleepy), ativan (sleepy at low doses)  Shoulder pain: She is she has continued to use the meloxicam with flares of her shoulder pain.  GERD:she is compliant with protonix 40 mg qd.  Needs refills today.   Allergies: Patient is compliant with Zyrtec nightly and Flonase nasal spray daily, this combination seems to work well for her.  Review of Systems  All other systems reviewed and are negative.      02/06/2022   12:11 PM 09/30/2021    8:23 AM 07/10/2021    3:42 PM 11/02/2020   10:59 AM 09/21/2020    1:47 PM  Depression screen PHQ 2/9  Decreased Interest 0 1 0 0 1  Down, Depressed, Hopeless 1 0 0 1 1  PHQ - 2 Score 1 1 0 1 2  Altered sleeping 1 2  0 2  Tired, decreased energy 0 3  0 3  Change in appetite 1 0  0 3  Feeling bad or failure about yourself  0 0  0 0  Trouble concentrating 0 0  0 0  Moving slowly or fidgety/restless 0 0  0 1  Suicidal thoughts 0 0  0 0  PHQ-9 Score 3 6  1 11       02/06/2022   12:12 PM 09/30/2021    8:24 AM 11/02/2020   11:00 AM 09/21/2020    1:48 PM  GAD 7 : Generalized Anxiety Score  Nervous, Anxious, on Edge 1 1 1 2   Control/stop worrying 0 0 0 2  Worry too much - different things 0 1  1 2   Trouble relaxing 1  0 1  Restless 0 0 0 1  Easily annoyed or irritable 0 0 0 1  Afraid - awful might happen 0 0 0 0  Total GAD 7 Score 2  2 9      Patient Active Problem List   Diagnosis Date Noted   Insulin resistance 10/08/2021   Obesity with serious comorbidity 10/08/2021   Allergic rhinitis 08/29/2021   GERD without esophagitis 08/22/2020   Chronic shoulder pain 08/02/2018   Benzodiazepine contract exists 10/05/2017   Anxiety 10/02/2017   Hot flashes due to menopause 10/02/2017   Stress incontinence 10/01/2015    Social History   Tobacco Use   Smoking status: Former    Packs/day: 0.50    Years: 36.00    Total pack years: 18.00    Types: Cigarettes, E-cigarettes    Quit date: 02/25/2017    Years since quitting: 4.9   Smokeless tobacco: Former  Substance Use Topics   Alcohol use: Not Currently    Current Outpatient Medications:  cetirizine (ZYRTEC) 10 MG tablet, Take 1 tablet (10 mg total) by mouth at bedtime., Disp: 90 tablet, Rfl: 3   Cholecalciferol (VITAMIN D-3) 25 MCG (1000 UT) CAPS, Take by mouth., Disp: , Rfl:    fluticasone (FLONASE) 50 MCG/ACT nasal spray, Place 2 sprays into both nostrils daily., Disp: 16 g, Rfl: 6   busPIRone (BUSPAR) 10 MG tablet, Take 1 tablet (10 mg total) by mouth 2 (two) times daily., Disp: 60 tablet, Rfl: 5   hydrOXYzine (ATARAX) 10 MG tablet, 1/2 tab during the day and 2 tabs before bed. Can take every 8 hours as needed., Disp: 90 tablet, Rfl: 1   meloxicam (MOBIC) 15 MG tablet, Take 1 tablet (15 mg total) by mouth daily., Disp: 90 tablet, Rfl: 1   pantoprazole (PROTONIX) 40 MG tablet, Take 1 tablet (40 mg total) by mouth daily., Disp: 90 tablet, Rfl: 1   venlafaxine XR (EFFEXOR XR) 150 MG 24 hr capsule, Take 1 capsule (150 mg total) by mouth daily with breakfast., Disp: 90 capsule, Rfl: 1  Allergies  Allergen Reactions   Augmentin [Amoxicillin-Pot Clavulanate] Diarrhea   Doxycycline Nausea And Vomiting   Hydrocodone Itching     OBJECTIVE: BP 111/72   Pulse 85   Temp 98.1 F (36.7 C) (Oral)   Ht 5\' 5"  (1.651 m)   Wt 196 lb (88.9 kg)   SpO2 96%   BMI 32.62 kg/m  Physical Exam Vitals and nursing note reviewed.  Constitutional:      General: She is not in acute distress.    Appearance: Normal appearance. She is not ill-appearing, toxic-appearing or diaphoretic.  HENT:     Head: Normocephalic and atraumatic.  Eyes:     General: No scleral icterus.       Right eye: No discharge.        Left eye: No discharge.     Extraocular Movements: Extraocular movements intact.     Conjunctiva/sclera: Conjunctivae normal.     Pupils: Pupils are equal, round, and reactive to light.  Cardiovascular:     Rate and Rhythm: Normal rate and regular rhythm.  Pulmonary:     Effort: Pulmonary effort is normal. No respiratory distress.     Breath sounds: Normal breath sounds. No wheezing, rhonchi or rales.  Musculoskeletal:     Cervical back: Neck supple. No tenderness.     Right lower leg: No edema.     Left lower leg: No edema.  Lymphadenopathy:     Cervical: No cervical adenopathy.  Skin:    General: Skin is warm and dry.     Coloration: Skin is not jaundiced or pale.     Findings: No erythema or rash.  Neurological:     Mental Status: She is alert and oriented to person, place, and time. Mental status is at baseline.     Motor: No weakness.     Gait: Gait normal.  Psychiatric:        Mood and Affect: Mood normal.        Behavior: Behavior normal.        Thought Content: Thought content normal.        Judgment: Judgment normal.      ASSESSMENT AND PLAN: Brenda Ray is a 51 y.o. female present for  Chronic pain of left shoulder Stable. Continue Mobic 15 mg daily.  GERD without esophagitis Stable. Continue Protonix   Non-seasonal allergic rhinitis due to pollen Continue Flonase  Continue zyrtec  B12 deficiency/Vitamin D deficiency Labs UTD  and supplementing  Anxiety/Hot flashes due to  menopauseBenzodiazepine contract exists/panic Could use additional coverage. Tried: celexa. Increase effexor 75 mg > 150 mg daily Continue BuSpar twice daily  - TSH-wnl 08/2020 Follow-up 5.5 months    B12 deficiency/Vitamin D deficiency Labs utd   Return in about 24 weeks (around 07/24/2022) for Routine chronic condition follow-up.   Orders Placed This Encounter  Procedures   MM 3D SCREEN BREAST BILATERAL   Meds ordered this encounter  Medications   busPIRone (BUSPAR) 10 MG tablet    Sig: Take 1 tablet (10 mg total) by mouth 2 (two) times daily.    Dispense:  60 tablet    Refill:  5   hydrOXYzine (ATARAX) 10 MG tablet    Sig: 1/2 tab during the day and 2 tabs before bed. Can take every 8 hours as needed.    Dispense:  90 tablet    Refill:  1   meloxicam (MOBIC) 15 MG tablet    Sig: Take 1 tablet (15 mg total) by mouth daily.    Dispense:  90 tablet    Refill:  1   pantoprazole (PROTONIX) 40 MG tablet    Sig: Take 1 tablet (40 mg total) by mouth daily.    Dispense:  90 tablet    Refill:  1   venlafaxine XR (EFFEXOR XR) 150 MG 24 hr capsule    Sig: Take 1 capsule (150 mg total) by mouth daily with breakfast.    Dispense:  90 capsule    Refill:  1   Referral Orders  No referral(s) requested today      Felix Pacini, DO 02/06/2022

## 2022-02-07 ENCOUNTER — Encounter: Payer: Self-pay | Admitting: Family Medicine

## 2022-02-07 NOTE — Telephone Encounter (Signed)
Please advise 

## 2022-02-19 ENCOUNTER — Other Ambulatory Visit: Payer: Self-pay

## 2022-02-19 ENCOUNTER — Encounter (HOSPITAL_BASED_OUTPATIENT_CLINIC_OR_DEPARTMENT_OTHER): Payer: Self-pay | Admitting: Emergency Medicine

## 2022-02-19 ENCOUNTER — Emergency Department (HOSPITAL_BASED_OUTPATIENT_CLINIC_OR_DEPARTMENT_OTHER)
Admission: EM | Admit: 2022-02-19 | Discharge: 2022-02-20 | Disposition: A | Discharge status: Home / Self Care | Payer: 59 | Attending: Emergency Medicine | Admitting: Emergency Medicine

## 2022-02-19 ENCOUNTER — Emergency Department (HOSPITAL_BASED_OUTPATIENT_CLINIC_OR_DEPARTMENT_OTHER): Payer: 59

## 2022-02-19 DIAGNOSIS — R1030 Lower abdominal pain, unspecified: Secondary | ICD-10-CM | POA: Insufficient documentation

## 2022-02-19 DIAGNOSIS — R350 Frequency of micturition: Secondary | ICD-10-CM | POA: Diagnosis not present

## 2022-02-19 DIAGNOSIS — N281 Cyst of kidney, acquired: Secondary | ICD-10-CM | POA: Diagnosis not present

## 2022-02-19 DIAGNOSIS — N12 Tubulo-interstitial nephritis, not specified as acute or chronic: Secondary | ICD-10-CM

## 2022-02-19 LAB — URINALYSIS, MICROSCOPIC (REFLEX)

## 2022-02-19 LAB — CBC
HCT: 39 % (ref 36.0–46.0)
Hemoglobin: 13.6 g/dL (ref 12.0–15.0)
MCH: 29.8 pg (ref 26.0–34.0)
MCHC: 34.9 g/dL (ref 30.0–36.0)
MCV: 85.3 fL (ref 80.0–100.0)
Platelets: 231 10*3/uL (ref 150–400)
RBC: 4.57 MIL/uL (ref 3.87–5.11)
RDW: 12.8 % (ref 11.5–15.5)
WBC: 9 10*3/uL (ref 4.0–10.5)
nRBC: 0 % (ref 0.0–0.2)

## 2022-02-19 LAB — BASIC METABOLIC PANEL
Anion gap: 6 (ref 5–15)
BUN: 11 mg/dL (ref 6–20)
CO2: 29 mmol/L (ref 22–32)
Calcium: 9.4 mg/dL (ref 8.9–10.3)
Chloride: 103 mmol/L (ref 98–111)
Creatinine, Ser: 0.95 mg/dL (ref 0.44–1.00)
GFR, Estimated: >60 mL/min (ref >60)
Glucose, Bld: 100 mg/dL — ABNORMAL HIGH (ref 70–99)
Potassium: 3.8 mmol/L (ref 3.5–5.1)
Sodium: 138 mmol/L (ref 135–145)

## 2022-02-19 LAB — PREGNANCY, URINE: Preg Test, Ur: NEGATIVE

## 2022-02-19 LAB — URINALYSIS, ROUTINE W REFLEX MICROSCOPIC
Bilirubin Urine: NEGATIVE
Glucose, UA: NEGATIVE mg/dL
Ketones, ur: NEGATIVE mg/dL
Nitrite: NEGATIVE
Protein, ur: NEGATIVE mg/dL
Specific Gravity, Urine: 1.01 (ref 1.005–1.030)
pH: 7 (ref 5.0–8.0)

## 2022-02-19 MED ORDER — SODIUM CHLORIDE 0.9 % IV SOLN
1.0000 g | Freq: Once | INTRAVENOUS | Status: AC
  Administered 2022-02-19: 1 g via INTRAVENOUS
  Filled 2022-02-19: qty 10

## 2022-02-19 MED ORDER — FENTANYL CITRATE PF 50 MCG/ML IJ SOSY
50.0000 ug | PREFILLED_SYRINGE | Freq: Once | INTRAMUSCULAR | Status: AC
  Administered 2022-02-19: 50 ug via INTRAVENOUS
  Filled 2022-02-19: qty 1

## 2022-02-19 MED ORDER — OXYCODONE HCL 5 MG PO TABS: 5.0000 mg | ORAL_TABLET | Freq: Four times a day (QID) | ORAL | 0 refills | Status: DC | PRN

## 2022-02-19 MED ORDER — ONDANSETRON HCL 4 MG/2ML IJ SOLN
4.0000 mg | Freq: Once | INTRAMUSCULAR | Status: AC
  Administered 2022-02-19: 4 mg via INTRAVENOUS
  Filled 2022-02-19: qty 2

## 2022-02-19 MED ORDER — CEPHALEXIN 500 MG PO CAPS: 500.0000 mg | ORAL_CAPSULE | Freq: Four times a day (QID) | ORAL | 0 refills | Status: DC

## 2022-02-19 MED ORDER — ONDANSETRON HCL 4 MG PO TABS: 4.0000 mg | ORAL_TABLET | Freq: Three times a day (TID) | ORAL | 0 refills | Status: DC | PRN

## 2022-02-19 NOTE — Discharge Instructions (Addendum)
Contact a health care provider if:  Your symptoms do not get better after 2 days of treatment.  Your symptoms get worse.  You have a fever.  Get help right away if you:  Are unable to take your antibiotics or fluids.  Have shaking chills.  Vomit.  Have severe flank or back pain.  Have extreme weakness or fainting.

## 2022-02-19 NOTE — ED Triage Notes (Signed)
Right sided flank pain that started earlier today. Also c/o burning with urination and mild nausea.  Concerned for kidney stone.

## 2022-02-19 NOTE — ED Provider Notes (Signed)
[facility] Provider Note   CSN: [id number] Arrival date & time: 02/19/22  2140     History  Chief Complaint  Patient presents with   Flank Pain    Brenda Ray is a 51 y.o. female who presents emergency department with chief complaint of urinary frequency, urgency and flank pain.  Patient states that she began having suprapubic pain and urgency and frequency of urination today.  She developed pain in her right flank that became progressively worse.  She rates it at 8 out of 10.  It is constant, nothing makes it worse or better.  She was continued concerned she might have a kidney stone but has no previous history of such.  She denies fevers or chills   Flank Pain       Home Medications Prior to Admission medications   Medication Sig Start Date End Date Taking? Authorizing Provider  busPIRone (BUSPAR) 10 MG tablet Take 1 tablet (10 mg total) by mouth 2 (two) times daily. 02/06/22   Kuneff, Renee A, DO  cetirizine (ZYRTEC) 10 MG tablet Take 1 tablet (10 mg total) by mouth at bedtime. 08/29/21   Kuneff, Renee A, DO  Cholecalciferol (VITAMIN D-3) 25 MCG (1000 UT) CAPS Take by mouth.    [provider]  fluticasone (FLONASE) 50 MCG/ACT nasal spray Place 2 sprays into both nostrils daily. 11/21/21   Kuneff, Renee A, DO  hydrOXYzine (ATARAX) 10 MG tablet 1/2 tab during the day and 2 tabs before bed. Can take every 8 hours as needed. 02/06/22   Kuneff, Renee A, DO  meloxicam (MOBIC) 15 MG tablet Take 1 tablet (15 mg total) by mouth daily. 02/06/22   Kuneff, Renee A, DO  pantoprazole (PROTONIX) 40 MG tablet Take 1 tablet (40 mg total) by mouth daily. 02/06/22   Kuneff, Renee A, DO  venlafaxine XR (EFFEXOR XR) 150 MG 24 hr capsule Take 1 capsule (150 mg total) by mouth daily with breakfast. 02/06/22   Kuneff, Renee A, DO      Allergies    Augmentin [amoxicillin-pot clavulanate], Doxycycline, and Hydrocodone    Review of Systems   Review of Systems   Genitourinary:  Positive for flank pain.    Physical Exam Updated Vital Signs BP 125/74 (BP Location: Left Arm)   Pulse 82   Temp 98 F (36.7 C)   Resp 17   Ht 5\' 5"  (1.651 m)   Wt 86.6 kg   SpO2 96%   BMI 31.78 kg/m  Physical Exam Vitals and nursing note reviewed.  Constitutional:      General: She is not in acute distress.    Appearance: She is well-developed. She is not diaphoretic.  HENT:     Head: Normocephalic and atraumatic.     Right Ear: External ear normal.     Left Ear: External ear normal.     Nose: Nose normal.     Mouth/Throat:     Mouth: Mucous membranes are moist.  Eyes:     General: No scleral icterus.    Conjunctiva/sclera: Conjunctivae normal.  Cardiovascular:     Rate and Rhythm: Normal rate and regular rhythm.     Heart sounds: Normal heart sounds. No murmur heard.    No friction rub. No gallop.  Pulmonary:     Effort: Pulmonary effort is normal. No respiratory distress.     Breath sounds: Normal breath sounds.  Abdominal:     General: Bowel sounds are normal. There is no distension.  Palpations: Abdomen is soft. There is no mass.     Tenderness: There is abdominal tenderness in the suprapubic area. There is right CVA tenderness. There is no guarding.  Musculoskeletal:     Cervical back: Normal range of motion.  Skin:    General: Skin is warm and dry.  Neurological:     Mental Status: She is alert and oriented to person, place, and time.  Psychiatric:        Behavior: Behavior normal.     ED Results / Procedures / Treatments   Labs (all labs ordered are listed, but only abnormal results are displayed) Labs Reviewed  URINALYSIS, ROUTINE W REFLEX MICROSCOPIC - Abnormal; Notable for the following components:      Result Value   Hgb urine dipstick LARGE (*)    Leukocytes,Ua MODERATE (*)    All other components within normal limits  BASIC METABOLIC PANEL - Abnormal; Notable for the following components:   Glucose, Bld 100 (*)    All  other components within normal limits  URINALYSIS, MICROSCOPIC (REFLEX) - Abnormal; Notable for the following components:   Bacteria, UA FEW (*)    All other components within normal limits  URINE CULTURE  PREGNANCY, URINE  CBC    EKG None  Radiology CT Renal Stone Study  Result Date: 02/19/2022 CLINICAL DATA:  Flank pain, kidney stone suspected. Burning urination with nausea. EXAM: CT ABDOMEN AND PELVIS WITHOUT CONTRAST TECHNIQUE: Multidetector CT imaging of the abdomen and pelvis was performed following the standard protocol without IV contrast. RADIATION DOSE REDUCTION: This exam was performed according to the departmental dose-optimization program which includes automated exposure control, adjustment of the mA and/or kV according to patient size and/or use of iterative reconstruction technique. COMPARISON:  None Available. FINDINGS: Lower chest: No acute abnormality. Hepatobiliary: No focal liver abnormality is seen. Status post cholecystectomy. No biliary dilatation. Pancreas: Unremarkable. No pancreatic ductal dilatation or surrounding inflammatory changes. Spleen: Normal in size without focal abnormality. Adrenals/Urinary Tract: Adrenal glands are unremarkable. No evidence of nephrolithiasis or hydronephrosis. No ureteral calculus. Exophytic cyst in the lower pole of the left kidney. There is another small interpolar left renal cyst. Bladder is unremarkable. Stomach/Bowel: Stomach is within normal limits. Appendix appears normal. No evidence of bowel wall thickening, distention, or inflammatory changes. Vascular/Lymphatic: Mild aortic atherosclerosis. No enlarged abdominal or pelvic lymph nodes. Reproductive: Uterus and bilateral adnexa are unremarkable. Other: No abdominal wall hernia or abnormality. No abdominopelvic ascites. Musculoskeletal: Mild degenerative disease of the lumbar spine with associated facet joint arthropathy. IMPRESSION: 1. No evidence of nephrolithiasis or hydronephrosis.  No ureteral calculus or evidence of recently passed calculus. Evaluation of pyelonephritis is limited on this noncontrast enhanced examination, no perinephric fat stranding or fluid collection. Urinalysis could be obtained for further evaluation if clinically warranted. 2. Simple cyst in the lower and interpolar region of the left kidney. In the absence of clinical signs and symptoms, no further follow-up is recommended. 3. Urinary bladder is underdistended limiting evaluation. 4. Bowel loops are normal in caliber. Normal appendix. No evidence of colitis or diverticulitis. Electronically Signed   By: Larose Hires D.O.   On: 02/19/2022 22:59    Procedures Procedures    Medications Ordered in ED Medications  fentaNYL (SUBLIMAZE) injection 50 mcg (has no administration in time range)  ondansetron (ZOFRAN) injection 4 mg (has no administration in time range)  cefTRIAXone (ROCEPHIN) 1 g in sodium chloride 0.9 % 100 mL IVPB (has no administration in time range)  ED Course/ Medical Decision Making/ A&P                           Medical Decision Making 51 year old female who presents with flank pain. The differential diagnosis of emergent flank pain includes, but is not limited to :Abdominal aortic aneurysm,, Renal artery embolism,Renal vein thrombosis, Aortic dissection, Mesenteric ischemia, Pyelonephritis, Renal infarction, Renal hemorrhage, Nephrolithiasis/ Renal Colic, Bladder tumor,Cystitis, Biliary colic, Pancreatitis Perforated peptic ulcer Appendicitis ,Inguinal Hernia, Diverticulitis, Bowel obstruction PID/TOA,Ovarian cyst, Ovarian torsion Shingles Lower lobe pneumonia, Retroperitoneal hematoma/abscess/tumor, Epidural abscess, Epidural hematoma After review of all data points I suspect the patient has likely early pyelonephritis although there is no perinephric stranding.  She does have CVA tenderness and UTI symptoms with suprapubic tenderness.  Patient given pain control here.  We will  treat with Rocephin.  Labs appear to show urinary tract infection, no evidence of uropathy, I visualized and interpreted the CT images which show no acute findings.   Amount and/or Complexity of Data Reviewed Labs: ordered. Radiology: ordered.  Risk Prescription drug management.  PDMP reviewed during this encounter.          Final Clinical Impression(s) / ED Diagnoses Final diagnoses:  None    Rx / DC Orders ED Discharge Orders     None         Arthor Captain, PA-C 02/19/22 2343    Terrilee Files, MD 02/20/22 [phone]

## 2022-02-20 ENCOUNTER — Ambulatory Visit: Payer: 59 | Admitting: Orthopedic Surgery

## 2022-02-20 NOTE — ED Notes (Signed)
D/c paperwork reviewed with pt, including prescriptions.  Pt with no questions or concerns at time of d/c. Ambulatory to ED exit without assistance, accompanied by s/o.

## 2022-02-22 LAB — URINE CULTURE: Culture: >=100000 — AB

## 2022-02-23 ENCOUNTER — Telehealth (HOSPITAL_BASED_OUTPATIENT_CLINIC_OR_DEPARTMENT_OTHER): Payer: Self-pay | Admitting: *Deleted

## 2022-02-23 NOTE — Telephone Encounter (Signed)
Post ED Visit - Positive Culture Follow-up  Culture report reviewed by antimicrobial stewardship pharmacist: [facility] []  , Pharm.D. []  Enzo Bi, Pharm.D., BCPS AQ-ID []  , Pharm.D., BCPS []  Celedonio Miyamoto, .D., BCPS []  [place], .D., BCPS, AAHIVP []  Georgina Pillion, Pharm.D., BCPS, AAHIVP []  [address], PharmD, BCPS []  , PharmD, BCPS []  [place], PharmD, BCPS []  [place], PharmD []  , PharmD, BCPS [x]  Estella Husk, PharmD  Pharmacy Team []  Lysle Pearl, PharmD []  , PharmD []  Phillips Climes, PharmD []  , Rph []  Agapito Games) , PharmD []  Verlan Friends, PharmD []  , PharmD []  Mervyn Gay, PharmD []  , PharmD []  Delmar Landau, PharmD []  [facility], PharmD []  , PharmD []  Len Childs, PharmD   Positive urine culture Treated with Cephalexin, organism sensitive to the same and no further patient follow-up is required at this time.  02/23/2022, 12:38 PM

## 2022-05-12 ENCOUNTER — Ambulatory Visit (INDEPENDENT_AMBULATORY_CARE_PROVIDER_SITE_OTHER): Payer: Self-pay | Admitting: Family Medicine

## 2022-05-12 ENCOUNTER — Encounter: Payer: Self-pay | Admitting: Family Medicine

## 2022-05-12 ENCOUNTER — Ambulatory Visit: Payer: Self-pay | Admitting: Family Medicine

## 2022-05-12 VITALS — BP 105/70 | HR 81 | Temp 98.2°F | Ht 65.0 in | Wt 190.4 lb

## 2022-05-12 DIAGNOSIS — R0981 Nasal congestion: Secondary | ICD-10-CM

## 2022-05-12 DIAGNOSIS — J029 Acute pharyngitis, unspecified: Secondary | ICD-10-CM

## 2022-05-12 DIAGNOSIS — H6991 Unspecified Eustachian tube disorder, right ear: Secondary | ICD-10-CM

## 2022-05-12 DIAGNOSIS — H6121 Impacted cerumen, right ear: Secondary | ICD-10-CM

## 2022-05-12 DIAGNOSIS — I889 Nonspecific lymphadenitis, unspecified: Secondary | ICD-10-CM

## 2022-05-12 DIAGNOSIS — J019 Acute sinusitis, unspecified: Secondary | ICD-10-CM

## 2022-05-12 DIAGNOSIS — H9201 Otalgia, right ear: Secondary | ICD-10-CM

## 2022-05-12 LAB — POC COVID19 BINAXNOW: SARS Coronavirus 2 Ag: NEGATIVE

## 2022-05-12 LAB — POCT RAPID STREP A (OFFICE): Rapid Strep A Screen: NEGATIVE

## 2022-05-12 LAB — POCT INFLUENZA A/B
Influenza A, POC: NEGATIVE
Influenza B, POC: NEGATIVE

## 2022-05-12 MED ORDER — CEPHALEXIN 500 MG PO CAPS: 500.0000 mg | ORAL_CAPSULE | Freq: Three times a day (TID) | ORAL | 0 refills | Status: AC

## 2022-05-12 NOTE — Progress Notes (Signed)
OFFICE VISIT  05/12/2022  CC:  Chief Complaint  Patient presents with   Ear Pain    X1 week; sharp pain and feels swollen   Neck Pain    Feels like she may have fluid, has nasal congestion. She has had chills and sore throat, productive cough with phlegm (whitish- yellow color) but no n/v/d.    Patient is a 51 y.o. female who presents for ear pain, neck pain.  HPI: About 1 week of increased nasal congestion, postnasal drip, some sore throat.  Right ear feels full and intermittent sharp pains, says she feels like the right ear is swollen. The right ear pain extends down the right side of neck. No fever, body aches, or fatigue.     Past Medical History:  Diagnosis Date   Abnormal Pap smear of cervix    age 32   Anemia    Anxiety    use to use / take Lorazepam ., last episode of panic  2 yrs. ago   Arthritis    hands, knees, back - Osteo   Colon polyps    she reports colon polyps about 20 years ago; she was told 5 yr follow up.    Complication of anesthesia    woke up before the tube was removed    Cystitis    Facial nerve disorder    GERD (gastroesophageal reflux disease)    Hiatal hernia    reports seen on EGD completed in her 67s in [place]. Completed for pain and FOBT +.    History of chicken pox    Pneumonia    as a child- hosp.    Vaginal delivery    x2    Past Surgical History:  Procedure Laterality Date   ANTERIOR CERVICAL DECOMP/DISCECTOMY FUSION  2017   C3-C5   BREAST BIOPSY Left 2014   fatty tissue only   CERVICAL DISCECTOMY  2014   CHOLECYSTECTOMY N/A 06/16/2014   Procedure: LAPAROSCOPIC CHOLECYSTECTOMY ;  Surgeon: Coralie Keens, MD;  Location: [facility];  Service: General;  Laterality: N/A;  laparoscopic cholecystectomy   GYNECOLOGIC CRYOSURGERY     age 22   LAPAROSCOPIC ABDOMINAL EXPLORATION     for endometrimosis     Outpatient Medications Prior to Visit  Medication Sig Dispense Refill   busPIRone (BUSPAR) 10 MG tablet Take 1 tablet (10 mg  total) by mouth 2 (two) times daily. 60 tablet 5   Cholecalciferol (VITAMIN D-3) 25 MCG (1000 UT) CAPS Take by mouth.     fluticasone (FLONASE) 50 MCG/ACT nasal spray Place 2 sprays into both nostrils daily. 16 g 6   meloxicam (MOBIC) 15 MG tablet Take 1 tablet (15 mg total) by mouth daily. 90 tablet 1   pantoprazole (PROTONIX) 40 MG tablet Take 1 tablet (40 mg total) by mouth daily. 90 tablet 1   venlafaxine XR (EFFEXOR XR) 150 MG 24 hr capsule Take 1 capsule (150 mg total) by mouth daily with breakfast. 90 capsule 1   cetirizine (ZYRTEC) 10 MG tablet Take 1 tablet (10 mg total) by mouth at bedtime. (Patient not taking: Reported on 05/12/2022) 90 tablet 3   hydrOXYzine (ATARAX) 10 MG tablet 1/2 tab during the day and 2 tabs before bed. Can take every 8 hours as needed. (Patient not taking: Reported on 05/12/2022) 90 tablet 1   ondansetron (ZOFRAN) 4 MG tablet Take 1 tablet (4 mg total) by mouth every 8 (eight) hours as needed for nausea or vomiting. (Patient not taking: Reported on 05/12/2022)  10 tablet 0   oxyCODONE (ROXICODONE) 5 MG immediate release tablet Take 1 tablet (5 mg total) by mouth every 6 (six) hours as needed for severe pain. (Patient not taking: Reported on 05/12/2022) 10 tablet 0   cephALEXin (KEFLEX) 500 MG capsule Take 1 capsule (500 mg total) by mouth 4 (four) times daily. (Patient not taking: Reported on 05/12/2022) 28 capsule 0   No facility-administered medications prior to visit.    Allergies  Allergen Reactions   Augmentin [Amoxicillin-Pot Clavulanate] Diarrhea   Doxycycline Nausea And Vomiting   Hydrocodone Itching    ROS As per HPI  PE:    05/12/2022   11:07 AM 02/19/2022   11:30 PM 02/19/2022   11:19 PM  Vitals with BMI  Height 5\' 5"     Weight 190 lbs 6 oz    BMI 31.68    Systolic 105 133  Diastolic 70 74 69  Pulse 81 84 76   Physical Exam  VS: noted--normal. Gen: alert, NAD, NONTOXIC APPEARING. HEENT: eyes without injection, drainage, or  swelling.  Ears: Right ear with cerumen impaction about 80%.  Visualized portion of TM normal.  Minimal irritation noted in external auditory canal near the impaction.  No exudate.  Right external ear without erythema or swelling.  Some discomfort to palpation around the right ear and under right mandible and in proximal anterolateral right side of neck.  I feel no palpable adenopathy.  No erythema. Submandibular gland feels normal consistency and size, although tender to palpation. Nose: Minimal clear rhinorrhea with some dried, crusty exudate adherent to mildly injected mucosa.  No purulent d/c.  No paranasal sinus TTP.  No facial swelling.  Throat and mouth without focal lesion.  No pharyngial swelling, erythema, or exudate.   Neck: supple, no LAD.   LUNGS: CTA bilat, nonlabored resps.   CV: RRR, no m/r/g. EXT: no c/c/e SKIN: no rash   LABS:  Last CBC Lab Results  Component Value Date   WBC 9.0 02/19/2022   HGB 13.6 02/19/2022   HCT 39.0 02/19/2022   MCV 85.3 02/19/2022   MCH 29.8 02/19/2022   RDW 12.8 02/19/2022   PLT 231 02/19/2022   Last metabolic panel Lab Results  Component Value Date   GLUCOSE 100 (H) 02/19/2022   NA 138 02/19/2022   K 3.8 02/19/2022   CL 103 02/19/2022   CO2 29 02/19/2022   BUN 11 02/19/2022   CREATININE 0.95 02/19/2022   GFRNONAA >60 02/19/2022   CALCIUM 9.4 02/19/2022   PROT 7.6 09/30/2021   ALBUMIN 4.6 09/30/2021   BILITOT 0.8 09/30/2021   ALKPHOS 66 09/30/2021   AST 15 09/30/2021   ALT 24 09/30/2021   ANIONGAP 6 02/19/2022   IMPRESSION AND PLAN:  URI/sinusitis, with possible early cervical lymphadenitis. Discussed possibility of her pain being eustachian tube dysfunction as well as possible submandibular gland obstruction or inflammation.  Rapid strep, flu, and covid NEG today.  We will treat with Keflex 500 mg 3 times daily x7 days. Encouraged hard sour candy to induce maximum salivation.  An After Visit Summary was printed and given  to the patient.  FOLLOW UP: Return if symptoms worsen or fail to improve.  Signed:  02/21/2022, MD           05/12/2022

## 2022-07-28 ENCOUNTER — Other Ambulatory Visit: Payer: Self-pay | Admitting: Family Medicine

## 2022-07-29 ENCOUNTER — Ambulatory Visit: Payer: Self-pay | Admitting: Family Medicine

## 2022-08-01 ENCOUNTER — Ambulatory Visit (INDEPENDENT_AMBULATORY_CARE_PROVIDER_SITE_OTHER): Payer: Self-pay | Admitting: Family Medicine

## 2022-08-01 ENCOUNTER — Encounter: Payer: Self-pay | Admitting: Family Medicine

## 2022-08-01 VITALS — BP 114/72 | HR 96 | Temp 98.3°F | Ht 65.0 in | Wt 197.0 lb

## 2022-08-01 DIAGNOSIS — G8929 Other chronic pain: Secondary | ICD-10-CM

## 2022-08-01 DIAGNOSIS — R413 Other amnesia: Secondary | ICD-10-CM

## 2022-08-01 DIAGNOSIS — K219 Gastro-esophageal reflux disease without esophagitis: Secondary | ICD-10-CM

## 2022-08-01 DIAGNOSIS — E559 Vitamin D deficiency, unspecified: Secondary | ICD-10-CM

## 2022-08-01 DIAGNOSIS — N951 Menopausal and female climacteric states: Secondary | ICD-10-CM

## 2022-08-01 DIAGNOSIS — M25519 Pain in unspecified shoulder: Secondary | ICD-10-CM

## 2022-08-01 DIAGNOSIS — F419 Anxiety disorder, unspecified: Secondary | ICD-10-CM

## 2022-08-01 DIAGNOSIS — R5383 Other fatigue: Secondary | ICD-10-CM

## 2022-08-01 DIAGNOSIS — E538 Deficiency of other specified B group vitamins: Secondary | ICD-10-CM

## 2022-08-01 DIAGNOSIS — R251 Tremor, unspecified: Secondary | ICD-10-CM

## 2022-08-01 LAB — COMPREHENSIVE METABOLIC PANEL
ALT: 34 U/L (ref 0–35)
AST: 28 U/L (ref 0–37)
Albumin: 4.6 g/dL (ref 3.5–5.2)
Alkaline Phosphatase: 69 U/L (ref 39–117)
BUN: 12 mg/dL (ref 6–23)
CO2: 31 mEq/L (ref 19–32)
Calcium: 10.2 mg/dL (ref 8.4–10.5)
Chloride: 102 mEq/L (ref 96–112)
Creatinine, Ser: 0.96 mg/dL (ref 0.40–1.20)
GFR: 67.37 mL/min (ref >60.00)
Glucose, Bld: 99 mg/dL (ref 70–99)
Potassium: 4.3 mEq/L (ref 3.5–5.1)
Sodium: 140 mEq/L (ref 135–145)
Total Bilirubin: 0.6 mg/dL (ref 0.2–1.2)
Total Protein: 7.4 g/dL (ref 6.0–8.3)

## 2022-08-01 LAB — HEMOGLOBIN A1C: Hgb A1c MFr Bld: 5.2 % (ref 4.6–6.5)

## 2022-08-01 LAB — IBC + FERRITIN
Ferritin: 84.4 ng/mL (ref 10.0–291.0)
Iron: 129 ug/dL (ref 42–145)
Saturation Ratios: 32.2 % (ref 20.0–50.0)
TIBC: 400.4 ug/dL (ref 250.0–450.0)
Transferrin: 286 mg/dL (ref 212.0–360.0)

## 2022-08-01 LAB — B12 AND FOLATE PANEL
Folate: 8.4 ng/mL (ref >5.9)
Vitamin B-12: 372 pg/mL (ref 211–911)

## 2022-08-01 LAB — TSH: TSH: 1.66 u[IU]/mL (ref 0.35–5.50)

## 2022-08-01 LAB — MAGNESIUM: Magnesium: 2.1 mg/dL (ref 1.5–2.5)

## 2022-08-01 LAB — VITAMIN D 25 HYDROXY (VIT D DEFICIENCY, FRACTURES): VITD: 24.67 ng/mL — ABNORMAL LOW (ref 30.00–100.00)

## 2022-08-01 MED ORDER — BUSPIRONE HCL 10 MG PO TABS: 10.0000 mg | ORAL_TABLET | Freq: Two times a day (BID) | ORAL | 5 refills | Status: DC

## 2022-08-01 MED ORDER — VENLAFAXINE HCL ER 150 MG PO CP24: 150.0000 mg | ORAL_CAPSULE | Freq: Every day | ORAL | 1 refills | Status: DC

## 2022-08-01 NOTE — Progress Notes (Signed)
Patient Care Team    Relationship Specialty Notifications Start End  [name], DO PCP - General Family Medicine  05/01/15      SUBJECTIVE Chief Complaint  Patient presents with   Anxiety    Pt c/o lack of focus and inc anxiety x 6 mos; believes sx started when she was in 4th grade   Medication Problem    Pt experience shakes when taking medications in morning    HPI: Brenda Ray is a 51 y.o. female present for [facility] follow up.  Anxiety/hot flashes/fatigue: Patient reports she believes the Effexor 150 mg is working well for her.  She does take the BuSpar twice daily.  She states she does feel shaky in the morning before she takes her medicines.  When she takes her medicine she feels her anxiety is well-controlled.  She does complain of having difficulty with brain fog and focus now.  She states her long-term memory seems to be declining.  She reports compliance with her B12 and vitamin D. Patient reports her anxiety is starting to increase a small amount.  She states initially she thought the Effexor 75 mg daily and the BuSpar twice daily was working great, but now she started to get that anxious shaky feeling again and thinks maybe she should go up on the Effexor.  She thinks the vitamin supplements have helped with the fatigue.  She also is using the Atarax on occasions and feels it is working well for her. Tried: paxil (sleepy), ativan (sleepy at low doses)  Shoulder pain: She is she has continued to use the meloxicam with flares of her shoulder pain.  GERD:she is compliant with protonix 40 mg qd.  Needs refills today.   Allergies: Patient is compliant with Zyrtec nightly and Flonase nasal spray daily, this combination seems to work well for her.  Review of Systems  All other systems reviewed and are negative.      08/01/2022   10:05 AM 02/06/2022   12:11 PM 09/30/2021    8:23 AM 07/10/2021    3:42 PM 11/02/2020   10:59 AM  Depression screen PHQ 2/9  Decreased  Interest 1 0 1 0 0  Down, Depressed, Hopeless 0 1 0 0 1  PHQ - 2 Score 1 1 1  0 1  Altered sleeping 2 1 2   0  Tired, decreased energy 2 0 3  0  Change in appetite 0 1 0  0  Feeling bad or failure about yourself  0 0 0  0  Trouble concentrating 3 0 0  0  Moving slowly or fidgety/restless 2 0 0  0  Suicidal thoughts 0 0 0  0  PHQ-9 Score 10 3 6  1       08/01/2022   10:05 AM 02/06/2022   12:12 PM 09/30/2021    8:24 AM 11/02/2020   11:00 AM  GAD 7 : Generalized Anxiety Score  Nervous, Anxious, on Edge 1 1 1 1   Control/stop worrying 0 0 0 0  Worry too much - different things 1 0 1 1  Trouble relaxing 0 1  0  Restless 1 0 0 0  Easily annoyed or irritable 1 0 0 0  Afraid - awful might happen 0 0 0 0  Total GAD 7 Score 4 2  2     Patient Active Problem List   Diagnosis Date Noted   Memory changes 08/08/2022   Insulin resistance 10/08/2021   Obesity with serious comorbidity 10/08/2021  Allergic rhinitis 08/29/2021   GERD without esophagitis 08/22/2020   Chronic shoulder pain 08/02/2018   Benzodiazepine contract exists 10/05/2017   Anxiety 10/02/2017   Hot flashes due to menopause 10/02/2017   Stress incontinence 10/01/2015    Social History   Tobacco Use   Smoking status: Former    Packs/day: 0.50    Years: 36.00    Total pack years: 18.00    Types: Cigarettes, E-cigarettes    Quit date: 02/25/2017    Years since quitting: 5.4   Smokeless tobacco: Former  Substance Use Topics   Alcohol use: Not Currently    Current Outpatient Medications:    cetirizine (ZYRTEC) 10 MG tablet, Take 1 tablet (10 mg total) by mouth at bedtime., Disp: 90 tablet, Rfl: 3   Cholecalciferol (VITAMIN D-3) 25 MCG (1000 UT) CAPS, Take by mouth., Disp: , Rfl:    fluticasone (FLONASE) 50 MCG/ACT nasal spray, Place 2 sprays into both nostrils daily., Disp: 16 g, Rfl: 6   hydrOXYzine (ATARAX) 10 MG tablet, 1/2 tab during the day and 2 tabs before bed. Can take every 8 hours as needed., Disp: 90 tablet, Rfl:  1   meloxicam (MOBIC) 15 MG tablet, Take 1 tablet (15 mg total) by mouth daily., Disp: 90 tablet, Rfl: 1   pantoprazole (PROTONIX) 40 MG tablet, Take 1 tablet (40 mg total) by mouth daily., Disp: 90 tablet, Rfl: 1   amphetamine-dextroamphetamine (ADDERALL) 10 MG tablet, Take 0.5 tablets (5 mg total) by mouth 2 (two) times daily., Disp: 30 tablet, Rfl: 0   busPIRone (BUSPAR) 10 MG tablet, Take 1 tablet (10 mg total) by mouth 2 (two) times daily., Disp: 60 tablet, Rfl: 5   venlafaxine XR (EFFEXOR XR) 150 MG 24 hr capsule, Take 1 capsule (150 mg total) by mouth daily with breakfast., Disp: 90 capsule, Rfl: 1  Allergies  Allergen Reactions   Augmentin [Amoxicillin-Pot Clavulanate] Diarrhea   Doxycycline Nausea And Vomiting   Hydrocodone Itching    OBJECTIVE: BP 114/72   Pulse 96   Temp 98.3 F (36.8 C) (Oral)   Ht 5\' 5"  (1.651 m)   Wt 197 lb (89.4 kg)   SpO2 97%   BMI 32.78 kg/m  Physical Exam Vitals and nursing note reviewed.  Constitutional:      General: She is not in acute distress.    Appearance: Normal appearance. She is not ill-appearing, toxic-appearing or diaphoretic.  HENT:     Head: Normocephalic and atraumatic.  Eyes:     General: No scleral icterus.       Right eye: No discharge.        Left eye: No discharge.     Extraocular Movements: Extraocular movements intact.     Conjunctiva/sclera: Conjunctivae normal.     Pupils: Pupils are equal, round, and reactive to light.  Cardiovascular:     Rate and Rhythm: Normal rate and regular rhythm.  Pulmonary:     Effort: Pulmonary effort is normal. No respiratory distress.     Breath sounds: Normal breath sounds. No wheezing, rhonchi or rales.  Musculoskeletal:     Cervical back: Neck supple. No tenderness.     Right lower leg: No edema.     Left lower leg: No edema.  Lymphadenopathy:     Cervical: No cervical adenopathy.  Skin:    General: Skin is warm and dry.     Coloration: Skin is not jaundiced or pale.      Findings: No erythema or rash.  Neurological:  Mental Status: She is alert and oriented to person, place, and time. Mental status is at baseline.     Motor: No weakness.     Gait: Gait normal.  Psychiatric:        Mood and Affect: Mood normal.        Behavior: Behavior normal.        Thought Content: Thought content normal.        Judgment: Judgment normal.      ASSESSMENT AND PLAN: Brenda Ray is a 53 y.o. female present for  Chronic pain of left shoulder Stable continue Mobic 15 mg daily.  GERD without esophagitis Stable continue Protonix   Non-seasonal allergic rhinitis due to pollen Continue Flonase  Continue zyrtec  B12 deficiency/Vitamin D deficiency Vitamin D and B12 levels collected today.  Anxiety/Hot flashes due to menopauseBenzodiazepine contract exists/panic Stable. Continue Effexor 150 mg daily Continue BuSpar twice daily Tried: celexa. Follow-up 5.5 months  Memory changes We will collect labs today and see if we can identify cause for her changes in memory and lack of focus.  She is already on Effexor which should have helped with some focus.  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 Will await lab results and discuss further plan at that time - Magnesium - TSH - Thyroid peroxidase antibody - Comp Met (CMET) Tremulousness - Magnesium - Comp Met (CMET) - Hemoglobin A1c fatigue - IBC + Ferritin  No follow-ups on file.   Orders Placed This Encounter  Procedures   Vitamin D (25 hydroxy)   B12 and Folate Panel   IBC + Ferritin   Magnesium   TSH   Thyroid peroxidase antibody   Comp Met (CMET)   Hemoglobin A1c   Meds ordered this encounter  Medications   busPIRone (BUSPAR) 10 MG tablet    Sig: Take 1 tablet (10 mg total) by mouth 2 (two) times daily.    Dispense:  60 tablet    Refill:  5    venlafaxine XR (EFFEXOR XR) 150 MG 24 hr capsule    Sig: Take 1 capsule (150 mg total) by mouth daily with breakfast.    Dispense:  90 capsule    Refill:  1   Referral Orders  No referral(s) requested today      Howard Pouch, DO 08/08/2022

## 2022-08-04 LAB — THYROID PEROXIDASE ANTIBODY: Thyroperoxidase Ab SerPl-aCnc: 21 IU/mL — ABNORMAL HIGH (ref <9)

## 2022-08-05 ENCOUNTER — Telehealth: Payer: Self-pay | Admitting: Family Medicine

## 2022-08-05 MED ORDER — AMPHETAMINE-DEXTROAMPHETAMINE 10 MG PO TABS: 5.0000 mg | ORAL_TABLET | Freq: Two times a day (BID) | ORAL | 0 refills | Status: DC

## 2022-08-05 NOTE — Telephone Encounter (Signed)
Pt advised of results. She started taking 10,000mg  of vit d yesterday and plans to continue this for the next 4-5 days, then stop for a few days and go back to taking another 4-5 days. She wanted to confirm if this would be ok.  Note: please send response to mychart, patient requested.

## 2022-08-05 NOTE — Telephone Encounter (Signed)
Ok to do that for the short term, but not long term.

## 2022-08-05 NOTE — Telephone Encounter (Signed)
Unable to LVM.

## 2022-08-05 NOTE — Telephone Encounter (Signed)
Please call patient: Her liver and kidney function is normal. Iron levels are normal. Folate levels are normal. Vitamin D and B12 are both in the mildly low range. Her sugar is normal. Thyroid level was normal but she does have a very mildly elevated antibody.    Decided to try her on a very low-dose of a stimulant for her focus. Adderall half tab with breakfast in the morning and half tab with lunch.  Would not recommend taking after 1 PM.  Follow-up in 2-3 weeks.  Since a stimulant is a controlled substance, she would need to be seen face-to-face in the office for this visit .  If it worked well for her and did not cause additional anxiety, we can consider other long-term prescription.  Effexor and BuSpar the same doses-unchanged Increase vitamin D supplement by 1000 units daily. Start B12 1000 mcg sublingual supplement daily.  OTC.

## 2022-08-06 NOTE — Telephone Encounter (Signed)
Pt informed

## 2022-08-06 NOTE — Telephone Encounter (Signed)
LVM for pt to CB regarding results.  

## 2022-08-08 DIAGNOSIS — R413 Other amnesia: Secondary | ICD-10-CM | POA: Insufficient documentation

## 2022-08-21 ENCOUNTER — Telehealth: Payer: Self-pay

## 2022-08-21 DIAGNOSIS — Z1231 Encounter for screening mammogram for malignant neoplasm of breast: Secondary | ICD-10-CM

## 2022-08-21 NOTE — Telephone Encounter (Signed)
Patient was trying to make appt with breast center.  She was told there was no order to get  Bilateral diagnostic breast exam.  There is an order that has not expired but does not say specifically "diagnostic". Patient aware Dr. Raoul Pitch leaving office today at 12noon because this is her half day. May not get addressed until tomorrow.  L[place]awn can be reached at [phone].

## 2022-08-22 NOTE — Addendum Note (Signed)
Addended by: Beatrix Fetters on: 08/22/2022 04:32 PM   Modules accepted: Orders

## 2022-08-22 NOTE — Telephone Encounter (Signed)
I am uncertain if I understand the full story.  If she is not having any new issues but they are requesting a diagnostic mammogram because she had a diagnostic mammogram 12 months ago, then we can change the order for them.

## 2022-08-28 ENCOUNTER — Other Ambulatory Visit: Payer: Self-pay | Admitting: Family Medicine

## 2022-08-28 DIAGNOSIS — N6310 Unspecified lump in the right breast, unspecified quadrant: Secondary | ICD-10-CM

## 2022-09-04 ENCOUNTER — Ambulatory Visit
Admission: RE | Admit: 2022-09-04 | Discharge: 2022-09-04 | Disposition: A | Discharge status: Home / Self Care | Payer: 59 | Source: Ambulatory Visit | Attending: Family Medicine | Admitting: Family Medicine

## 2022-09-04 ENCOUNTER — Ambulatory Visit
Admission: RE | Admit: 2022-09-04 | Discharge: 2022-09-04 | Disposition: A | Discharge status: Home / Self Care | Payer: Self-pay | Source: Ambulatory Visit | Attending: Family Medicine | Admitting: Family Medicine

## 2022-09-04 DIAGNOSIS — N6310 Unspecified lump in the right breast, unspecified quadrant: Secondary | ICD-10-CM

## 2022-09-04 DIAGNOSIS — N6001 Solitary cyst of right breast: Secondary | ICD-10-CM | POA: Diagnosis not present

## 2022-09-04 DIAGNOSIS — R922 Inconclusive mammogram: Secondary | ICD-10-CM | POA: Diagnosis not present

## 2022-09-05 ENCOUNTER — Encounter: Payer: Self-pay | Admitting: Family Medicine

## 2022-09-05 ENCOUNTER — Ambulatory Visit (INDEPENDENT_AMBULATORY_CARE_PROVIDER_SITE_OTHER): Payer: 59 | Admitting: Family Medicine

## 2022-09-05 VITALS — BP 135/85 | HR 67 | Temp 98.0°F | Wt 195.6 lb

## 2022-09-05 DIAGNOSIS — R69 Illness, unspecified: Secondary | ICD-10-CM | POA: Diagnosis not present

## 2022-09-05 DIAGNOSIS — R41840 Attention and concentration deficit: Secondary | ICD-10-CM | POA: Diagnosis not present

## 2022-09-05 DIAGNOSIS — F419 Anxiety disorder, unspecified: Secondary | ICD-10-CM | POA: Diagnosis not present

## 2022-09-05 DIAGNOSIS — R413 Other amnesia: Secondary | ICD-10-CM

## 2022-09-05 MED ORDER — AMPHETAMINE-DEXTROAMPHETAMINE 10 MG PO TABS: 5.0000 mg | ORAL_TABLET | Freq: Two times a day (BID) | ORAL | 0 refills | Status: DC

## 2022-09-05 NOTE — Progress Notes (Signed)
Patient Care Team    Relationship Specialty Notifications Start End  [name], DO PCP - General Family Medicine  05/01/15      SUBJECTIVE Chief Complaint  Patient presents with   ADD   HPI: Brenda Ray is a 52 y.o. female present for [facility] follow up after start of new medication  Anxiety/hot flashes/fatigue/attention: Patient reports she believes the Effexor 150 mg is working well for her.  She does take the BuSpar twice daily.   She was started on adderall 5 mg BID last visit and feels it is working well.  Prior note: She states she does feel shaky in the morning before she takes her medicines.  When she takes her medicine she feels her anxiety is well-controlled.  She does complain of having difficulty with brain fog and focus now.  She states her long-term memory seems to be declining.  She reports compliance with her B12 and vitamin D. Patient reports her anxiety is starting to increase a small amount.  She states initially she thought the Effexor 75 mg daily and the BuSpar twice daily was working great, but now she started to get that anxious shaky feeling again and thinks maybe she should go up on the Effexor.  She thinks the vitamin supplements have helped with the fatigue.  She also is using the Atarax on occasions and feels it is working well for her. Tried: paxil (sleepy), ativan (sleepy at low doses)  Documentation only: Shoulder pain: She is she has continued to use the meloxicam with flares of her shoulder pain.  GERD:she is compliant with protonix 40 mg qd.  Needs refills today.   Allergies: Patient is compliant with Zyrtec nightly and Flonase nasal spray daily, this combination seems to work well for her.  Review of Systems  All other systems reviewed and are negative.      08/01/2022   10:05 AM 02/06/2022   12:11 PM 09/30/2021    8:23 AM 07/10/2021    3:42 PM 11/02/2020   10:59 AM  Depression screen PHQ 2/9  Decreased Interest 1 0 1 0 0  Down,  Depressed, Hopeless 0 1 0 0 1  PHQ - 2 Score 1 1 1 $ 0 1  Altered sleeping 2 1 2  $ 0  Tired, decreased energy 2 0 3  0  Change in appetite 0 1 0  0  Feeling bad or failure about yourself  0 0 0  0  Trouble concentrating 3 0 0  0  Moving slowly or fidgety/restless 2 0 0  0  Suicidal thoughts 0 0 0  0  PHQ-9 Score 10 3 6  1      $ 08/01/2022   10:05 AM 02/06/2022   12:12 PM 09/30/2021    8:24 AM 11/02/2020   11:00 AM  GAD 7 : Generalized Anxiety Score  Nervous, Anxious, on Edge 1 1 1 1  $ Control/stop worrying 0 0 0 0  Worry too much - different things 1 0 1 1  Trouble relaxing 0 1  0  Restless 1 0 0 0  Easily annoyed or irritable 1 0 0 0  Afraid - awful might happen 0 0 0 0  Total GAD 7 Score 4 2  2    $ Patient Active Problem List   Diagnosis Date Noted   Attention and concentration deficit 09/05/2022   Memory changes 08/08/2022   Insulin resistance 10/08/2021   Obesity with serious comorbidity 10/08/2021   Allergic rhinitis 08/29/2021  GERD without esophagitis 08/22/2020   Chronic shoulder pain 08/02/2018   Benzodiazepine contract exists 10/05/2017   Anxiety 10/02/2017   Hot flashes due to menopause 10/02/2017   Stress incontinence 10/01/2015    Social History   Tobacco Use   Smoking status: Former    Packs/day: 0.50    Years: 36.00    Total pack years: 18.00    Types: Cigarettes, E-cigarettes    Quit date: 02/25/2017    Years since quitting: 5.5   Smokeless tobacco: Former  Substance Use Topics   Alcohol use: Not Currently    Current Outpatient Medications:    busPIRone (BUSPAR) 10 MG tablet, Take 1 tablet (10 mg total) by mouth 2 (two) times daily., Disp: 60 tablet, Rfl: 5   cetirizine (ZYRTEC) 10 MG tablet, Take 1 tablet (10 mg total) by mouth at bedtime., Disp: 90 tablet, Rfl: 3   Cholecalciferol (VITAMIN D-3) 25 MCG (1000 UT) CAPS, Take by mouth., Disp: , Rfl:    fluticasone (FLONASE) 50 MCG/ACT nasal spray, Place 2 sprays into both nostrils daily., Disp: 16 g, Rfl:  6   hydrOXYzine (ATARAX) 10 MG tablet, 1/2 tab during the day and 2 tabs before bed. Can take every 8 hours as needed., Disp: 90 tablet, Rfl: 1   meloxicam (MOBIC) 15 MG tablet, Take 1 tablet (15 mg total) by mouth daily., Disp: 90 tablet, Rfl: 1   pantoprazole (PROTONIX) 40 MG tablet, Take 1 tablet (40 mg total) by mouth daily., Disp: 90 tablet, Rfl: 1   venlafaxine XR (EFFEXOR XR) 150 MG 24 hr capsule, Take 1 capsule (150 mg total) by mouth daily with breakfast., Disp: 90 capsule, Rfl: 1   amphetamine-dextroamphetamine (ADDERALL) 10 MG tablet, Take 0.5 tablets (5 mg total) by mouth 2 (two) times daily., Disp: 30 tablet, Rfl: 0   [START ON 10/27/2022] amphetamine-dextroamphetamine (ADDERALL) 10 MG tablet, Take 0.5 tablets (5 mg total) by mouth 2 (two) times daily., Disp: 30 tablet, Rfl: 0   [START ON 09/29/2022] amphetamine-dextroamphetamine (ADDERALL) 10 MG tablet, Take 0.5 tablets (5 mg total) by mouth 2 (two) times daily., Disp: 30 tablet, Rfl: 0  Allergies  Allergen Reactions   Augmentin [Amoxicillin-Pot Clavulanate] Diarrhea   Doxycycline Nausea And Vomiting   Hydrocodone Itching    OBJECTIVE: BP 135/85   Pulse 67   Temp 98 F (36.7 C)   Wt 195 lb 9.6 oz (88.7 kg)   LMP 04/17/2019 (Exact Date)   SpO2 99%   BMI 32.55 kg/m  Physical Exam Vitals and nursing note reviewed.  Constitutional:      General: She is not in acute distress.    Appearance: Normal appearance. She is normal weight. She is not ill-appearing or toxic-appearing.  HENT:     Head: Normocephalic and atraumatic.  Eyes:     General: No scleral icterus.       Right eye: No discharge.        Left eye: No discharge.     Extraocular Movements: Extraocular movements intact.     Conjunctiva/sclera: Conjunctivae normal.     Pupils: Pupils are equal, round, and reactive to light.  Skin:    Findings: No rash.  Neurological:     Mental Status: She is alert and oriented to person, place, and time. Mental status is at  baseline.     Motor: No weakness.     Coordination: Coordination normal.     Gait: Gait normal.  Psychiatric:        Mood and Affect:  Mood normal.        Behavior: Behavior normal.        Thought Content: Thought content normal.        Judgment: Judgment normal.     ASSESSMENT AND PLAN: Brenda Ray is a 52 y.o. female present for  Memory changes/fatigue/attention deficit She reports she has seen a great positive change since starting the low-dose Adderall 5 mg twice daily.  She would like to stay on this dose. She is feeling more motivated and getting her work done.  She is able to focus and complete tasks on time now.  Documentation only: Chronic pain of left shoulder Stable continue Mobic 15 mg daily.  GERD without esophagitis Stable continue Protonix   Non-seasonal allergic rhinitis due to pollen Continue Flonase  Continue zyrtec  B12 deficiency/Vitamin D deficiency Vitamin D and B12 levels collected today.  Anxiety/Hot flashes due to menopauseBenzodiazepine contract exists/panic Stable. Continue Effexor 150 mg daily Continue BuSpar twice daily Tried: celexa. Follow-up 5.5 months    Return in about 11 weeks (around 11/21/2022).   No orders of the defined types were placed in this encounter.  Meds ordered this encounter  Medications   amphetamine-dextroamphetamine (ADDERALL) 10 MG tablet    Sig: Take 0.5 tablets (5 mg total) by mouth 2 (two) times daily.    Dispense:  30 tablet    Refill:  0   amphetamine-dextroamphetamine (ADDERALL) 10 MG tablet    Sig: Take 0.5 tablets (5 mg total) by mouth 2 (two) times daily.    Dispense:  30 tablet    Refill:  0   amphetamine-dextroamphetamine (ADDERALL) 10 MG tablet    Sig: Take 0.5 tablets (5 mg total) by mouth 2 (two) times daily.    Dispense:  30 tablet    Refill:  0   Referral Orders  No referral(s) requested today      Howard Pouch, DO 09/05/2022

## 2022-09-05 NOTE — Patient Instructions (Signed)
Return in about 11 weeks (around 11/21/2022).        Great to see you today.  I have refilled the medication(s) we provide.   If labs were collected, we will inform you of lab results once received either by echart message or telephone call.   - echart message- for normal results that have been seen by the patient already.   - telephone call: abnormal results or if patient has not viewed results in their echart.

## 2022-09-12 DIAGNOSIS — M25571 Pain in right ankle and joints of right foot: Secondary | ICD-10-CM | POA: Diagnosis not present

## 2022-09-16 ENCOUNTER — Encounter: Payer: Self-pay | Admitting: Family Medicine

## 2022-09-18 ENCOUNTER — Encounter: Payer: Self-pay | Admitting: Family Medicine

## 2022-09-18 ENCOUNTER — Ambulatory Visit: Payer: 59 | Admitting: Family Medicine

## 2022-09-18 VITALS — BP 137/81 | HR 91 | Temp 97.6°F | Wt 195.8 lb

## 2022-09-18 DIAGNOSIS — R41840 Attention and concentration deficit: Secondary | ICD-10-CM

## 2022-09-18 MED ORDER — AMPHETAMINE-DEXTROAMPHETAMINE 20 MG PO TABS: 10.0000 mg | ORAL_TABLET | Freq: Two times a day (BID) | ORAL | 0 refills | Status: DC

## 2022-09-18 MED ORDER — AMPHETAMINE-DEXTROAMPHETAMINE 20 MG PO TABS
10.0000 mg | ORAL_TABLET | Freq: Two times a day (BID) | ORAL | 0 refills | Status: DC
Start: 2022-10-12 — End: 2022-11-28

## 2022-09-18 NOTE — Progress Notes (Signed)
Patient Care Team    Relationship Specialty Notifications Start End  [name], DO PCP - General Family Medicine  05/01/15      SUBJECTIVE Chief Complaint  Patient presents with   Anxiety   HPI: Brenda Ray is a 52 y.o. female present for [facility] follow up after start of new medication  Anxiety/hot flashes/fatigue/attention: Patient reports she believes the Effexor 150 mg is working well for her.  She does take the BuSpar twice daily.   She was started on adderall 5 mg BID last visit and feels it is working well, but has noticed is not working as well as when initially started and would like to increase dose today. Prior note: She states she does feel shaky in the morning before she takes her medicines.  When she takes her medicine she feels her anxiety is well-controlled.  She does complain of having difficulty with brain fog and focus now.  She states her long-term memory seems to be declining.  She reports compliance with her B12 and vitamin D. Patient reports her anxiety is starting to increase a small amount.  She states initially she thought the Effexor 75 mg daily and the BuSpar twice daily was working great, but now she started to get that anxious shaky feeling again and thinks maybe she should go up on the Effexor.  She thinks the vitamin supplements have helped with the fatigue.  She also is using the Atarax on occasions and feels it is working well for her. Tried: paxil (sleepy), ativan (sleepy at low doses)  Documentation only: Shoulder pain: She is she has continued to use the meloxicam with flares of her shoulder pain.  GERD:she is compliant with protonix 40 mg qd.  Needs refills today.   Allergies: Patient is compliant with Zyrtec nightly and Flonase nasal spray daily, this combination seems to work well for her.  Review of Systems  All other systems reviewed and are negative.      08/01/2022   10:05 AM 02/06/2022   12:11 PM 09/30/2021    8:23 AM  07/10/2021    3:42 PM 11/02/2020   10:59 AM  Depression screen PHQ 2/9  Decreased Interest 1 0 1 0 0  Down, Depressed, Hopeless 0 1 0 0 1  PHQ - 2 Score 1 1 1 $ 0 1  Altered sleeping 2 1 2  $ 0  Tired, decreased energy 2 0 3  0  Change in appetite 0 1 0  0  Feeling bad or failure about yourself  0 0 0  0  Trouble concentrating 3 0 0  0  Moving slowly or fidgety/restless 2 0 0  0  Suicidal thoughts 0 0 0  0  PHQ-9 Score 10 3 6  1      $ 08/01/2022   10:05 AM 02/06/2022   12:12 PM 09/30/2021    8:24 AM 11/02/2020   11:00 AM  GAD 7 : Generalized Anxiety Score  Nervous, Anxious, on Edge 1 1 1 1  $ Control/stop worrying 0 0 0 0  Worry too much - different things 1 0 1 1  Trouble relaxing 0 1  0  Restless 1 0 0 0  Easily annoyed or irritable 1 0 0 0  Afraid - awful might happen 0 0 0 0  Total GAD 7 Score 4 2  2    $ Patient Active Problem List   Diagnosis Date Noted   Attention and concentration deficit 09/05/2022   Memory changes 08/08/2022  Insulin resistance 10/08/2021   Obesity with serious comorbidity 10/08/2021   Allergic rhinitis 08/29/2021   GERD without esophagitis 08/22/2020   Chronic shoulder pain 08/02/2018   Benzodiazepine contract exists 10/05/2017   Anxiety 10/02/2017   Hot flashes due to menopause 10/02/2017   Stress incontinence 10/01/2015    Social History   Tobacco Use   Smoking status: Former    Packs/day: 0.50    Years: 36.00    Total pack years: 18.00    Types: Cigarettes, E-cigarettes    Quit date: 02/25/2017    Years since quitting: 5.5   Smokeless tobacco: Former  Substance Use Topics   Alcohol use: Not Currently    Current Outpatient Medications:    busPIRone (BUSPAR) 10 MG tablet, Take 1 tablet (10 mg total) by mouth 2 (two) times daily., Disp: 60 tablet, Rfl: 5   cetirizine (ZYRTEC) 10 MG tablet, Take 1 tablet (10 mg total) by mouth at bedtime., Disp: 90 tablet, Rfl: 3   Cholecalciferol (VITAMIN D-3) 25 MCG (1000 UT) CAPS, Take by mouth., Disp: ,  Rfl:    fluticasone (FLONASE) 50 MCG/ACT nasal spray, Place 2 sprays into both nostrils daily., Disp: 16 g, Rfl: 6   hydrOXYzine (ATARAX) 10 MG tablet, 1/2 tab during the day and 2 tabs before bed. Can take every 8 hours as needed., Disp: 90 tablet, Rfl: 1   meloxicam (MOBIC) 15 MG tablet, Take 1 tablet (15 mg total) by mouth daily., Disp: 90 tablet, Rfl: 1   pantoprazole (PROTONIX) 40 MG tablet, Take 1 tablet (40 mg total) by mouth daily., Disp: 90 tablet, Rfl: 1   venlafaxine XR (EFFEXOR XR) 150 MG 24 hr capsule, Take 1 capsule (150 mg total) by mouth daily with breakfast., Disp: 90 capsule, Rfl: 1   amphetamine-dextroamphetamine (ADDERALL) 20 MG tablet, Take 0.5 tablets (10 mg total) by mouth 2 (two) times daily with a meal., Disp: 30 tablet, Rfl: 0   [START ON 10/12/2022] amphetamine-dextroamphetamine (ADDERALL) 20 MG tablet, Take 0.5 tablets (10 mg total) by mouth 2 (two) times daily with a meal., Disp: 30 tablet, Rfl: 0   [START ON 11/07/2022] amphetamine-dextroamphetamine (ADDERALL) 20 MG tablet, Take 0.5 tablets (10 mg total) by mouth 2 (two) times daily., Disp: 30 tablet, Rfl: 0  Allergies  Allergen Reactions   Augmentin [Amoxicillin-Pot Clavulanate] Diarrhea   Doxycycline Nausea And Vomiting   Hydrocodone Itching    OBJECTIVE: BP 137/81   Pulse 91   Temp 97.6 F (36.4 C)   Wt 195 lb 12.8 oz (88.8 kg)   LMP 04/17/2019 (Exact Date)   SpO2 97%   BMI 32.58 kg/m  Physical Exam Vitals and nursing note reviewed.  Constitutional:      General: She is not in acute distress.    Appearance: Normal appearance. She is normal weight. She is not ill-appearing or toxic-appearing.  HENT:     Head: Normocephalic and atraumatic.  Eyes:     General: No scleral icterus.       Right eye: No discharge.        Left eye: No discharge.     Extraocular Movements: Extraocular movements intact.     Conjunctiva/sclera: Conjunctivae normal.     Pupils: Pupils are equal, round, and reactive to light.   Cardiovascular:     Rate and Rhythm: Normal rate and regular rhythm.  Pulmonary:     Effort: Pulmonary effort is normal.  Musculoskeletal:     Right lower leg: No edema.     Left  lower leg: No edema.  Skin:    Findings: No rash.  Neurological:     Mental Status: She is alert and oriented to person, place, and time. Mental status is at baseline.     Motor: No weakness.     Coordination: Coordination normal.     Gait: Gait normal.  Psychiatric:        Mood and Affect: Mood normal.        Behavior: Behavior normal.        Thought Content: Thought content normal.        Judgment: Judgment normal.     ASSESSMENT AND PLAN: KINLY DAUBE is a 52 y.o. female present for  Memory changes/fatigue/attention deficit She reports she has seen a great positive change since starting the low-dose Adderall 5 mg twice daily.  She would like to increase dose a small fraction. Increase to adderall 10 mg bid. Prior scripts canceled x3 scripts sent [facility] controlled substance database reviewed and appropriate 09/18/22  ___________________________________ Documentation only: Chronic pain of left shoulder Stable continue Mobic 15 mg daily.  GERD without esophagitis Stable continue Protonix   Non-seasonal allergic rhinitis due to pollen Continue Flonase  Continue zyrtec  B12 deficiency/Vitamin D deficiency Vitamin D and B12 levels collected today.  Anxiety/Hot flashes due to menopauseBenzodiazepine contract exists/panic Stable. Continue Effexor 150 mg daily Continue BuSpar twice daily Tried: celexa. Follow-up 5.5 months    Return in about 11 weeks (around 12/04/2022) for Routine chronic condition follow-up.   No orders of the defined types were placed in this encounter.  Meds ordered this encounter  Medications   amphetamine-dextroamphetamine (ADDERALL) 20 MG tablet    Sig: Take 0.5 tablets (10 mg total) by mouth 2 (two) times daily with a meal.    Dispense:  30 tablet     Refill:  0    1: 3 prescriptions.  Please discontinue  lower dose Adderall prescriptions, if any remaining   amphetamine-dextroamphetamine (ADDERALL) 20 MG tablet    Sig: Take 0.5 tablets (10 mg total) by mouth 2 (two) times daily with a meal.    Dispense:  30 tablet    Refill:  0    2: 3-please discontinue the lower dose prescriptions if any remaining   amphetamine-dextroamphetamine (ADDERALL) 20 MG tablet    Sig: Take 0.5 tablets (10 mg total) by mouth 2 (two) times daily.    Dispense:  30 tablet    Refill:  0    3: 3 prescriptions. please discontinue lower dose Adderall prescriptions, if any remaining   Referral Orders  No referral(s) requested today      Howard Pouch, DO 09/18/2022

## 2022-09-18 NOTE — Patient Instructions (Signed)
Return in about 11 weeks (around 12/04/2022) for Routine chronic condition follow-up.        Great to see you today.  I have refilled the medication(s) we provide.   If labs were collected, we will inform you of lab results once received either by echart message or telephone call.   - echart message- for normal results that have been seen by the patient already.   - telephone call: abnormal results or if patient has not viewed results in their echart.

## 2022-11-04 ENCOUNTER — Ambulatory Visit: Payer: Self-pay | Admitting: Family Medicine

## 2022-12-01 ENCOUNTER — Ambulatory Visit: Payer: BLUE CROSS/BLUE SHIELD | Admitting: Family Medicine

## 2022-12-01 ENCOUNTER — Encounter: Payer: Self-pay | Admitting: Family Medicine

## 2022-12-01 VITALS — BP 126/84 | HR 82 | Temp 98.1°F | Wt 190.4 lb

## 2022-12-01 DIAGNOSIS — R41840 Attention and concentration deficit: Secondary | ICD-10-CM | POA: Diagnosis not present

## 2022-12-01 DIAGNOSIS — K219 Gastro-esophageal reflux disease without esophagitis: Secondary | ICD-10-CM

## 2022-12-01 DIAGNOSIS — F419 Anxiety disorder, unspecified: Secondary | ICD-10-CM

## 2022-12-01 DIAGNOSIS — E559 Vitamin D deficiency, unspecified: Secondary | ICD-10-CM

## 2022-12-01 DIAGNOSIS — R413 Other amnesia: Secondary | ICD-10-CM | POA: Diagnosis not present

## 2022-12-01 DIAGNOSIS — N951 Menopausal and female climacteric states: Secondary | ICD-10-CM | POA: Diagnosis not present

## 2022-12-01 DIAGNOSIS — Z1211 Encounter for screening for malignant neoplasm of colon: Secondary | ICD-10-CM

## 2022-12-01 DIAGNOSIS — D519 Vitamin B12 deficiency anemia, unspecified: Secondary | ICD-10-CM

## 2022-12-01 MED ORDER — CETIRIZINE HCL 10 MG PO TABS
10.0000 mg | ORAL_TABLET | Freq: Every day | ORAL | 3 refills | Status: DC
Start: 2022-12-01 — End: 2023-05-19

## 2022-12-01 MED ORDER — PANTOPRAZOLE SODIUM 40 MG PO TBEC: 40.0000 mg | DELAYED_RELEASE_TABLET | Freq: Every day | ORAL | 1 refills | Status: AC

## 2022-12-01 MED ORDER — MELOXICAM 15 MG PO TABS: 15.0000 mg | ORAL_TABLET | Freq: Every day | ORAL | 1 refills | Status: DC

## 2022-12-01 MED ORDER — FLUTICASONE PROPIONATE 50 MCG/ACT NA SUSP: 2.0000 | Freq: Every day | NASAL | 6 refills | Status: DC

## 2022-12-01 MED ORDER — AMPHETAMINE-DEXTROAMPHETAMINE 20 MG PO TABS: 10.0000 mg | ORAL_TABLET | Freq: Two times a day (BID) | ORAL | 0 refills | Status: DC

## 2022-12-01 MED ORDER — VENLAFAXINE HCL ER 150 MG PO CP24: 150.0000 mg | ORAL_CAPSULE | Freq: Every day | ORAL | 1 refills | Status: DC

## 2022-12-01 NOTE — Patient Instructions (Signed)
No follow-ups on file.        Great to see you today.  I have refilled the medication(s) we provide.   If labs were collected, we will inform you of lab results once received either by echart message or telephone call.   - echart message- for normal results that have been seen by the patient already.   - telephone call: abnormal results or if patient has not viewed results in their echart.  

## 2022-12-01 NOTE — Progress Notes (Signed)
Patient Care Team    Relationship Specialty Notifications Start End  Brenda Leatherwood, DO PCP - General Family Medicine  05/01/15      SUBJECTIVE Chief Complaint  Patient presents with   Anxiety   HPI: Brenda Ray is a 52 y.o. female present for [facility] follow up after start of new medication  Anxiety/hot flashes/fatigue/attention: Patient reports she believes the Effexor 150 mg is working well for her.  She is no longer taking the BuSpar.  She feels the Adderall 10 mg twice daily is working well for her.  She sometimes takes 20 mg as a full tablet once daily.   Prior note: She states she does feel shaky in the morning before she takes her medicines.  When she takes her medicine she feels her anxiety is well-controlled.  She does complain of having difficulty with brain fog and focus now.  She states her long-term memory seems to be declining.  She reports compliance with her B12 and vitamin D. Patient reports her anxiety is starting to increase a small amount.  She states initially she thought the Effexor 75 mg daily and the BuSpar twice daily was working great, but now she started to get that anxious shaky feeling again and thinks maybe she should go up on the Effexor.  She thinks the vitamin supplements have helped with the fatigue.  She also is using the Atarax on occasions and feels it is working well for her. Tried: paxil (sleepy), ativan (sleepy at low doses)   Shoulder pain: She is she has continued to use the meloxicam with flares of her shoulder pain.  Meloxicam works well for her as needed  GERD:she is compliant with protonix 40 mg qd.   Allergies: Patient is compliant with Zyrtec nightly and Flonase nasal spray daily, this combination seems to work well for her.  Review of Systems  All other systems reviewed and are negative.      12/01/2022    8:36 AM 08/01/2022   10:05 AM 02/06/2022   12:11 PM 09/30/2021    8:23 AM 07/10/2021    3:42 PM  Depression screen PHQ 2/9   Decreased Interest 0 1 0 1 0  Down, Depressed, Hopeless 0 0 1 0 0  PHQ - 2 Score 0 1 1 1  0  Altered sleeping  2 1 2    Tired, decreased energy  2 0 3   Change in appetite  0 1 0   Feeling bad or failure about yourself   0 0 0   Trouble concentrating  3 0 0   Moving slowly or fidgety/restless  2 0 0   Suicidal thoughts  0 0 0   PHQ-9 Score  10 3 6        08/01/2022   10:05 AM 02/06/2022   12:12 PM 09/30/2021    8:24 AM 11/02/2020   11:00 AM  GAD 7 : Generalized Anxiety Score  Nervous, Anxious, on Edge 1 1 1 1   Control/stop worrying 0 0 0 0  Worry too much - different things 1 0 1 1  Trouble relaxing 0 1  0  Restless 1 0 0 0  Easily annoyed or irritable 1 0 0 0  Afraid - awful might happen 0 0 0 0  Total GAD 7 Score 4 2  2     Patient Active Problem List   Diagnosis Date Noted   Attention and concentration deficit 09/05/2022   Memory changes 08/08/2022   Insulin resistance 10/08/2021  Obesity with serious comorbidity 10/08/2021   Allergic rhinitis 08/29/2021   GERD without esophagitis 08/22/2020   Chronic shoulder pain 08/02/2018   Benzodiazepine contract exists 10/05/2017   Anxiety 10/02/2017   Hot flashes due to menopause 10/02/2017   Stress incontinence 10/01/2015    Social History   Tobacco Use   Smoking status: Former    Packs/day: 0.50    Years: 36.00    Additional pack years: 0.00    Total pack years: 18.00    Types: Cigarettes, E-cigarettes    Quit date: 02/25/2017    Years since quitting: 5.7   Smokeless tobacco: Former  Substance Use Topics   Alcohol use: Not Currently    Current Outpatient Medications:    Cholecalciferol (VITAMIN D-3) 25 MCG (1000 UT) CAPS, Take by mouth., Disp: , Rfl:    amphetamine-dextroamphetamine (ADDERALL) 20 MG tablet, Take 0.5 tablets (10 mg total) by mouth 2 (two) times daily with a meal., Disp: 30 tablet, Rfl: 0   amphetamine-dextroamphetamine (ADDERALL) 20 MG tablet, Take 0.5 tablets (10 mg total) by mouth 2 (two) times daily  with a meal., Disp: 30 tablet, Rfl: 0   amphetamine-dextroamphetamine (ADDERALL) 20 MG tablet, Take 0.5 tablets (10 mg total) by mouth 2 (two) times daily., Disp: 30 tablet, Rfl: 0   cetirizine (ZYRTEC) 10 MG tablet, Take 1 tablet (10 mg total) by mouth at bedtime., Disp: 90 tablet, Rfl: 3   fluticasone (FLONASE) 50 MCG/ACT nasal spray, Place 2 sprays into both nostrils daily., Disp: 16 g, Rfl: 6   meloxicam (MOBIC) 15 MG tablet, Take 1 tablet (15 mg total) by mouth daily., Disp: 90 tablet, Rfl: 1   pantoprazole (PROTONIX) 40 MG tablet, Take 1 tablet (40 mg total) by mouth daily., Disp: 90 tablet, Rfl: 1   venlafaxine XR (EFFEXOR XR) 150 MG 24 hr capsule, Take 1 capsule (150 mg total) by mouth daily with breakfast., Disp: 90 capsule, Rfl: 1  Allergies  Allergen Reactions   Augmentin [Amoxicillin-Pot Clavulanate] Diarrhea   Doxycycline Nausea And Vomiting   Hydrocodone Itching    OBJECTIVE: BP 126/84   Pulse 82   Temp 98.1 F (36.7 C)   Wt 190 lb 6.4 oz (86.4 kg)   LMP 04/17/2019 (Exact Date)   SpO2 96%   BMI 31.68 kg/m  Physical Exam Vitals and nursing note reviewed.  Constitutional:      General: She is not in acute distress.    Appearance: Normal appearance. She is normal weight. She is not ill-appearing or toxic-appearing.  HENT:     Head: Normocephalic and atraumatic.  Eyes:     General: No scleral icterus.       Right eye: No discharge.        Left eye: No discharge.     Extraocular Movements: Extraocular movements intact.     Conjunctiva/sclera: Conjunctivae normal.     Pupils: Pupils are equal, round, and reactive to light.  Cardiovascular:     Rate and Rhythm: Normal rate and regular rhythm.     Heart sounds: No murmur heard. Pulmonary:     Effort: Pulmonary effort is normal. No respiratory distress.     Breath sounds: No wheezing, rhonchi or rales.  Musculoskeletal:     Right lower leg: No edema.     Left lower leg: No edema.  Skin:    Findings: No rash.   Neurological:     Mental Status: She is alert and oriented to person, place, and time. Mental status is at baseline.  Motor: No weakness.     Coordination: Coordination normal.     Gait: Gait normal.  Psychiatric:        Mood and Affect: Mood normal.        Behavior: Behavior normal.        Thought Content: Thought content normal.        Judgment: Judgment normal.     ASSESSMENT AND PLAN: Brenda Ray is a 52 y.o. female present for  Memory changes/fatigue/attention deficit Stable Continue Adderall 20 mg daily x3 [place] controlled substance database reviewed and appropriate 12/01/22  Chronic pain of left shoulder Stable Continue Mobic 15 mg daily.  GERD without esophagitis Stable Continue Protonix   Non-seasonal allergic rhinitis due to pollen Continue Flonase Continue zyrtec  B12 deficiency/Vitamin D deficiency Vitamin D and B12 levels UTD  Anxiety/Hot flashes due to menopauseBenzodiazepine contract exists/panic Stable Continue Effexor 150 mg daily Tried: celexa.  BuSpar Follow-up 5.5 months  Colon cancer screen: Cologuard ordered today and pt encouraged to scheduled CPE/CMC combo  for next appt in 5.5 mos.  Return in about 24 weeks (around 05/18/2023) for cpe (20 min), Routine chronic condition follow-up.   Orders Placed This Encounter  Procedures   Cologuard   Meds ordered this encounter  Medications   amphetamine-dextroamphetamine (ADDERALL) 20 MG tablet    Sig: Take 0.5 tablets (10 mg total) by mouth 2 (two) times daily with a meal.    Dispense:  30 tablet    Refill:  0    1: 3 prescriptions.  Please discontinue  lower dose Adderall prescriptions, if any remaining   amphetamine-dextroamphetamine (ADDERALL) 20 MG tablet    Sig: Take 0.5 tablets (10 mg total) by mouth 2 (two) times daily with a meal.    Dispense:  30 tablet    Refill:  0    2: 3-please discontinue the lower dose prescriptions if any remaining    amphetamine-dextroamphetamine (ADDERALL) 20 MG tablet    Sig: Take 0.5 tablets (10 mg total) by mouth 2 (two) times daily.    Dispense:  30 tablet    Refill:  0    3: 3 prescriptions. please discontinue lower dose Adderall prescriptions, if any remaining   cetirizine (ZYRTEC) 10 MG tablet    Sig: Take 1 tablet (10 mg total) by mouth at bedtime.    Dispense:  90 tablet    Refill:  3   fluticasone (FLONASE) 50 MCG/ACT nasal spray    Sig: Place 2 sprays into both nostrils daily.    Dispense:  16 g    Refill:  6   meloxicam (MOBIC) 15 MG tablet    Sig: Take 1 tablet (15 mg total) by mouth daily.    Dispense:  90 tablet    Refill:  1   pantoprazole (PROTONIX) 40 MG tablet    Sig: Take 1 tablet (40 mg total) by mouth daily.    Dispense:  90 tablet    Refill:  1   venlafaxine XR (EFFEXOR XR) 150 MG 24 hr capsule    Sig: Take 1 capsule (150 mg total) by mouth daily with breakfast.    Dispense:  90 capsule    Refill:  1   Referral Orders  No referral(s) requested today    Felix Pacini, DO 12/01/2022

## 2022-12-03 ENCOUNTER — Telehealth: Payer: Self-pay

## 2022-12-03 NOTE — Telephone Encounter (Signed)
Brenda Ray (Key: [id number]) Rx #: [id number]  PA sent via covermymed on 12/03/22   Key: [id number]   Medication: Pantoprazole Sodium 40mg  dr tablets   Dx: K21.9   Per Dr.Kuneff,  pt has tried and failed n/a   Waiting for response.

## 2022-12-03 NOTE — Telephone Encounter (Signed)
Approved today Authorization Expiration Date: 12/03/2023

## 2022-12-03 NOTE — Telephone Encounter (Signed)
Brenda Ray (Key: [id number]) [id number] Pantoprazole Sodium 40MG  dr tablets Status: PA Request Created: May 6th, 2024 [phone] Sent: May 8th, 2024

## 2023-01-01 ENCOUNTER — Other Ambulatory Visit: Payer: Self-pay

## 2023-01-01 ENCOUNTER — Encounter (HOSPITAL_BASED_OUTPATIENT_CLINIC_OR_DEPARTMENT_OTHER): Payer: Self-pay | Admitting: Emergency Medicine

## 2023-01-01 ENCOUNTER — Emergency Department (HOSPITAL_BASED_OUTPATIENT_CLINIC_OR_DEPARTMENT_OTHER)
Admission: EM | Admit: 2023-01-01 | Discharge: 2023-01-01 | Disposition: A | Discharge status: Home / Self Care | Payer: BLUE CROSS/BLUE SHIELD | Attending: Emergency Medicine | Admitting: Emergency Medicine

## 2023-01-01 ENCOUNTER — Emergency Department (HOSPITAL_BASED_OUTPATIENT_CLINIC_OR_DEPARTMENT_OTHER): Payer: BLUE CROSS/BLUE SHIELD

## 2023-01-01 DIAGNOSIS — R1031 Right lower quadrant pain: Secondary | ICD-10-CM | POA: Insufficient documentation

## 2023-01-01 DIAGNOSIS — R3129 Other microscopic hematuria: Secondary | ICD-10-CM | POA: Diagnosis not present

## 2023-01-01 DIAGNOSIS — R110 Nausea: Secondary | ICD-10-CM | POA: Diagnosis not present

## 2023-01-01 DIAGNOSIS — R109 Unspecified abdominal pain: Secondary | ICD-10-CM | POA: Diagnosis present

## 2023-01-01 LAB — CBC WITH DIFFERENTIAL/PLATELET
Abs Immature Granulocytes: 0.01 10*3/uL (ref 0.00–0.07)
Basophils Absolute: 0 10*3/uL (ref 0.0–0.1)
Basophils Relative: 1 %
Eosinophils Absolute: 0.1 10*3/uL (ref 0.0–0.5)
Eosinophils Relative: 1 %
HCT: 42 % (ref 36.0–46.0)
Hemoglobin: 14.7 g/dL (ref 12.0–15.0)
Immature Granulocytes: 0 %
Lymphocytes Relative: 37 %
Lymphs Abs: 2.5 10*3/uL (ref 0.7–4.0)
MCH: 29.5 pg (ref 26.0–34.0)
MCHC: 35 g/dL (ref 30.0–36.0)
MCV: 84.2 fL (ref 80.0–100.0)
Monocytes Absolute: 0.4 10*3/uL (ref 0.1–1.0)
Monocytes Relative: 6 %
Neutro Abs: 3.6 10*3/uL (ref 1.7–7.7)
Neutrophils Relative %: 55 %
Platelets: 260 10*3/uL (ref 150–400)
RBC: 4.99 MIL/uL (ref 3.87–5.11)
RDW: 12.7 % (ref 11.5–15.5)
WBC: 6.6 10*3/uL (ref 4.0–10.5)
nRBC: 0 % (ref 0.0–0.2)

## 2023-01-01 LAB — LIPASE, BLOOD: Lipase: 40 U/L (ref 11–51)

## 2023-01-01 LAB — COMPREHENSIVE METABOLIC PANEL
ALT: 33 U/L (ref 0–44)
AST: 25 U/L (ref 15–41)
Albumin: 4.4 g/dL (ref 3.5–5.0)
Alkaline Phosphatase: 73 U/L (ref 38–126)
Anion gap: 10 (ref 5–15)
BUN: 14 mg/dL (ref 6–20)
CO2: 26 mmol/L (ref 22–32)
Calcium: 9.4 mg/dL (ref 8.9–10.3)
Chloride: 101 mmol/L (ref 98–111)
Creatinine, Ser: 0.97 mg/dL (ref 0.44–1.00)
GFR, Estimated: >60 mL/min (ref >60)
Glucose, Bld: 98 mg/dL (ref 70–99)
Potassium: 3.8 mmol/L (ref 3.5–5.1)
Sodium: 137 mmol/L (ref 135–145)
Total Bilirubin: 0.4 mg/dL (ref 0.3–1.2)
Total Protein: 8.1 g/dL (ref 6.5–8.1)

## 2023-01-01 LAB — URINALYSIS, ROUTINE W REFLEX MICROSCOPIC
Bilirubin Urine: NEGATIVE
Glucose, UA: NEGATIVE mg/dL
Ketones, ur: NEGATIVE mg/dL
Leukocytes,Ua: NEGATIVE
Nitrite: NEGATIVE
Protein, ur: NEGATIVE mg/dL
Specific Gravity, Urine: 1.025 (ref 1.005–1.030)
pH: 5.5 (ref 5.0–8.0)

## 2023-01-01 LAB — URINALYSIS, MICROSCOPIC (REFLEX)

## 2023-01-01 NOTE — Discharge Instructions (Addendum)
As we discussed, you your workup in the ER today was reassuring for acute findings.  Laboratory evaluation and imaging did not reveal any emergent concerns.  I do suspect that you had a kidney stone which passed given that you have some blood in your urine.  If this is the case, your pain should continue to improve over the next few days and then resolved completely.  I do recommend that you follow-up with your primary care doctor in the next week or so to have your urine rechecked to ensure that no longer have blood in your urine.  Return if development of any new or worsening symptoms.

## 2023-01-01 NOTE — ED Provider Notes (Signed)
[facility] Provider Note   CSN: [id number] Arrival date & time: 01/01/23  1158     History  Chief Complaint  Patient presents with   Abdominal Pain   Nausea    Brenda Ray is a 52 y.o. female.  Patient with history of GERD presents today with complaints of abdominal pain and nausea. She states that same began yesterday and has been persistent since. She states that the pain waxes and wanes without discernable trigger and is currently mild. Denies history of similar symptoms previously. Endorses some nausea without vomiting or diarrhea. Pain is in the right side and does not radiate. Endorses remote history of ex lap for endometriosis, denies any other history of abdominal surgeries. Denies fevers or chills. Denies urinary symptoms.   The history is provided by the patient. No language interpreter was used.  Abdominal Pain Associated symptoms: nausea        Home Medications Prior to Admission medications   Medication Sig Start Date End Date Taking? Authorizing Provider  amphetamine-dextroamphetamine (ADDERALL) 20 MG tablet Take 0.5 tablets (10 mg total) by mouth 2 (two) times daily with a meal. 12/01/22   Kuneff, Renee A, DO  amphetamine-dextroamphetamine (ADDERALL) 20 MG tablet Take 0.5 tablets (10 mg total) by mouth 2 (two) times daily with a meal. 12/01/22   Kuneff, Renee A, DO  amphetamine-dextroamphetamine (ADDERALL) 20 MG tablet Take 0.5 tablets (10 mg total) by mouth 2 (two) times daily. 12/01/22   Kuneff, Renee A, DO  cetirizine (ZYRTEC) 10 MG tablet Take 1 tablet (10 mg total) by mouth at bedtime. 12/01/22   Kuneff, Renee A, DO  Cholecalciferol (VITAMIN D-3) 25 MCG (1000 UT) CAPS Take by mouth.    [provider]  fluticasone (FLONASE) 50 MCG/ACT nasal spray Place 2 sprays into both nostrils daily. 12/01/22   Kuneff, Renee A, DO  meloxicam (MOBIC) 15 MG tablet Take 1 tablet (15 mg total) by mouth daily. 12/01/22   Kuneff,  Renee A, DO  pantoprazole (PROTONIX) 40 MG tablet Take 1 tablet (40 mg total) by mouth daily. 12/01/22   Kuneff, Renee A, DO  venlafaxine XR (EFFEXOR XR) 150 MG 24 hr capsule Take 1 capsule (150 mg total) by mouth daily with breakfast. 12/01/22   Kuneff, Renee A, DO      Allergies    Augmentin [amoxicillin-pot clavulanate], Doxycycline, and Hydrocodone    Review of Systems   Review of Systems  Gastrointestinal:  Positive for abdominal pain and nausea.  All other systems reviewed and are negative.   Physical Exam Updated Vital Signs BP (!) 142/76   Pulse 77   Temp 98.3 F (36.8 C) (Oral)   Resp 16   Ht 5\' 6"  (1.676 m)   Wt 83.9 kg   LMP 04/17/2019 (Exact Date)   SpO2 94%   BMI 29.86 kg/m  Physical Exam Vitals and nursing note reviewed.  Constitutional:      General: She is not in acute distress.    Appearance: Normal appearance. She is normal weight. She is not ill-appearing, toxic-appearing or diaphoretic.  HENT:     Head: Normocephalic and atraumatic.  Cardiovascular:     Rate and Rhythm: Normal rate.  Pulmonary:     Effort: Pulmonary effort is normal. No respiratory distress.  Abdominal:     General: Abdomen is flat.     Palpations: Abdomen is soft.     Tenderness: There is no guarding or rebound. Negative signs include Murphy's  sign and McBurney's sign.     Comments: Very mild tenderness to palpation of the right side.  Musculoskeletal:        General: Normal range of motion.     Cervical back: Normal range of motion.  Skin:    General: Skin is warm and dry.  Neurological:     General: No focal deficit present.     Mental Status: She is alert.  Psychiatric:        Mood and Affect: Mood normal.        Behavior: Behavior normal.     ED Results / Procedures / Treatments   Labs (all labs ordered are listed, but only abnormal results are displayed) Labs Reviewed  URINALYSIS, ROUTINE W REFLEX MICROSCOPIC - Abnormal; Notable for the following components:       Result Value   Hgb urine dipstick MODERATE (*)    All other components within normal limits  URINALYSIS, MICROSCOPIC (REFLEX) - Abnormal; Notable for the following components:   Bacteria, UA RARE (*)    All other components within normal limits  LIPASE, BLOOD  COMPREHENSIVE METABOLIC PANEL  CBC WITH DIFFERENTIAL/PLATELET    EKG None  Radiology CT Renal Stone Study  Result Date: 01/01/2023 CLINICAL DATA:  Flank pain, stone suspected. EXAM: CT ABDOMEN AND PELVIS WITHOUT CONTRAST TECHNIQUE: Multidetector CT imaging of the abdomen and pelvis was performed following the standard protocol without IV contrast. RADIATION DOSE REDUCTION: This exam was performed according to the departmental dose-optimization program which includes automated exposure control, adjustment of the mA and/or kV according to patient size and/or use of iterative reconstruction technique. COMPARISON:  CT abdomen pelvis 02/19/2022 FINDINGS: Lower chest: No acute abnormality. Evaluation of the abdominal viscera is limited by the lack IV contrast. Hepatobiliary: Stable small hypodensity in the left hepatic lobe. No new focal liver lesion. Gallbladder surgically absent. Pancreas: Unremarkable. No surrounding inflammatory changes. Spleen: Normal in size without focal abnormality. Adrenals/Urinary Tract: Adrenal glands are unremarkable. Multiple cysts in the left kidney. No hydronephrosis or renal calculi. Stomach/Bowel: Stomach is within normal limits. Appendix appears normal. No evidence of bowel wall thickening, distention, or inflammatory changes. Vascular/Lymphatic: Aortic atherosclerosis. No enlarged abdominal or pelvic lymph nodes. Reproductive: Uterus and bilateral adnexa are unremarkable. Other: No abdominal wall hernia or abnormality. No abdominopelvic ascites. Musculoskeletal: No acute or significant osseous findings. IMPRESSION: No acute intra-abdominal pathology on a noncontrast exam. No hydronephrosis or renal calculi. Aortic  Atherosclerosis (ICD10-I70.0). Electronically Signed   By: Emmaline Kluver M.D.   On: 01/01/2023 16:23    Procedures Procedures    Medications Ordered in ED Medications - No data to display  ED Course/ Medical Decision Making/ A&P                             Medical Decision Making Amount and/or Complexity of Data Reviewed Labs: ordered. Radiology: ordered.   This patient is a 52 y.o. female who presents to the ED for concern of right sided abdominal pain and nausea, this involves an extensive number of treatment options, and is a complaint that carries with it a high risk of complications and morbidity. The emergent differential diagnosis prior to evaluation includes, but is not limited to,  The differential diagnosis for generalized abdominal pain includes, but is not limited to kidney stone, AAA, gastroenteritis, appendicitis, Gastroparesis, DKA, Hernia, Inflammatory bowel disease, pancreatitis This is not an exhaustive differential.   Past Medical History / Co-morbidities / Social  History: Hx GERD, ex lap for endometriosis  Physical Exam: Physical exam performed. The pertinent findings include: mild right sided abdominal tenderness without rebound or guarding.  Negative McBurney's point sign  Lab Tests: I ordered, and personally interpreted labs.  The pertinent results include: No leukocytosis or anemia.  UA with hematuria, noninfectious.   Imaging Studies: I ordered imaging studies including CT renal. I independently visualized and interpreted imaging which showed   No acute intra-abdominal pathology on a noncontrast exam. No hydronephrosis or renal calculi.  I agree with the radiologist interpretation.  Disposition: After consideration of the diagnostic results and the patients response to treatment, I feel that emergency department workup does not suggest an emergent condition requiring admission or immediate intervention beyond what has been performed at this time.  The plan is: Discharge with close PCP follow-up.  Patient, patient offered pain medication which she declined.  Laboratory evaluation and imaging.  Had a kidney stone that passed prior to imaging given her description of symptoms and hematuria.  Discussed with patient who is understanding and in agreement.  She is able to eat and drink without any residual nausea or vomiting. Evaluation and diagnostic testing in the emergency department does not suggest an emergent condition requiring admission or immediate intervention beyond what has been performed at this time.  Plan for discharge with close PCP follow-up.  Patient is understanding and amenable with plan, educated on red flag symptoms that would prompt immediate return.  Patient discharged in stable condition.  Final Clinical Impression(s) / ED Diagnoses Final diagnoses:  Right lower quadrant abdominal pain  Other microscopic hematuria    Rx / DC Orders ED Discharge Orders     None     An After Visit Summary was printed and given to the patient.     Vear Clock 01/01/23 1703    Gwyneth Sprout, MD 01/05/23 [phone]

## 2023-01-01 NOTE — ED Triage Notes (Signed)
RLQ abdominal pain since yesterday associated with mild nausea. Pain has progressively worsened since onset. Also endorses urinary frequency. Describes pain as stabbing, constant, 8/10.

## 2023-01-19 ENCOUNTER — Encounter: Payer: Self-pay | Admitting: Family Medicine

## 2023-01-23 ENCOUNTER — Ambulatory Visit (INDEPENDENT_AMBULATORY_CARE_PROVIDER_SITE_OTHER): Payer: BLUE CROSS/BLUE SHIELD | Admitting: Family Medicine

## 2023-01-23 ENCOUNTER — Encounter: Payer: Self-pay | Admitting: Family Medicine

## 2023-01-23 VITALS — BP 128/74 | HR 81 | Temp 97.8°F | Ht 66.0 in | Wt 191.8 lb

## 2023-01-23 DIAGNOSIS — R41840 Attention and concentration deficit: Secondary | ICD-10-CM | POA: Diagnosis not present

## 2023-01-23 DIAGNOSIS — R413 Other amnesia: Secondary | ICD-10-CM | POA: Diagnosis not present

## 2023-01-23 NOTE — Progress Notes (Unsigned)
Patient Care Team    Relationship Specialty Notifications Start End  Natalia Leatherwood, DO PCP - General Family Medicine  05/01/15      SUBJECTIVE Chief Complaint  Patient presents with   Discuss Medication     Pt would like to discuss medication dose change; medication is not as effective as it was when she first started.   HPI: Brenda Ray is a 52 y.o. female present for [facility] follow up after start of new medication  Anxiety/hot flashes/fatigue/attention: Patient reports she believes the Effexor 150 mg is working well for her.  She is no longer taking the BuSpar.  She feels the Adderall 20 mg once daily is now not  working as well for her.   Prior note: She states she does feel shaky in the morning before she takes her medicines.  When she takes her medicine she feels her anxiety is well-controlled.  She does complain of having difficulty with brain fog and focus now.  She states her long-term memory seems to be declining.  She reports compliance with her B12 and vitamin D. Patient reports her anxiety is starting to increase a small amount.  She states initially she thought the Effexor 75 mg daily and the BuSpar twice daily was working great, but now she started to get that anxious shaky feeling again and thinks maybe she should go up on the Effexor.  She thinks the vitamin supplements have helped with the fatigue.  She also is using the Atarax on occasions and feels it is working well for her. Tried: paxil (sleepy), ativan (sleepy at low doses)  _____________documentation only_________________ Shoulder pain: She is she has continued to use the meloxicam with flares of her shoulder pain.  Meloxicam works well for her as needed  GERD:she is compliant with protonix 40 mg qd.   Allergies: Patient is compliant with Zyrtec nightly and Flonase nasal spray daily, this combination seems to work well for her.  Review of Systems  All other systems reviewed and are negative.       01/23/2023   12:58 PM 12/01/2022    8:36 AM 08/01/2022   10:05 AM 02/06/2022   12:11 PM 09/30/2021    8:23 AM  Depression screen PHQ 2/9  Decreased Interest 0 0 1 0 1  Down, Depressed, Hopeless 0 0 0 1 0  PHQ - 2 Score 0 0 1 1 1   Altered sleeping   2 1 2   Tired, decreased energy   2 0 3  Change in appetite   0 1 0  Feeling bad or failure about yourself    0 0 0  Trouble concentrating   3 0 0  Moving slowly or fidgety/restless   2 0 0  Suicidal thoughts   0 0 0  PHQ-9 Score   10 3 6       01/23/2023   12:59 PM 08/01/2022   10:05 AM 02/06/2022   12:12 PM 09/30/2021    8:24 AM  GAD 7 : Generalized Anxiety Score  Nervous, Anxious, on Edge 0 1 1 1   Control/stop worrying 0 0 0 0  Worry too much - different things 0 1 0 1  Trouble relaxing 3 0 1   Restless 0 1 0 0  Easily annoyed or irritable 0 1 0 0  Afraid - awful might happen 0 0 0 0  Total GAD 7 Score 3 4 2    Anxiety Difficulty Very difficult       Patient  Active Problem List   Diagnosis Date Noted   Attention and concentration deficit 09/05/2022   Memory changes 08/08/2022   Insulin resistance 10/08/2021   Obesity with serious comorbidity 10/08/2021   Allergic rhinitis 08/29/2021   GERD without esophagitis 08/22/2020   Chronic shoulder pain 08/02/2018   Benzodiazepine contract exists 10/05/2017   Anxiety 10/02/2017   Hot flashes due to menopause 10/02/2017   Stress incontinence 10/01/2015    Social History   Tobacco Use   Smoking status: Former    Packs/day: 0.50    Years: 36.00    Additional pack years: 0.00    Total pack years: 18.00    Types: Cigarettes, E-cigarettes    Quit date: 02/25/2017    Years since quitting: 5.9   Smokeless tobacco: Former  Substance Use Topics   Alcohol use: Not Currently    Current Outpatient Medications:    cetirizine (ZYRTEC) 10 MG tablet, Take 1 tablet (10 mg total) by mouth at bedtime., Disp: 90 tablet, Rfl: 3   Cholecalciferol (VITAMIN D-3) 25 MCG (1000 UT) CAPS, Take by mouth.,  Disp: , Rfl:    fluticasone (FLONASE) 50 MCG/ACT nasal spray, Place 2 sprays into both nostrils daily., Disp: 16 g, Rfl: 6   pantoprazole (PROTONIX) 40 MG tablet, Take 1 tablet (40 mg total) by mouth daily., Disp: 90 tablet, Rfl: 1   venlafaxine XR (EFFEXOR XR) 150 MG 24 hr capsule, Take 1 capsule (150 mg total) by mouth daily with breakfast., Disp: 90 capsule, Rfl: 1   amphetamine-dextroamphetamine (ADDERALL) 20 MG tablet, Take 1 tablet (20 mg total) by mouth 2 (two) times daily with a meal., Disp: 60 tablet, Rfl: 0   [START ON 02/18/2023] amphetamine-dextroamphetamine (ADDERALL) 20 MG tablet, Take 1 tablet (20 mg total) by mouth 2 (two) times daily., Disp: 60 tablet, Rfl: 0   [START ON 03/13/2023] amphetamine-dextroamphetamine (ADDERALL) 20 MG tablet, Take 1 tablet (20 mg total) by mouth 2 (two) times daily with a meal., Disp: 60 tablet, Rfl: 0   meloxicam (MOBIC) 15 MG tablet, Take 1 tablet (15 mg total) by mouth daily. (Patient not taking: Reported on 01/23/2023), Disp: 90 tablet, Rfl: 1  Allergies  Allergen Reactions   Augmentin [Amoxicillin-Pot Clavulanate] Diarrhea   Doxycycline Nausea And Vomiting   Hydrocodone Itching    OBJECTIVE: BP 128/74   Pulse 81   Temp 97.8 F (36.6 C)   Ht 5\' 6"  (1.676 m)   Wt 191 lb 12.8 oz (87 kg)   LMP 04/17/2019 (Exact Date)   SpO2 97%   BMI 30.96 kg/m  Physical Exam Vitals and nursing note reviewed.  Constitutional:      General: She is not in acute distress.    Appearance: Normal appearance. She is normal weight. She is not ill-appearing or toxic-appearing.  HENT:     Head: Normocephalic and atraumatic.  Eyes:     General: No scleral icterus.       Right eye: No discharge.        Left eye: No discharge.     Extraocular Movements: Extraocular movements intact.     Conjunctiva/sclera: Conjunctivae normal.     Pupils: Pupils are equal, round, and reactive to light.  Cardiovascular:     Rate and Rhythm: Normal rate and regular rhythm.   Skin:    Findings: No rash.  Neurological:     Mental Status: She is alert and oriented to person, place, and time. Mental status is at baseline.     Motor: No weakness.  Coordination: Coordination normal.     Gait: Gait normal.  Psychiatric:        Mood and Affect: Mood normal.        Behavior: Behavior normal.        Thought Content: Thought content normal.        Judgment: Judgment normal.     ASSESSMENT AND PLAN: Brenda Ray is a 52 y.o. female present for  Memory changes/fatigue/attention deficit Cussed with patient she is on the short acting Adderall, therefore she would need to take twice a day in order to feel the full benefit after lunch. Increase Adderall 20 mg twice daily.  3 prescription sent to pharmacy. [place] controlled substance database reviewed and appropriate 01/26/23 -Documentation only- Chronic pain of left shoulder Stable Continue Mobic 15 mg daily.  GERD without esophagitis Stable Continue Protonix   Non-seasonal allergic rhinitis due to pollen Continue Flonase Continue zyrtec  B12 deficiency/Vitamin D deficiency Vitamin D and B12 levels UTD  Anxiety/Hot flashes due to menopauseBenzodiazepine contract exists/panic Stable Continue Effexor 150 mg daily Tried: celexa.  BuSpar Follow-up 5.5 months   Return in about 11 weeks (around 04/10/2023) for Routine chronic condition follow-up.   No orders of the defined types were placed in this encounter.  Meds ordered this encounter  Medications   amphetamine-dextroamphetamine (ADDERALL) 20 MG tablet    Sig: Take 1 tablet (20 mg total) by mouth 2 (two) times daily with a meal.    Dispense:  60 tablet    Refill:  0    1: 3 prescriptions.  Please discontinue  lower dose Adderall prescriptions, if any remaining   amphetamine-dextroamphetamine (ADDERALL) 20 MG tablet    Sig: Take 1 tablet (20 mg total) by mouth 2 (two) times daily.    Dispense:  60 tablet    Refill:  0    3: 3  prescriptions. please discontinue lower dose Adderall prescriptions, if any remaining   amphetamine-dextroamphetamine (ADDERALL) 20 MG tablet    Sig: Take 1 tablet (20 mg total) by mouth 2 (two) times daily with a meal.    Dispense:  60 tablet    Refill:  0    2: 3-please discontinue the lower dose prescriptions if any remaining   Referral Orders  No referral(s) requested today    Felix Pacini, DO 01/26/2023

## 2023-01-23 NOTE — Patient Instructions (Addendum)
Return in about 11 weeks (around 04/10/2023) for Routine chronic condition follow-up.        Great to see you today.  I have refilled the medication(s) we provide.   If labs were collected, we will inform you of lab results once received either by echart message or telephone call.   - echart message- for normal results that have been seen by the patient already.   - telephone call: abnormal results or if patient has not viewed results in their echart.

## 2023-01-24 ENCOUNTER — Encounter: Payer: Self-pay | Admitting: Family Medicine

## 2023-01-24 ENCOUNTER — Other Ambulatory Visit: Payer: Self-pay | Admitting: Family Medicine

## 2023-01-26 MED ORDER — AMPHETAMINE-DEXTROAMPHETAMINE 20 MG PO TABS: 20.0000 mg | ORAL_TABLET | Freq: Two times a day (BID) | ORAL | 0 refills | Status: DC

## 2023-01-26 NOTE — Telephone Encounter (Signed)
Please inform patient I called in the medication today.  Most insurances do not allow a 90-day supply of this medication to be called in. I did call in a 30-day supply with 2 scripts for the pharmacy to hold and be released monthly.  She will need to call there and ask for the prescription by name since it is technically not a "refill.  "

## 2023-01-30 ENCOUNTER — Ambulatory Visit: Payer: BLUE CROSS/BLUE SHIELD | Admitting: Family Medicine

## 2023-02-03 ENCOUNTER — Encounter: Payer: Self-pay | Admitting: Family Medicine

## 2023-02-04 NOTE — Telephone Encounter (Signed)
Will need to contact pharmacy to confirm pick up date and dispense

## 2023-02-19 ENCOUNTER — Encounter: Payer: Self-pay | Admitting: Family Medicine

## 2023-02-20 ENCOUNTER — Ambulatory Visit (INDEPENDENT_AMBULATORY_CARE_PROVIDER_SITE_OTHER): Payer: BLUE CROSS/BLUE SHIELD

## 2023-02-20 ENCOUNTER — Telehealth: Payer: Self-pay | Admitting: Family Medicine

## 2023-02-20 ENCOUNTER — Encounter: Payer: Self-pay | Admitting: Family Medicine

## 2023-02-20 ENCOUNTER — Telehealth (INDEPENDENT_AMBULATORY_CARE_PROVIDER_SITE_OTHER): Payer: BLUE CROSS/BLUE SHIELD | Admitting: Family Medicine

## 2023-02-20 VITALS — Temp 100.0°F

## 2023-02-20 DIAGNOSIS — J329 Chronic sinusitis, unspecified: Secondary | ICD-10-CM | POA: Diagnosis not present

## 2023-02-20 DIAGNOSIS — B9689 Other specified bacterial agents as the cause of diseases classified elsewhere: Secondary | ICD-10-CM | POA: Diagnosis not present

## 2023-02-20 MED ORDER — HYDROCODONE BIT-HOMATROP MBR 5-1.5 MG/5ML PO SOLN: 5.0000 mL | ORAL | 0 refills | Status: DC | PRN

## 2023-02-20 MED ORDER — METHYLPREDNISOLONE ACETATE 80 MG/ML IJ SUSP
80.0000 mg | Freq: Once | INTRAMUSCULAR | Status: AC
Start: 2023-02-20 — End: 2023-02-20
  Administered 2023-02-20: 80 mg via INTRAMUSCULAR

## 2023-02-20 MED ORDER — HYDROCODONE BIT-HOMATROP MBR 5-1.5 MG/5ML PO SOLN: 5.0000 mL | Freq: Two times a day (BID) | ORAL | 0 refills | Status: DC | PRN

## 2023-02-20 MED ORDER — CEFDINIR 300 MG PO CAPS
300.0000 mg | ORAL_CAPSULE | Freq: Two times a day (BID) | ORAL | 0 refills | Status: DC
Start: 2023-02-20 — End: 2023-05-15

## 2023-02-20 NOTE — Telephone Encounter (Signed)
PCP has resent Rx

## 2023-02-20 NOTE — Addendum Note (Signed)
Addended by: Felix Pacini A on: 02/20/2023 09:43 AM   Modules accepted: Orders

## 2023-02-20 NOTE — Progress Notes (Addendum)
VIRTUAL VISIT VIA VIDEO  I connected with Brenda Ray on 02/20/23 at  8:20 AM EDT by a video enabled telemedicine application and verified that I am speaking with the correct person using two identifiers. Location patient: Home Location provider: [facility], Office Persons participating in the virtual visit: Patient, Dr. Claiborne Billings and Ivonne Andrew, CMA  I discussed the limitations of evaluation and management by telemedicine and the availability of in person appointments. The patient expressed understanding and agreed to proceed.     Brenda Ray , 08/16/1970, 52 y.o., female MRN: [id number] Patient Care Team    Relationship Specialty Notifications Start End  Natalia Leatherwood, DO PCP - General Family Medicine  05/01/15     Chief Complaint  Patient presents with   Facial Pain    Thick mucus; taste of blood; fever; COVID test negative on 07/23     Subjective: Brenda Ray is a 52 y.o. Pt presents for an OV with complaints of sinus pain of 02/15/2023 duration.  Associated symptoms include negative coid 7/23. She reports she was on vacation last week ad her sinuses started flaring. There are also people at her office out ill. Low grade fever 100 F, headache, thick drainage, cough, PND. Sinus pain.  She has had no wheezing or shortness of breath Pt has tried mucinex, nsaids, floanse for headache to ease their symptoms.      02/20/2023    9:29 AM 01/23/2023   12:58 PM 12/01/2022    8:36 AM 08/01/2022   10:05 AM 02/06/2022   12:11 PM  Depression screen PHQ 2/9  Decreased Interest 0 0 0 1 0  Down, Depressed, Hopeless 0 0 0 0 1  PHQ - 2 Score 0 0 0 1 1  Altered sleeping    2 1  Tired, decreased energy    2 0  Change in appetite    0 1  Feeling bad or failure about yourself     0 0  Trouble concentrating    3 0  Moving slowly or fidgety/restless    2 0  Suicidal thoughts    0 0  PHQ-9 Score    10 3    Allergies  Allergen Reactions   Augmentin [Amoxicillin-Pot  Clavulanate] Diarrhea   Doxycycline Nausea And Vomiting   Hydrocodone Itching   Social History   Social History Narrative   Lives with Son and significant other. Engaged to be married.   Bus Arts administrator at the [employer]   Every day smoker   No etoh. No drugs. Wears her seatbelt.    Does not exercise daily.    Firearms present in the home in a gun safe.   Smoke alarm in the home.    Feel safe in relationships.    Past Medical History:  Diagnosis Date   Abnormal Pap smear of cervix    age 69   Anemia    Anxiety    use to use / take Lorazepam ., last episode of panic  2 yrs. ago   Arthritis    hands, knees, back - Osteo   Colon polyps    she reports colon polyps about 20 years ago; she was told 5 yr follow up.    Complication of anesthesia    woke up before the tube was removed    Cystitis    Facial nerve disorder    GERD (gastroesophageal reflux disease)    Hiatal hernia  reports seen on EGD completed in her 51s [place] [facility]. Completed for pain and FOBT +.    History of chicken pox    Pneumonia    as a child- hosp.    Vaginal delivery    x2   Past Surgical History:  Procedure Laterality Date   ANTERIOR CERVICAL DECOMP/DISCECTOMY FUSION  2017   C3-C5   BREAST BIOPSY Left 2015   CERVICAL DISCECTOMY  2014   CHOLECYSTECTOMY N/A 06/16/2014   Procedure: LAPAROSCOPIC CHOLECYSTECTOMY ;  Surgeon: Abigail Miyamoto, MD;  Location: [facility] OR;  Service: General;  Laterality: N/A;  laparoscopic cholecystectomy   GYNECOLOGIC CRYOSURGERY     age 44   LAPAROSCOPIC ABDOMINAL EXPLORATION     for endometrimosis    Family History  Problem Relation Age of Onset   Arthritis Mother    Hyperlipidemia Mother    Hypertension Mother    Thyroid disease Mother        hypothyroid   Arthritis Father    Hyperlipidemia Father    Hypertension Father    Bladder Cancer Father    Prostate cancer Father    Thyroid disease Brother    Arthritis Maternal Grandmother    Arthritis  Maternal Grandfather    Arthritis Paternal Grandmother    Arthritis Paternal Grandfather    Breast cancer Maternal Aunt 50   Allergies as of 02/20/2023       Reactions   Augmentin [amoxicillin-pot Clavulanate] Diarrhea   Doxycycline Nausea And Vomiting   Hydrocodone Itching        Medication List        Accurate as of February 20, 2023  9:43 AM. If you have any questions, ask your nurse or doctor.          amphetamine-dextroamphetamine 20 MG tablet Commonly known as: ADDERALL Take 1 tablet (20 mg total) by mouth 2 (two) times daily with a meal.   amphetamine-dextroamphetamine 20 MG tablet Commonly known as: ADDERALL Take 1 tablet (20 mg total) by mouth 2 (two) times daily.   amphetamine-dextroamphetamine 20 MG tablet Commonly known as: ADDERALL Take 1 tablet (20 mg total) by mouth 2 (two) times daily with a meal. Start taking on: March 13, 2023   cefdinir 300 MG capsule Commonly known as: OMNICEF Take 1 capsule (300 mg total) by mouth 2 (two) times daily. Started by: Felix Pacini   cetirizine 10 MG tablet Commonly known as: ZYRTEC Take 1 tablet (10 mg total) by mouth at bedtime.   fluticasone 50 MCG/ACT nasal spray Commonly known as: FLONASE Place 2 sprays into both nostrils daily.   HYDROcodone bit-homatropine 5-1.5 MG/5ML syrup Commonly known as: HYCODAN Take 5 mLs by mouth every 12 (twelve) hours as needed for cough. Started by: Felix Pacini   meloxicam 15 MG tablet Commonly known as: MOBIC Take 1 tablet (15 mg total) by mouth daily.   pantoprazole 40 MG tablet Commonly known as: PROTONIX Take 1 tablet (40 mg total) by mouth daily.   venlafaxine XR 150 MG 24 hr capsule Commonly known as: Effexor XR Take 1 capsule (150 mg total) by mouth daily with breakfast.   Vitamin D-3 25 MCG (1000 UT) Caps Take by mouth.        All past medical history, surgical history, allergies, family history, immunizations andmedications were updated in the EMR today  and reviewed under the history and medication portions of their EMR.     ROS Negative, with the exception of above mentioned in HPI   Objective:  Temp  100 F (37.8 C)   LMP 04/17/2019 (Exact Date)  There is no height or weight on file to calculate BMI.  Physical Exam Vitals and nursing note reviewed.  Constitutional:      General: She is not in acute distress.    Appearance: Normal appearance. She is not toxic-appearing.  HENT:     Head: Normocephalic and atraumatic.  Eyes:     General: No scleral icterus.       Right eye: No discharge.        Left eye: No discharge.     Conjunctiva/sclera: Conjunctivae normal.  Pulmonary:     Effort: Pulmonary effort is normal.  Musculoskeletal:     Cervical back: Normal range of motion.  Skin:    Findings: No rash.  Neurological:     Mental Status: She is alert and oriented to person, place, and time. Mental status is at baseline.  Psychiatric:        Mood and Affect: Mood normal.        Behavior: Behavior normal.        Thought Content: Thought content normal.        Judgment: Judgment normal.     No results found. No results found. No results found for this or any previous visit (from the past 24 hour(s)).  Assessment/Plan: Brenda Ray is a 52 y.o. female present for OV for  Bacterial sinusitis Rest, hydrate.  +/- flonase, mucinex (DM if cough), nettie pot or nasal saline.  Omnicef BID x10d prescribed, take until completed. IM depo medrol will be provided today by nurse visit.   Hycodan prescribed for cough at bedtime prn If cough present it can last up to 6-8 weeks.  F/U 2 weeks if not improved.   Reviewed expectations re: course of current medical issues. Discussed self-management of symptoms. Outlined signs and symptoms indicating need for more acute intervention. Patient verbalized understanding and all questions were answered. Patient received an After-Visit Summary.    No orders of the defined types were  placed in this encounter.  Meds ordered this encounter  Medications   cefdinir (OMNICEF) 300 MG capsule    Sig: Take 1 capsule (300 mg total) by mouth 2 (two) times daily.    Dispense:  20 capsule    Refill:  0   DISCONTD: HYDROcodone bit-homatropine (HYCODAN) 5-1.5 MG/5ML syrup    Sig: Take 5 mLs by mouth every hour as needed for cough.    Dispense:  120 mL    Refill:  0   HYDROcodone bit-homatropine (HYCODAN) 5-1.5 MG/5ML syrup    Sig: Take 5 mLs by mouth every 12 (twelve) hours as needed for cough.    Dispense:  120 mL    Refill:  0   Referral Orders  No referral(s) requested today     Note is dictated utilizing voice recognition software. Although note has been proof read prior to signing, occasional typographical errors still can be missed. If any questions arise, please do not hesitate to call for verification.   electronically signed by:  Felix Pacini, DO  [facility] - [place]

## 2023-02-20 NOTE — Addendum Note (Signed)
Addended by: Felix Pacini A on: 02/20/2023 09:39 AM   Modules accepted: Orders

## 2023-02-20 NOTE — Telephone Encounter (Signed)
Patient went to pick up cefdinir (OMNICEF) 300 MG capsule , however the pharmacy states that it is written incorrectly and they will not fill until the pharmacy([facility])  is contacted.

## 2023-02-23 NOTE — Progress Notes (Signed)
Pt came for injection following video visit with provider

## 2023-02-24 ENCOUNTER — Encounter: Payer: Self-pay | Admitting: Family Medicine

## 2023-05-15 ENCOUNTER — Ambulatory Visit (INDEPENDENT_AMBULATORY_CARE_PROVIDER_SITE_OTHER): Payer: Self-pay | Admitting: Family Medicine

## 2023-05-15 DIAGNOSIS — N951 Menopausal and female climacteric states: Secondary | ICD-10-CM

## 2023-05-15 DIAGNOSIS — R41840 Attention and concentration deficit: Secondary | ICD-10-CM

## 2023-05-15 DIAGNOSIS — M25512 Pain in left shoulder: Secondary | ICD-10-CM

## 2023-05-15 DIAGNOSIS — K219 Gastro-esophageal reflux disease without esophagitis: Secondary | ICD-10-CM

## 2023-05-15 DIAGNOSIS — F419 Anxiety disorder, unspecified: Secondary | ICD-10-CM

## 2023-05-15 DIAGNOSIS — Z91199 Patient's noncompliance with other medical treatment and regimen due to unspecified reason: Secondary | ICD-10-CM

## 2023-05-15 DIAGNOSIS — G8929 Other chronic pain: Secondary | ICD-10-CM

## 2023-05-15 DIAGNOSIS — M25511 Pain in right shoulder: Secondary | ICD-10-CM

## 2023-05-15 NOTE — Progress Notes (Signed)
No show

## 2023-05-18 ENCOUNTER — Ambulatory Visit: Payer: Self-pay | Admitting: Family Medicine

## 2023-05-19 ENCOUNTER — Encounter: Payer: Self-pay | Admitting: Family Medicine

## 2023-05-19 ENCOUNTER — Telehealth (INDEPENDENT_AMBULATORY_CARE_PROVIDER_SITE_OTHER): Payer: Self-pay | Admitting: Family Medicine

## 2023-05-19 DIAGNOSIS — M25512 Pain in left shoulder: Secondary | ICD-10-CM

## 2023-05-19 DIAGNOSIS — G8929 Other chronic pain: Secondary | ICD-10-CM

## 2023-05-19 DIAGNOSIS — F419 Anxiety disorder, unspecified: Secondary | ICD-10-CM

## 2023-05-19 DIAGNOSIS — N951 Menopausal and female climacteric states: Secondary | ICD-10-CM

## 2023-05-19 DIAGNOSIS — M25511 Pain in right shoulder: Secondary | ICD-10-CM

## 2023-05-19 DIAGNOSIS — R41840 Attention and concentration deficit: Secondary | ICD-10-CM

## 2023-05-19 DIAGNOSIS — K219 Gastro-esophageal reflux disease without esophagitis: Secondary | ICD-10-CM

## 2023-05-19 MED ORDER — AMPHETAMINE-DEXTROAMPHETAMINE 20 MG PO TABS: 20.0000 mg | ORAL_TABLET | Freq: Two times a day (BID) | ORAL | 0 refills | Status: DC

## 2023-05-19 MED ORDER — VENLAFAXINE HCL ER 150 MG PO CP24: 150.0000 mg | ORAL_CAPSULE | Freq: Every day | ORAL | 1 refills | Status: DC

## 2023-05-19 NOTE — Patient Instructions (Signed)

## 2023-05-19 NOTE — Progress Notes (Signed)
VIRTUAL VISIT VIA VIDEO  I connected with Brenda Ray on 05/19/23 at  9:00 AM EDT by a video enabled telemedicine application and verified that I am speaking with the correct person using two identifiers. Location patient: Home Location provider: [facility], Office Persons participating in the virtual visit: Patient, Dr. Claiborne Billings and Brenda Ray, CMA  I discussed the limitations of evaluation and management by telemedicine and the availability of in person appointments. The patient expressed understanding and agreed to proceed.    Patient Care Team    Relationship Specialty Notifications Start End  Brenda Leatherwood, DO PCP - General Family Medicine  05/01/15     SUBJECTIVE Chief Complaint  Patient presents with   ADD   HPI: Brenda Ray is a 52 y.o. female present for [facility] follow up after start of new medication  Anxiety/hot flashes/fatigue/attention: Patient reports she believes the Effexor 150 mg is working well for her.  She is no longer taking the BuSpar.   She feels the Adderall 20 mg BID is working well.   Prior note: She states she does feel shaky in the morning before she takes her medicines.  When she takes her medicine she feels her anxiety is well-controlled.  She does complain of having difficulty with brain fog and focus now.  She states her long-term memory seems to be declining.  She reports compliance with her B12 and vitamin D. Patient reports her anxiety is starting to increase a small amount.  She states initially she thought the Effexor 75 mg daily and the BuSpar twice daily was working great, but now she started to get that anxious shaky feeling again and thinks maybe she should go up on the Effexor.  She thinks the vitamin supplements have helped with the fatigue.  She also is using the Atarax on occasions and feels it is working well for her. Tried: paxil (sleepy), ativan (sleepy at low doses)  Shoulder pain: She is she has continued to use the  meloxicam with flares of her shoulder pain.  Meloxicam works well for her as needed  GERD:she is compliant  with protonix 40 mg qd.   Allergies: Patient is compliant  with Zyrtec nightly and Flonase nasal spray daily, this combination seems to work well for her.  Review of Systems  All other systems reviewed and are negative.      05/19/2023    8:57 AM 02/20/2023    9:29 AM 01/23/2023   12:58 PM 12/01/2022    8:36 AM 08/01/2022   10:05 AM  Depression screen PHQ 2/9  Decreased Interest 0 0 0 0 1  Down, Depressed, Hopeless 0 0 0 0 0  PHQ - 2 Score 0 0 0 0 1  Altered sleeping 0    2  Tired, decreased energy 0    2  Change in appetite 0    0  Feeling bad or failure about yourself  0    0  Trouble concentrating 0    3  Moving slowly or fidgety/restless 0    2  Suicidal thoughts 0    0  PHQ-9 Score 0    10  Difficult doing work/chores Not difficult at all          05/19/2023    8:57 AM 01/23/2023   12:59 PM 08/01/2022   10:05 AM 02/06/2022   12:12 PM  GAD 7 : Generalized Anxiety Score  Nervous, Anxious, on Edge 0 0 1 1  Control/stop worrying 0 0 0 0  Worry too much - different things 0 0 1 0  Trouble relaxing 0 3 0 1  Restless 0 0 1 0  Easily annoyed or irritable 0 0 1 0  Afraid - awful might happen 0 0 0 0  Total GAD 7 Score 0 3 4 2   Anxiety Difficulty  Very difficult      Patient Active Problem List   Diagnosis Date Noted   Attention and concentration deficit 09/05/2022   Obesity with serious comorbidity 10/08/2021   Allergic rhinitis 08/29/2021   GERD without esophagitis 08/22/2020   Chronic shoulder pain 08/02/2018   Benzodiazepine contract exists 10/05/2017   Anxiety 10/02/2017   Hot flashes due to menopause 10/02/2017   Stress incontinence 10/01/2015    Social History   Tobacco Use   Smoking status: Former    Current packs/day: 0.00    Average packs/day: 0.5 packs/day for 36.0 years (18.0 ttl pk-yrs)    Types: Cigarettes, E-cigarettes    Start date:  02/25/1981    Quit date: 02/25/2017    Years since quitting: 6.2   Smokeless tobacco: Former  Substance Use Topics   Alcohol use: Not Currently    Current Outpatient Medications:    Cholecalciferol (VITAMIN D-3) 25 MCG (1000 UT) CAPS, Take by mouth., Disp: , Rfl:    fluticasone (FLONASE) 50 MCG/ACT nasal spray, Place 2 sprays into both nostrils daily., Disp: 16 g, Rfl: 6   meloxicam (MOBIC) 15 MG tablet, Take 1 tablet (15 mg total) by mouth daily., Disp: 90 tablet, Rfl: 1   pantoprazole (PROTONIX) 40 MG tablet, Take 1 tablet (40 mg total) by mouth daily., Disp: 90 tablet, Rfl: 1   amphetamine-dextroamphetamine (ADDERALL) 20 MG tablet, Take 1 tablet (20 mg total) by mouth 2 (two) times daily with a meal., Disp: 60 tablet, Rfl: 0   [START ON 06/11/2023] amphetamine-dextroamphetamine (ADDERALL) 20 MG tablet, Take 1 tablet (20 mg total) by mouth 2 (two) times daily., Disp: 60 tablet, Rfl: 0   [START ON 07/04/2023] amphetamine-dextroamphetamine (ADDERALL) 20 MG tablet, Take 1 tablet (20 mg total) by mouth 2 (two) times daily with a meal., Disp: 60 tablet, Rfl: 0   venlafaxine XR (EFFEXOR XR) 150 MG 24 hr capsule, Take 1 capsule (150 mg total) by mouth daily with breakfast., Disp: 90 capsule, Rfl: 1  Allergies  Allergen Reactions   Augmentin [Amoxicillin-Pot Clavulanate] Diarrhea   Doxycycline Nausea And Vomiting   Hydrocodone Itching    OBJECTIVE: LMP 04/17/2019 (Exact Date)  Physical Exam Vitals and nursing note reviewed.  Constitutional:      General: She is not in acute distress.    Appearance: Normal appearance. She is not toxic-appearing.  HENT:     Head: Normocephalic and atraumatic.  Eyes:     General: No scleral icterus.       Right eye: No discharge.        Left eye: No discharge.     Conjunctiva/sclera: Conjunctivae normal.  Pulmonary:     Effort: Pulmonary effort is normal.  Musculoskeletal:     Cervical back: Normal range of motion.  Skin:    Findings: No rash.   Neurological:     Mental Status: She is alert and oriented to person, place, and time. Mental status is at baseline.  Psychiatric:        Mood and Affect: Mood normal.        Behavior: Behavior normal.        Thought Content:  Thought content normal.        Judgment: Judgment normal.     ASSESSMENT AND PLAN: KATHERLINE DAVIE is a 52 y.o. female present for  Memory changes/fatigue/attention deficit Stable Continue adderall 20 bid .[employer] may consider long acting if covered by new insurance.  [place] controlled substance database reviewed and appropriate 05/19/23  Chronic pain of left shoulder Stable Continue  Mobic 15 mg daily. prn  GERD without esophagitis Stable Continue  Protonix prn  Non-seasonal allergic rhinitis due to pollen Continue Flonase Continue zyrtec  B12 deficiency/Vitamin D deficiency Vitamin D and B12 levels UTD  Anxiety/Hot flashes due to menopauseBenzodiazepine contract exists/panic Stable continue Effexor 150 mg daily Tried: celexa.  BuSpar Follow-up 5.5 months   Return in about 11 weeks (around 08/04/2023) for Routine chronic condition follow-up.   No orders of the defined types were placed in this encounter.  Meds ordered this encounter  Medications   amphetamine-dextroamphetamine (ADDERALL) 20 MG tablet    Sig: Take 1 tablet (20 mg total) by mouth 2 (two) times daily with a meal.    Dispense:  60 tablet    Refill:  0   amphetamine-dextroamphetamine (ADDERALL) 20 MG tablet    Sig: Take 1 tablet (20 mg total) by mouth 2 (two) times daily.    Dispense:  60 tablet    Refill:  0    3: 3 prescriptions. please discontinue lower dose Adderall prescriptions, if any remaining   amphetamine-dextroamphetamine (ADDERALL) 20 MG tablet    Sig: Take 1 tablet (20 mg total) by mouth 2 (two) times daily with a meal.    Dispense:  60 tablet    Refill:  0    2: 3-please discontinue the lower dose prescriptions if any remaining   venlafaxine XR  (EFFEXOR XR) 150 MG 24 hr capsule    Sig: Take 1 capsule (150 mg total) by mouth daily with breakfast.    Dispense:  90 capsule    Refill:  1   Referral Orders  No referral(s) requested today    Felix Pacini, DO 05/19/2023

## 2023-05-21 ENCOUNTER — Ambulatory Visit: Payer: Self-pay | Admitting: Family Medicine

## 2023-06-11 ENCOUNTER — Encounter: Payer: Self-pay | Admitting: Family Medicine

## 2023-07-17 ENCOUNTER — Ambulatory Visit: Payer: Self-pay | Admitting: Family Medicine

## 2024-01-12 ENCOUNTER — Ambulatory Visit: Admitting: Family Medicine

## 2024-01-12 ENCOUNTER — Encounter: Payer: Self-pay | Admitting: Family Medicine

## 2024-01-12 VITALS — BP 122/78 | HR 88 | Temp 98.3°F | Wt 187.0 lb

## 2024-01-12 DIAGNOSIS — H6992 Unspecified Eustachian tube disorder, left ear: Secondary | ICD-10-CM

## 2024-01-12 MED ORDER — FLUTICASONE PROPIONATE 50 MCG/ACT NA SUSP: 2.0000 | Freq: Every day | NASAL | 6 refills | Status: AC

## 2024-01-12 NOTE — Patient Instructions (Signed)
 Eustachian Tube Dysfunction  Eustachian tube dysfunction refers to a condition in which a blockage develops in the narrow passage that connects the middle ear to the back of the nose (eustachian tube). The eustachian tube regulates air pressure in the middle ear by letting air move between the ear and nose. It also helps to drain fluid from the middle ear space. Eustachian tube dysfunction can affect one or both ears. When the eustachian tube does not function properly, air pressure, fluid, or both can build up in the middle ear. What are the causes? This condition occurs when the eustachian tube becomes blocked or cannot open normally. Common causes of this condition include: Ear infections. Colds and other infections that affect the nose, mouth, and throat (upper respiratory tract). Allergies. Irritation from cigarette smoke. Irritation from stomach acid coming up into the esophagus (gastroesophageal reflux). The esophagus is the part of the body that moves food from the mouth to the stomach. Sudden changes in air pressure, such as from descending in an airplane or scuba diving. Abnormal growths in the nose or throat, such as: Growths that line the nose (nasal polyps). Abnormal growth of cells (tumors). Enlarged tissue at the back of the throat (adenoids). What increases the risk? You are more likely to develop this condition if: You smoke. You are overweight. You are a child who has: Certain birth defects of the mouth, such as cleft palate. Large tonsils or adenoids. What are the signs or symptoms? Common symptoms of this condition include: A feeling of fullness in the ear. Ear pain. Clicking or popping noises in the ear. Ringing in the ear (tinnitus). Hearing loss. Loss of balance. Dizziness. Symptoms may get worse when the air pressure around you changes, such as when you travel to an area of high elevation, fly on an airplane, or go scuba diving. How is this diagnosed? This  condition may be diagnosed based on: Your symptoms. A physical exam of your ears, nose, and throat. Tests, such as those that measure: The movement of your eardrum. Your hearing (audiometry). How is this treated? Treatment depends on the cause and severity of your condition. In mild cases, you may relieve your symptoms by moving air into your ears. This is called "popping the ears." In more severe cases, or if you have symptoms of fluid in your ears, treatment may include: Medicines to relieve congestion (decongestants). Medicines that treat allergies (antihistamines). Nasal sprays or ear drops that contain medicines that reduce swelling (steroids). A procedure to drain the fluid in your eardrum. In this procedure, a small tube may be placed in the eardrum to: Drain the fluid. Restore the air in the middle ear space. A procedure to insert a balloon device through the nose to inflate the opening of the eustachian tube (balloon dilation). Follow these instructions at home: Lifestyle Do not do any of the following until your health care provider approves: Travel to high altitudes. Fly in airplanes. Work in a Estate agent or room. Scuba dive. Do not use any products that contain nicotine or tobacco. These products include cigarettes, chewing tobacco, and vaping devices, such as e-cigarettes. If you need help quitting, ask your health care provider. Keep your ears dry. Wear fitted earplugs during showering and bathing. Dry your ears completely after. General instructions Take over-the-counter and prescription medicines only as told by your health care provider. Use techniques to help pop your ears as recommended by your health care provider. These may include: Chewing gum. Yawning. Frequent, forceful swallowing.  Closing your mouth, holding your nose closed, and gently blowing as if you are trying to blow air out of your nose. Keep all follow-up visits. This is important. Contact a  health care provider if: Your symptoms do not go away after treatment. Your symptoms come back after treatment. You are unable to pop your ears. You have: A fever. Pain in your ear. Pain in your head or neck. Fluid draining from your ear. Your hearing suddenly changes. You become very dizzy. You lose your balance. Get help right away if: You have a sudden, severe increase in any of your symptoms. Summary Eustachian tube dysfunction refers to a condition in which a blockage develops in the eustachian tube. It can be caused by ear infections, allergies, inhaled irritants, or abnormal growths in the nose or throat. Symptoms may include ear pain or fullness, hearing loss, or ringing in the ears. Mild cases are treated with techniques to unblock the ears, such as yawning or chewing gum. More severe cases are treated with medicines or procedures. This information is not intended to replace advice given to you by your health care provider. Make sure you discuss any questions you have with your health care provider. Document Revised: 09/24/2020 Document Reviewed: 09/24/2020 Elsevier Patient Education  2024 [employer].

## 2024-01-12 NOTE — Progress Notes (Signed)
 Brenda Ray , [birth year], 53 y.o., female MRN: [id number] Patient Care Team    Relationship Specialty Notifications Start End  Mariel Shope, DO PCP - General Family Medicine  05/01/15     Chief Complaint  Patient presents with   Ear Pain    1 week, L ear. No other associated sx. Pt has taken Tylenol .      Subjective: Brenda Ray is a 53 y.o. Pt presents for an OV with complaints of 1 week of left ear pain.  She reports her allergies have been well controlled and she has not needed allergy medicine in a while. Has not used flonase  Denies fever, chills or sinus pressure.     05/19/2023    8:57 AM 02/20/2023    9:29 AM 01/23/2023   12:58 PM 12/01/2022    8:36 AM 08/01/2022   10:05 AM  Depression screen PHQ 2/9  Decreased Interest 0 0 0 0 1  Down, Depressed, Hopeless 0 0 0 0 0  PHQ - 2 Score 0 0 0 0 1  Altered sleeping 0    2  Tired, decreased energy 0    2  Change in appetite 0    0  Feeling bad or failure about yourself  0    0  Trouble concentrating 0    3  Moving slowly or fidgety/restless 0    2  Suicidal thoughts 0    0  PHQ-9 Score 0    10  Difficult doing work/chores Not difficult at all        Allergies  Allergen Reactions   Augmentin  [Amoxicillin -Pot Clavulanate] Diarrhea   Doxycycline Nausea And Vomiting   Hydrocodone  Itching   Social History   Social History Narrative   Lives with Son and significant other. Engaged to be married.   Bus Arts administrator at [employer]   Every day smoker   No etoh. No drugs. Wears her seatbelt.    Does not exercise daily.    Firearms present in the home in a gun safe.   Smoke alarm in the home.    Feel safe in relationships.    Past Medical History:  Diagnosis Date   Abnormal Pap smear of cervix    age 18   Anemia    Anxiety    use to use / take Lorazepam  ., last episode of panic  2 yrs. ago   Arthritis    hands, knees, back - Osteo   Colon polyps    she reports colon polyps about 20 years  ago; she was told 5 yr follow up.    Complication of anesthesia    woke up before the tube was removed    Cystitis    Facial nerve disorder    GERD (gastroesophageal reflux disease)    Hiatal hernia    reports seen on [facility] completed in her 20s [facility]. Completed for pain and FOBT +.    History of chicken pox    Pneumonia    as a child- hosp.    Vaginal delivery    x2   Past Surgical History:  Procedure Laterality Date   ANTERIOR CERVICAL DECOMP/DISCECTOMY FUSION  2017   C3-C5   BREAST BIOPSY Left 2015   CERVICAL DISCECTOMY  2014   CHOLECYSTECTOMY N/A 06/16/2014   Procedure: LAPAROSCOPIC CHOLECYSTECTOMY ;  Surgeon: Oza Blumenthal, MD;  Location: [facility] OR;  Service: General;  Laterality: N/A;  laparoscopic cholecystectomy  GYNECOLOGIC CRYOSURGERY     age 46   LAPAROSCOPIC ABDOMINAL EXPLORATION     for endometrimosis    Family History  Problem Relation Age of Onset   Arthritis Mother    Hyperlipidemia Mother    Hypertension Mother    Thyroid  disease Mother        hypothyroid   Arthritis Father    Hyperlipidemia Father    Hypertension Father    Bladder Cancer Father    Prostate cancer Father    Thyroid  disease Brother    Arthritis Maternal Grandmother    Arthritis Maternal Grandfather    Arthritis Paternal Grandmother    Arthritis Paternal Grandfather    Breast cancer Maternal Aunt 50   Allergies as of 01/12/2024       Reactions   Augmentin  [amoxicillin -pot Clavulanate] Diarrhea   Doxycycline Nausea And Vomiting   Hydrocodone  Itching        Medication List        Accurate as of January 12, 2024 10:50 AM. If you have any questions, ask your nurse or doctor.          amphetamine -dextroamphetamine  20 MG tablet Commonly known as: ADDERALL Take 1 tablet (20 mg total) by mouth 2 (two) times daily with a meal.   amphetamine -dextroamphetamine  20 MG tablet Commonly known as: ADDERALL Take 1 tablet (20 mg total) by mouth 2 (two) times daily.    amphetamine -dextroamphetamine  20 MG tablet Commonly known as: ADDERALL Take 1 tablet (20 mg total) by mouth 2 (two) times daily with a meal.   fluticasone  50 MCG/ACT nasal spray Commonly known as: FLONASE  Place 2 sprays into both nostrils daily.   meloxicam  15 MG tablet Commonly known as: MOBIC  Take 1 tablet (15 mg total) by mouth daily.   pantoprazole  40 MG tablet Commonly known as: PROTONIX  Take 1 tablet (40 mg total) by mouth daily.   venlafaxine  XR 150 MG 24 hr capsule Commonly known as: Effexor  XR Take 1 capsule (150 mg total) by mouth daily with breakfast.   Vitamin D -3 25 MCG (1000 UT) Caps Take by mouth.        All past medical history, surgical history, allergies, family history, immunizations andmedications were updated in the EMR today and reviewed under the history and medication portions of their EMR.     ROS Negative, with the exception of above mentioned in HPI   Objective:  BP 122/78   Pulse 88   Temp 98.3 F (36.8 C)   Wt 187 lb (84.8 kg)   LMP 04/17/2019 (Exact Date)   SpO2 97%   BMI 30.18 kg/m  Body mass index is 30.18 kg/m. Physical Exam Vitals and nursing note reviewed.  Constitutional:      General: She is not in acute distress.    Appearance: Normal appearance. She is normal weight. She is not ill-appearing or toxic-appearing.  HENT:     Head: Normocephalic and atraumatic.     Right Ear: Tympanic membrane, ear canal and external ear normal. There is no impacted cerumen.     Left Ear: Tympanic membrane, ear canal and external ear normal. There is no impacted cerumen.     Ears:     Comments: Mild effusion present left. No erythema or bulging.   Eyes:     General: No scleral icterus.       Right eye: No discharge.        Left eye: No discharge.     Extraocular Movements: Extraocular movements intact.  Conjunctiva/sclera: Conjunctivae normal.     Pupils: Pupils are equal, round, and reactive to light.    Skin:    Findings: No  rash.   Neurological:     Mental Status: She is alert and oriented to person, place, and time. Mental status is at baseline.     Motor: No weakness.     Coordination: Coordination normal.     Gait: Gait normal.   Psychiatric:        Mood and Affect: Mood normal.        Behavior: Behavior normal.        Thought Content: Thought content normal.        Judgment: Judgment normal.      No results found. No results found. No results found for this or any previous visit (from the past 24 hours).  Assessment/Plan: Brenda Ray is a 53 y.o. female present for OV for  Eustachian tube dysfunction, left (Primary) Ear exam normal, with exception of mild effusion.  OTC allerga med Restart flonase  nasal spray F/u prn   Reviewed expectations re: course of current medical issues. Discussed self-management of symptoms. Outlined signs and symptoms indicating need for more acute intervention. Patient verbalized understanding and all questions were answered. Patient received an After-Visit Summary.    No orders of the defined types were placed in this encounter.  Meds ordered this encounter  Medications   fluticasone  (FLONASE ) 50 MCG/ACT nasal spray    Sig: Place 2 sprays into both nostrils daily.    Dispense:  16 g    Refill:  6   Referral Orders  No referral(s) requested today     Note is dictated utilizing voice recognition software. Although note has been proof read prior to signing, occasional typographical errors still can be missed. If any questions arise, please do not hesitate to call for verification.   electronically signed by:  Napolean Backbone, DO  [facility] [place]

## 2024-01-30 ENCOUNTER — Other Ambulatory Visit: Payer: Self-pay | Admitting: Family Medicine

## 2024-02-09 ENCOUNTER — Encounter: Payer: Self-pay | Admitting: Family Medicine

## 2024-02-09 ENCOUNTER — Ambulatory Visit: Admitting: Family Medicine

## 2024-02-09 VITALS — BP 119/76 | HR 89 | Temp 98.2°F | Wt 188.0 lb

## 2024-02-09 DIAGNOSIS — F419 Anxiety disorder, unspecified: Secondary | ICD-10-CM

## 2024-02-09 DIAGNOSIS — R41840 Attention and concentration deficit: Secondary | ICD-10-CM | POA: Diagnosis not present

## 2024-02-09 DIAGNOSIS — N951 Menopausal and female climacteric states: Secondary | ICD-10-CM

## 2024-02-09 MED ORDER — MELOXICAM 15 MG PO TABS: 15.0000 mg | ORAL_TABLET | Freq: Every day | ORAL | 1 refills | Status: AC

## 2024-02-09 MED ORDER — VENLAFAXINE HCL ER 150 MG PO CP24: 150.0000 mg | ORAL_CAPSULE | Freq: Every day | ORAL | 1 refills | Status: DC

## 2024-02-09 MED ORDER — AMPHETAMINE-DEXTROAMPHETAMINE 20 MG PO TABS: 20.0000 mg | ORAL_TABLET | Freq: Two times a day (BID) | ORAL | 0 refills | Status: AC

## 2024-02-09 MED ORDER — BUSPIRONE HCL 10 MG PO TABS: 10.0000 mg | ORAL_TABLET | Freq: Two times a day (BID) | ORAL | 1 refills | Status: AC

## 2024-02-09 NOTE — Progress Notes (Signed)
 Patient Care Team    Relationship Specialty Notifications Start End  Catherine Charlies LABOR, DO PCP - General Family Medicine  05/01/15     SUBJECTIVE Chief Complaint  Patient presents with   Medication Management    Pt wanting to discuss possible dosage change for Effexor . Increased anxiety and sleep disturbance.    HPI: Brenda Ray is a 53 y.o. female present for [facility] follow up after start of new medication  Anxiety/hot flashes/fatigue/attention: Patient reports she believes the Effexor  150 mg is working not well for her.  She is no longer taking the BuSpar .  She had stopped this med when effexor  was increased. She has noticed more anxiety lately. There are some issues with family dynamics that has created increase stress. She feels the Adderall 20 mg BID is working well.   Prior note: She states she does feel shaky in the morning before she takes her medicines.  When she takes her medicine she feels her anxiety is well-controlled.  She does complain of having difficulty with brain fog and focus now.  She states her long-term memory seems to be declining.  She reports compliance with her B12 and vitamin D . Patient reports her anxiety is starting to increase a small amount.  She states initially she thought the Effexor  75 mg daily and the BuSpar  twice daily was working great, but now she started to get that anxious shaky feeling again and thinks maybe she should go up on the Effexor .  She thinks the vitamin supplements have helped with the fatigue.  She also is using the Atarax  on occasions and feels it is working well for her. Tried: paxil  (sleepy), ativan  (sleepy at low doses)  Shoulder pain: She is she has ccontinued to use the meloxicam  with flares of her shoulder pain.  Meloxicam  works well for her as needed  GERD:she is compliant  with protonix  40 mg qd.   Allergies: Patient is compliant  with Zyrtec  nightly and Flonase  nasal spray daily, this combination seems to work well for  her.  Review of Systems  All other systems reviewed and are negative.      02/09/2024    1:59 PM 05/19/2023    8:57 AM 02/20/2023    9:29 AM 01/23/2023   12:58 PM 12/01/2022    8:36 AM  Depression screen PHQ 2/9  Decreased Interest 1 0 0 0 0  Down, Depressed, Hopeless 0 0 0 0 0  PHQ - 2 Score 1 0 0 0 0  Altered sleeping 0 0     Tired, decreased energy 1 0     Change in appetite 1 0     Feeling bad or failure about yourself  0 0     Trouble concentrating 1 0     Moving slowly or fidgety/restless 1 0     Suicidal thoughts 0 0     PHQ-9 Score 5 0     Difficult doing work/chores Somewhat difficult Not difficult at all         02/09/2024    1:59 PM 05/19/2023    8:57 AM 01/23/2023   12:59 PM 08/01/2022   10:05 AM  GAD 7 : Generalized Anxiety Score  Nervous, Anxious, on Edge 1 0 0 1  Control/stop worrying 1 0 0 0  Worry too much - different things 1 0 0 1  Trouble relaxing 0 0 3 0  Restless 0 0 0 1  Easily annoyed or irritable 0 0 0 1  Afraid - awful might happen 1 0 0 0  Total GAD 7 Score 4 0 3 4  Anxiety Difficulty Somewhat difficult  Very difficult     Patient Active Problem List   Diagnosis Date Noted   Attention and concentration deficit 09/05/2022   Obesity with serious comorbidity 10/08/2021   Allergic rhinitis 08/29/2021   GERD without esophagitis 08/22/2020   Chronic shoulder pain 08/02/2018   Benzodiazepine contract exists 10/05/2017   Anxiety 10/02/2017   Hot flashes due to menopause 10/02/2017   Stress incontinence 10/01/2015    Social History   Tobacco Use   Smoking status: Former    Current packs/day: 0.00    Average packs/day: 0.5 packs/day for 36.0 years (18.0 ttl pk-yrs)    Types: Cigarettes, E-cigarettes    Start date: 02/25/1981    Quit date: 02/25/2017    Years since quitting: 6.9   Smokeless tobacco: Former  Substance Use Topics   Alcohol use: Not Currently    Current Outpatient Medications:    busPIRone  (BUSPAR ) 10 MG tablet, Take 1 tablet  (10 mg total) by mouth 2 (two) times daily., Disp: 180 tablet, Rfl: 1   Cholecalciferol (VITAMIN D -3) 25 MCG (1000 UT) CAPS, Take by mouth., Disp: , Rfl:    fluticasone  (FLONASE ) 50 MCG/ACT nasal spray, Place 2 sprays into both nostrils daily., Disp: 16 g, Rfl: 6   pantoprazole  (PROTONIX ) 40 MG tablet, Take 1 tablet (40 mg total) by mouth daily., Disp: 90 tablet, Rfl: 1   amphetamine -dextroamphetamine  (ADDERALL) 20 MG tablet, Take 1 tablet (20 mg total) by mouth 2 (two) times daily., Disp: 60 tablet, Rfl: 0   [START ON 03/03/2024] amphetamine -dextroamphetamine  (ADDERALL) 20 MG tablet, Take 1 tablet (20 mg total) by mouth 2 (two) times daily with a meal., Disp: 60 tablet, Rfl: 0   [START ON 04/05/2024] amphetamine -dextroamphetamine  (ADDERALL) 20 MG tablet, Take 1 tablet (20 mg total) by mouth 2 (two) times daily with a meal., Disp: 60 tablet, Rfl: 0   meloxicam  (MOBIC ) 15 MG tablet, Take 1 tablet (15 mg total) by mouth daily., Disp: 90 tablet, Rfl: 1   venlafaxine  XR (EFFEXOR -XR) 150 MG 24 hr capsule, Take 1 capsule (150 mg total) by mouth daily with breakfast., Disp: 90 capsule, Rfl: 1  Allergies  Allergen Reactions   Augmentin  [Amoxicillin -Pot Clavulanate] Diarrhea   Doxycycline Nausea And Vomiting   Hydrocodone  Itching    OBJECTIVE: BP 119/76   Pulse 89   Temp 98.2 F (36.8 C)   Wt 188 lb (85.3 kg)   LMP 04/17/2019 (Exact Date)   SpO2 97%   BMI 30.34 kg/m  Physical Exam Vitals and nursing note reviewed.  Constitutional:      General: She is not in acute distress.    Appearance: Normal appearance. She is not toxic-appearing.  HENT:     Head: Normocephalic and atraumatic.  Eyes:     General: No scleral icterus.       Right eye: No discharge.        Left eye: No discharge.     Conjunctiva/sclera: Conjunctivae normal.  Pulmonary:     Effort: Pulmonary effort is normal.  Musculoskeletal:     Cervical back: Normal range of motion.  Skin:    Findings: No rash.  Neurological:      Mental Status: She is alert and oriented to person, place, and time. Mental status is at baseline.  Psychiatric:        Mood and Affect: Mood normal.  Behavior: Behavior normal.        Thought Content: Thought content normal.        Judgment: Judgment normal.     ASSESSMENT AND PLAN: Brenda Ray is a 53 y.o. female present for  Memory changes/fatigue/attention deficit Stable Continue adderall 20 bid .[employer] may consider long acting if covered by new insurance.  [place]  controlled substance database reviewed and appropriate 02/09/24  Chronic pain of left shoulder Stable Continue  Mobic  15 mg daily. prn  Anxiety/Hot flashes due to menopauseBenzodiazepine contract exists/panic Stable continue Effexor  150 mg daily Add back buspar  10 mg BID (taper to dose) Tried: celexa .  BuSpar  Follow-up 5.5 months     Return in about 15 weeks (around 05/24/2024) for cpe (20 min), Routine chronic condition follow-up.   No orders of the defined types were placed in this encounter.  Meds ordered this encounter  Medications   venlafaxine  XR (EFFEXOR -XR) 150 MG 24 hr capsule    Sig: Take 1 capsule (150 mg total) by mouth daily with breakfast.    Dispense:  90 capsule    Refill:  1   busPIRone  (BUSPAR ) 10 MG tablet    Sig: Take 1 tablet (10 mg total) by mouth 2 (two) times daily.    Dispense:  180 tablet    Refill:  1   meloxicam  (MOBIC ) 15 MG tablet    Sig: Take 1 tablet (15 mg total) by mouth daily.    Dispense:  90 tablet    Refill:  1   amphetamine -dextroamphetamine  (ADDERALL) 20 MG tablet    Sig: Take 1 tablet (20 mg total) by mouth 2 (two) times daily.    Dispense:  60 tablet    Refill:  0   amphetamine -dextroamphetamine  (ADDERALL) 20 MG tablet    Sig: Take 1 tablet (20 mg total) by mouth 2 (two) times daily with a meal.    Dispense:  60 tablet    Refill:  0   amphetamine -dextroamphetamine  (ADDERALL) 20 MG tablet    Sig: Take 1 tablet (20 mg total) by mouth 2  (two) times daily with a meal.    Dispense:  60 tablet    Refill:  0   Referral Orders  No referral(s) requested today    Charlies Bellini, DO 02/09/2024

## 2024-02-09 NOTE — Patient Instructions (Signed)

## 2024-02-12 ENCOUNTER — Ambulatory Visit: Admitting: Family Medicine

## 2024-09-01 ENCOUNTER — Encounter: Payer: Self-pay | Admitting: Family Medicine

## 2024-09-01 MED ORDER — VENLAFAXINE HCL ER 150 MG PO CP24: 150.0000 mg | ORAL_CAPSULE | Freq: Every day | ORAL | 1 refills | Status: AC

## 2024-09-09 ENCOUNTER — Ambulatory Visit: Payer: Self-pay | Admitting: Family Medicine
